# Patient Record
Sex: Female | Born: 1950 | Race: White
Health system: Northeastern US, Academic
[De-identification: ages and names within clinical notes are randomized; demographics above are authoritative.]

## PROBLEM LIST (undated history)

## (undated) DIAGNOSIS — I10 Essential (primary) hypertension: Secondary | ICD-10-CM

## (undated) DIAGNOSIS — M1990 Unspecified osteoarthritis, unspecified site: Secondary | ICD-10-CM

## (undated) DIAGNOSIS — G4730 Sleep apnea, unspecified: Secondary | ICD-10-CM

## (undated) DIAGNOSIS — E7800 Pure hypercholesterolemia, unspecified: Secondary | ICD-10-CM

## (undated) DIAGNOSIS — E079 Disorder of thyroid, unspecified: Secondary | ICD-10-CM

## (undated) DIAGNOSIS — M797 Fibromyalgia: Secondary | ICD-10-CM

## (undated) HISTORY — PX: BREAST BIOPSY: SHX20

## (undated) HISTORY — PX: CARPAL TUNNEL RELEASE: SHX101

## (undated) HISTORY — PX: MEDIAL PARTIAL KNEE REPLACEMENT: SHX5965

## (undated) HISTORY — PX: ABDOMINAL HYSTERECTOMY: SHX81

## (undated) HISTORY — PX: BUNIONECTOMY: SHX129

## (undated) HISTORY — PX: HAND SURGERY: SHX662

## (undated) HISTORY — PX: OTHER SURGICAL HISTORY: SHX169

## (undated) HISTORY — PX: TONSILLECTOMY: SUR1361

## (undated) HISTORY — PX: REPLACEMENT TOTAL KNEE: SUR1224

---
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
## 1971-07-30 HISTORY — PX: OTHER SURGICAL HISTORY: SHX169

## 1999-07-30 HISTORY — PX: BLADDER REPAIR: SHX76

## 2008-02-15 DIAGNOSIS — I6529 Occlusion and stenosis of unspecified carotid artery: Secondary | ICD-10-CM | POA: Insufficient documentation

## 2008-06-22 ENCOUNTER — Encounter: Payer: Self-pay | Admitting: Cardiology

## 2011-05-29 ENCOUNTER — Encounter: Payer: Self-pay | Admitting: Orthopedic Surgery

## 2011-05-29 ENCOUNTER — Ambulatory Visit: Payer: Self-pay

## 2011-05-29 ENCOUNTER — Ambulatory Visit: Payer: Self-pay | Admitting: Orthopedic Surgery

## 2011-05-29 VITALS — BP 136/84 | Ht 64.0 in | Wt 215.0 lb

## 2011-05-29 DIAGNOSIS — M79673 Pain in unspecified foot: Secondary | ICD-10-CM

## 2011-05-29 DIAGNOSIS — M19079 Primary osteoarthritis, unspecified ankle and foot: Secondary | ICD-10-CM | POA: Insufficient documentation

## 2011-05-29 NOTE — Progress Notes (Signed)
This office note has been dictated.

## 2011-05-30 NOTE — Progress Notes (Signed)
Patient fitted with size 11 lynco inserts and left carbon fiber footplate.

## 2011-05-30 NOTE — H&P (Signed)
PATIENT'S PROBLEM:  Left midfoot pain.    HISTORY OF PRESENT ILLNESS:  Patient is a 60 year old woman, a patient of  Dr. Consuelo Pandy, who has severe pain in both feet, the left worse  than the right, primarily in the area of the midfoot.  She has noted a  progressive flat-foot deformity on the left side.  She has sharp, burning  pain.  She also gets some numbness into her toes.  She describes the pain  as 7/10 now and at its worst 10/10.  She has tried ice, nonsteroidal  antiinflammatories, and physical therapy exercises.  This has been coming  on for the last year.    HISTORY:  Her past medical history, medications, surgeries, allergies,  social history, and family history were all reviewed with the patient and  included in the patient's chart.    PHYSICAL EXAMINATION:  Patient is neurovascularly intact.  Her maximal area  of tenderness is in the left tarsometatarsal joint.  She has pes planus  foot architecture, with some collapse to the midfoot.  On the right side  she has similar location of tenderness, although the flatfoot deformity is  not as severe.  Her ankle motion appears relatively well maintained.  She  does not have a post Silfverskiold test.  Her strength is 5/5 in bilateral  extremities in all major muscle groups.    IMAGING:  X-rays of her left foot demonstrate midfoot arthritis, as do the  right foot.    ASSESSMENT:  Left and right midfoot arthritis:  I am going to try a carbon  footplate on her left side, as well as Lynco on both sides, and see if that  helps her.  I will see her back in 6 weeks.                    Electronically Signed and Finalized by  Zachary George, MD 06/17/2011 16:39  ___________________________________________  Zachary George, MD  DD:   05/29/2011  DT:   05/30/2011 11:25 A  RUE/AV#[id number]  [id number]    cc:   Ron Agee, MD

## 2011-07-11 ENCOUNTER — Encounter: Payer: Self-pay | Admitting: Orthopedic Surgery

## 2011-07-11 ENCOUNTER — Ambulatory Visit: Payer: Self-pay | Admitting: Orthopedic Surgery

## 2011-07-11 VITALS — BP 162/80 | HR 87 | Ht 64.0 in | Wt 215.0 lb

## 2011-07-11 DIAGNOSIS — G629 Polyneuropathy, unspecified: Secondary | ICD-10-CM

## 2011-07-11 DIAGNOSIS — M19079 Primary osteoarthritis, unspecified ankle and foot: Secondary | ICD-10-CM

## 2011-07-11 MED ORDER — LIDOCAINE HCL 2 % EX JELLY WRAPPED *I*
CUTANEOUS | Status: AC | PRN
Start: 2011-07-11 — End: ?

## 2011-07-11 MED ORDER — PREGABALIN 75 MG PO CAPS *A*
75.0000 mg | ORAL_CAPSULE | Freq: Two times a day (BID) | ORAL | Status: AC
Start: 2011-07-11 — End: 2011-08-10

## 2012-01-15 DIAGNOSIS — I10 Essential (primary) hypertension: Secondary | ICD-10-CM | POA: Insufficient documentation

## 2012-10-10 DIAGNOSIS — E785 Hyperlipidemia, unspecified: Secondary | ICD-10-CM | POA: Insufficient documentation

## 2012-10-10 DIAGNOSIS — K219 Gastro-esophageal reflux disease without esophagitis: Secondary | ICD-10-CM | POA: Insufficient documentation

## 2013-10-18 ENCOUNTER — Encounter: Payer: Self-pay | Admitting: Gastroenterology

## 2013-11-22 ENCOUNTER — Other Ambulatory Visit: Payer: Self-pay | Admitting: Gastroenterology

## 2013-11-22 ENCOUNTER — Encounter: Payer: Self-pay | Admitting: Orthopedic Surgery

## 2013-11-22 ENCOUNTER — Ambulatory Visit: Payer: Self-pay | Admitting: Orthopedic Surgery

## 2013-11-22 VITALS — BP 138/62 | HR 80 | Ht 64.0 in | Wt 210.0 lb

## 2013-11-22 DIAGNOSIS — M76822 Posterior tibial tendinitis, left leg: Secondary | ICD-10-CM

## 2013-11-22 DIAGNOSIS — M76829 Posterior tibial tendinitis, unspecified leg: Secondary | ICD-10-CM | POA: Insufficient documentation

## 2013-11-22 NOTE — Progress Notes (Signed)
Walk-in Visit    Patient name: Rachel House     There were no vitals filed for this visit.    Laterality: left    Device:  [place]port Ankle    Functional Goals: Immobilization/Reduce joint motion    ROM settings: Not Applicable    Expected Frequency / Duration of Use:   Per physician orders    Brand: aircast                 Model: Not Applicable    Size: medium    Modifications made: Not Applicable    Complexity:   Fitting was routine, requiring little to no adjustments    Yes Device was inspected for safety/security and found to be functioning properly    Ordered/Delivered: Device Delivered    The patient given verbal instructions.  The following Instructions were reviewed:   Purpose of device, Cleaning / Care of device, Potential Risks / Benefits, Frequency / Duration of use and How to report potential failure / malfunctions    Device Comfort Score: NA

## 2013-11-23 ENCOUNTER — Telehealth: Payer: Self-pay

## 2013-11-23 DIAGNOSIS — M25579 Pain in unspecified ankle and joints of unspecified foot: Secondary | ICD-10-CM

## 2013-11-23 NOTE — Telephone Encounter (Signed)
Please sign RX for LT AIRSPORT ANKLE BRACE [phone]

## 2013-11-23 NOTE — Progress Notes (Signed)
Rachel Clay:   House, Rachel House MR #:  [id number]   ACCOUNT #:  [id number] DOB:  1950-11-29   DICTATED BY:  Zachary GeorgeJudith F Evita Merida, MD DATE OF VISIT:  11/22/2013     PROBLEM:  Left posterior tibial tendonitis.    HISTORY OF PRESENT ILLNESS:  The patient is a day 63 year old woman who was previously seen in 2013 for bilateral midfoot arthritis and peripheral neuropathy. Was down in [place] and had progressive pain in the region of her left medial ankle.  She was seen by a podiatrist who sent her to get an MRI which documented a posterior tibial tendon degenerative tear.  She is treated with some strapping of her foot and ankle as she did not want any surgery done in [place] and returns here for evaluation.  Her past medical history, medications, surgeries, allergies, social history, and family history are all reviewed with the patient and included in the patient's chart.    PHYSICAL EXAMINATION:  She has a swollen tender area of her posterior tib tendon.  She is not able to actively invert.  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  She has minimal tenderness to palpation in the sinus tarsi.  Her ankle motion is well maintained.    IMAGING:  Review of patient's left MRI that the patient brought with her demonstrates a degenerative tear of the posterior tib tendon.    ASSESSMENT:  Left posterior tibial tendonitis with a degenerative tear:  I told the patient we could try organizing her with an ankle brace to see if would be more comfortable and I will see her back in a month.  If we find that the ankle brace by itself is more comfortable to her and it helps with some of the swelling, then we can utilize it for a couple more months and then transition into a UCBL-ish type orthotic.  If she finds that the brace itself is not comfortable enough, we may want to consider either an alternative brace wear or surgical intervention and it may in fact be a triple arthrodesis.  I did not get into the  surgical options with this patient as she has not really exhausted her nonoperative options at this time and we will see her back in a month.             ______________________________  Zachary GeorgeJudith F Jeziah Kretschmer, MD    JFB/MODL  DD:  11/22/2013 18:24:02  DT:  11/23/2013 04:54:[id number]:38:[id number]  Job #:  [id number]    cc: Zachary GeorgeJudith F Jermell Holeman, MD   Ron Ageeichard F Mittereder, MD   [address]   [place], [place] [id number]

## 2014-01-06 ENCOUNTER — Ambulatory Visit: Payer: Self-pay | Admitting: Orthopedic Surgery

## 2014-01-06 ENCOUNTER — Encounter: Payer: Self-pay | Admitting: Orthopedic Surgery

## 2014-01-06 VITALS — BP 166/90 | Ht 64.0 in | Wt 210.0 lb

## 2014-01-06 DIAGNOSIS — M19079 Primary osteoarthritis, unspecified ankle and foot: Secondary | ICD-10-CM

## 2014-01-06 DIAGNOSIS — M76822 Posterior tibial tendinitis, left leg: Secondary | ICD-10-CM

## 2014-01-06 NOTE — Progress Notes (Signed)
Patient was seen in clinic today and molded for a left modified UCBL for PTTI.  This was done without incident.  She was instructed on shoe wear, cost and fabrication time of the orthotic.  She wishes to proceed at this time.  She will benefit from the increased support that the orthotic will provide.    Follow up in three weeks for fit and delivery of foot orthotic.    All questions were answered.

## 2014-01-07 ENCOUNTER — Telehealth: Payer: Self-pay

## 2014-01-07 DIAGNOSIS — M25579 Pain in unspecified ankle and joints of unspecified foot: Secondary | ICD-10-CM

## 2014-01-07 NOTE — Progress Notes (Signed)
PROBLEM:  Left posterior tibial tendonitis.    HISTORY OF PRESENT ILLNESS:  The patient is a day 63 year old woman who was previously seen in 2013 for bilateral midfoot arthritis and peripheral neuropathy. Was down in [place] and had progressive pain in the region of her left medial ankle.  She was seen by a podiatrist who sent her to get an MRI which documented a posterior tibial tendon degenerative tear.  She is treated with some strapping of her foot and ankle as she did not want any surgery done in [place] and returns here for evaluation. Continuing to have left medial ankle pain.      PHYSICAL EXAMINATION:  She has a swollen tender area of her posterior tib tendon.  She is not able to actively invert.  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  She has minimal tenderness to palpation in the sinus tarsi.  Her ankle motion is well maintained.    IMAGING:  Review of patient's left MRI that the patient brought with her demonstrates a degenerative tear of the posterior tib tendon.    ASSESSMENT:  Left posterior tibial tendonitis with a degenerative tear:  The patient reports the ankle brace, did not make any difference with her pain. May need to try aUCBL-ish type orthotic. Discussed with The patient about her surgical options such as a triple arthrodesis.  Risks and benefits, reviewed with patient.  She will Call the office if she wants to pursue the surgical option.

## 2014-01-07 NOTE — Telephone Encounter (Signed)
Left FO, plastizote or equal, ea  Dx [phone]

## 2014-01-11 ENCOUNTER — Emergency Department
Admission: EM | Admit: 2014-01-11 | Discharge: 2014-01-11 | Disposition: A | Discharge status: Home / Self Care | Payer: Self-pay | Source: Ambulatory Visit | Attending: Emergency Medicine | Admitting: Emergency Medicine

## 2014-01-11 DIAGNOSIS — IMO0002 Reserved for concepts with insufficient information to code with codable children: Secondary | ICD-10-CM

## 2014-01-11 DIAGNOSIS — S76319A Strain of muscle, fascia and tendon of the posterior muscle group at thigh level, unspecified thigh, initial encounter: Secondary | ICD-10-CM

## 2014-01-11 HISTORY — DX: Unspecified osteoarthritis, unspecified site: M19.90

## 2014-01-11 HISTORY — DX: Disorder of thyroid, unspecified: E07.9

## 2014-01-11 HISTORY — DX: Essential (primary) hypertension: I10

## 2014-01-11 HISTORY — DX: Pure hypercholesterolemia, unspecified: E78.00

## 2014-01-11 HISTORY — DX: Fibromyalgia: M79.7

## 2014-01-11 LAB — HM HIV SCREENING OFFERED

## 2014-01-11 NOTE — ED Notes (Addendum)
Plan of Care: Received pt with complaint as noted by Triage.  Will monitor for VS stability and pain assessment as appropriate.  Will work with provider and pt (and family) toward suitable discharge plan.    Pt presents with muscular pain in right hamstring after fall this morning while walking dog.  No obvious injury; minor swelling bu comparison to left.  Pt is mostly concerned about the possibility of a blood clot only because she has poor vasculature; no history of clots or anticoags.

## 2014-01-11 NOTE — ED Provider Notes (Signed)
History     Chief Complaint   Patient presents with    Leg Pain       HPI Comments: 63 year old female, walking her 7lb dog when she slipped on wood in the backyard. She slipped with her right leg extended out. Suddenly felt extreme pain in her posterior medial thigh. Did not hit any objects.  Did not hit head or lose consciousness.  Denies chest pain or shortness of breath.  No abdominal pain.  After falling became slightly nauseous and lightheaded which she attributes to pain.  She did not have the symptoms prior to her fall.  No numbness or tingling in her foot.  Took 800 mg ibuprofen prior to arrival and does not want any narcotic pain medications at the present time.      History provided by:  Patient and medical records      Past Medical History   Diagnosis Date    Arthritis     Hypertension     Thyroid disease     High cholesterol     Fibromyalgia             Past Surgical History   Procedure Laterality Date    Hysterectomy      Bladder repair      Knee replacement Left     Feet Bilateral        History reviewed. No pertinent family history.      Social History      reports that she has never smoked. She does not have any smokeless tobacco history on file. She reports that she drinks alcohol. She reports that she does not use illicit drugs. Her sexual activity history is not on file.    Living Situation    Questions Responses    Patient lives with     Homeless     Caregiver for other family member     External Services     Employment     Domestic Violence Risk           Problem List     Patient Active Problem List   Diagnosis Code    Arthritis of foot 716.97    Posterior tibial tendonitis 726.72       Review of Systems   Review of Systems   Constitutional: Negative for fever and chills.   HENT: Negative for trouble swallowing.    Respiratory: Negative for shortness of breath.    Cardiovascular: Negative for chest pain.   Gastrointestinal: Positive for nausea. Negative for vomiting, abdominal  pain and abdominal distention.   Genitourinary: Negative for flank pain.   Musculoskeletal: Negative for back pain and neck pain.        Right thigh pain posteriorly   Skin: Negative for wound.   Neurological: Negative for syncope.       Physical Exam     ED Triage Vitals   BP Heart Rate Heart Rate(via Pulse Ox) Resp Temp Temp Source SpO2 O2 Device O2 Flow Rate   01/11/14 1204 01/11/14 1204 -- 01/11/14 1204 01/11/14 1204 01/11/14 1204 01/11/14 1204 01/11/14 1204 --   141/62 mmHg 102  18 36.5 C (97.7 F) TEMPORAL 97 % None (Room air)       Weight           01/11/14 1204           95.255 kg (210 lb)               Physical Exam  Constitutional: She is oriented to person, place, and time. She appears well-developed and well-nourished. No distress.   HENT:   Head: Normocephalic and atraumatic.   Eyes: Pupils are equal, round, and reactive to light. No scleral icterus.   Neck: No tracheal deviation present.   Cardiovascular: Normal rate and regular rhythm.    Palpable DP and PT in bilateral lower extremities.  Palpable radial pulses bilateral upper extremities.   Pulmonary/Chest: Effort normal and breath sounds normal. No respiratory distress. She has no wheezes. She has no rales.   Abdominal: Soft. Bowel sounds are normal. She exhibits no distension. There is no tenderness. There is no rebound and no guarding.   Musculoskeletal:   No gross bony deformities.  No deformities or tenderness to palpation from the knees distally bilaterally.  There is tenderness to palpation in the posterior medial aspect of the thigh.  The area which is tender is not firm.   Neurological: She is alert and oriented to person, place, and time.   No sensation deficits in the lower extremities.  Able to flex and extend with normal strength in bilateral lower extremities at the knee.   Skin: She is not diaphoretic.   Nursing note and vitals reviewed.      Medical Decision Making      Amount and/or Complexity of Data Reviewed  Tests in the  radiology section of CPT: ordered and reviewed  Independent visualization of images, tracings, or specimens: yes        Initial Evaluation:  ED First Provider Contact    Date/Time Event User Comments    01/11/14 1209 ED Provider First Contact Edy Belt Initial Face to Face Provider Contact          Patient seen by me as above    Assessment:  63 y.o., female comes to the ED with history of a mechanical fall in the backyard with pain in the posterior aspect of her right thigh and no other injuries noted by physical exam.  CMS is intact.  Unlikely to have intracranial spinal injury.  Currently hemodynamically stable.    Differential Diagnosis includes femur fracture, hip fracture, muscle strain, soft tissue hematoma                Plan:   - X-ray of the right femur and right hip  - Pain medications will be administered once patient asks for them.  She refused them when I initially offered.    Noberto RetortJoseph Stace Peace, MD                Noberto RetortHayek, Angelamarie Avakian, MD  01/11/14 [id number]

## 2014-01-11 NOTE — ED Notes (Signed)
Pt states "   I fell at 0930 in my yard, I had slippers on and slipped and my right leg went under me."  C/ o pain in right posterior upper leg.  Pt is walking with cane d/t this fall.

## 2014-01-11 NOTE — ED Provider Progress Notes (Signed)
ED Provider Progress Note    No hip or femur fracture. Patient able to tolerate weight on leg despite pain. Symptoms consistent with sprain/strain or hematoma. Currently no sign of compartment syndrome. Will discharge home       Noberto RetortJoseph Romona Murdy, MD, 01/11/2014, 1:16 PM

## 2014-02-03 ENCOUNTER — Ambulatory Visit: Payer: Self-pay

## 2014-02-03 DIAGNOSIS — M76822 Posterior tibial tendinitis, left leg: Secondary | ICD-10-CM

## 2014-02-03 DIAGNOSIS — M79673 Pain in unspecified foot: Secondary | ICD-10-CM

## 2014-02-03 NOTE — Patient Instructions (Signed)
Foot Orthosis Wearing Instructions:    These instructions are intended to help ensure successful use of your Foot Orthosis (FO). Please read and follow these instructions carefully.  While being fitted with your FO, your Orthotist will instruct you on how to wear and use it effectively.  Some points to remember:    Socks:   1. Always wear clean, non-elastic cotton socks with your shoes.  Shoes  1. Appropriate shoes are vital to the success of your inserts.   Wear only shoe with Laces (preferred) or velcro closures: no loafers or slip-ons.  Make sure laces are comfortably snug to anchor your feet to the shoe so that they work together as one unit.  2. It may be necessary to get a larger shoe, anywhere from ½ to a 1½ size larger than your current shoe to accommodate the increased thickness of the insert.    3. Running shoes with a mild rocker bottom and/or wide shoes with a high toe box, and/or removable insoles are optimal.  If your shoes need to have modifications to the sole (lifts or wedges), look for soles made of a solid material (no air or gel soles).  Notify the sales clerk of what you are looking for and get their permission to return the shoes if, after our inspection, we find that we are not able to work with the sole materials.  Wear and Care  1. It is important for everyone to watch for warning signs of ill-fitting FO’s and/or shoes.  2. It is especially critical for people with diabetes, neuropathy or arthritis to vigilantly follow these fundamentals  a. Break in your new FO by wearing it 1-2 hours for you first day then gently increase wear time by and hour or two until you are up to a full day in (approximately) 1- 2 weeks.  b. Every time you remove your shoes and the FO it is vital to check your skin for redness, blisters or pressure points.    i. If you see any reddened areas and you are diabetic or have neuropathy discontinue wearing the FO and contact our office immediately.  ii. If you have  blisters contact our office immediately.  iii. If you are not diabetic, or have neuropathy, and do not have blisters, confirm that any reddened areas disappears within 30 minutes prior to reapplying the FO’s and increasing wear time.  3. The orthosis may be gently wiped clean (inside and out) with a soft cloth using water and a mild detergent.  Rinse thoroughly and towel dry.  It can be immediately placed back into you shoe and worn.  The material will not absorb any liquid.    Compliance with instructions and follow-up appointments are very important.  At your follow-up appointment your Orthotist will make any necessary ‘fine-tuning’ adjustments to optimize your comfort and function.  It is normal for adjustments to be made to optimize the fit and function of your Shoe Inserts.    If you have any question or concerns about your shoes or foot problems, please contact [facility], at [phone].  Your foot health and comfort are very important to us.    I have been provided a copy of these recommendations.

## 2014-02-08 NOTE — Progress Notes (Signed)
FOOT ORTHOSIS                                                                           FIT AND DELIVERY    Rachel ClayDenise O House                                                                                                                  02/03/14  [id number]                                                                                              House, Rachel JunkerJudith F                PURPOSE: The patient presents to today's appointment for delivery of: Left modified UCBL.    SUBJECTIVE: patient has diagnosis of left pes planus and PTTD.     OBJECTIVE:Contours appropriately match patient's foot.    Overall length of foot orthoses were trimmed EA:VWUJto:Full.      The patient was provided with:left modified UCBL.Marland Kitchen.    Modifications performed:   Left foot wedge medial    Patient was able to ambulate comfortably in exam room/hallway after the following modifications: no change    ASSESSMENT:  Fit and function are appropriate at this time based upon:  -  Patient feedback  -  Skin inspection: no areas of concern, skin free of ecchymosis, erythema, and callosity.  -  Gait assessment:  free of significant compensations    Patient to continue full time daily use of device as prescribed to maximize functional ambulation.  X Device was inspected for safety and security and found to be functioning properly    The patient given verbal and written instructions.  The following Instructions were reviewed:   XPurpose of device   xCleaning / care of device   XPotential Risks / Benefits   XFrequency / Duration of use   XHow to report potential failure / malfunctions    Patient will benefit from increased hindfoot control to reduce excessive pronation and stress on PTT.    PLAN:   Follow up in prn.    Rachel NipAlicia Jood House  02/03/14

## 2014-02-14 ENCOUNTER — Encounter: Payer: Self-pay | Admitting: Gastroenterology

## 2014-02-25 ENCOUNTER — Encounter: Payer: Self-pay | Admitting: Gastroenterology

## 2014-02-28 ENCOUNTER — Ambulatory Visit: Payer: Self-pay | Admitting: Obstetrics and Gynecology

## 2014-02-28 ENCOUNTER — Encounter: Payer: Self-pay | Admitting: Obstetrics and Gynecology

## 2014-02-28 VITALS — BP 130/88 | Ht 64.5 in | Wt 215.0 lb

## 2014-02-28 DIAGNOSIS — R3915 Urgency of urination: Secondary | ICD-10-CM

## 2014-02-28 DIAGNOSIS — N8111 Cystocele, midline: Secondary | ICD-10-CM

## 2014-02-28 NOTE — H&P (Signed)
 UROGYNECOLOGY NEW PATIENT VISIT    Chief Complaint:  Chief Complaint   Patient presents with   . New Patient Visit     prolapse/uti's/urge incontinence       History of Present Illness:    Rachel House is a 63 y.o. woman who is presenting for an initial office evaluation with complaints of recurrent UTIs and prolapse, worsening for the past 8 years. Rachel House has a past surgical history that includes two prolapse repairs. She reports a surgery done in 1990s for dropped bladder that involved stitches but is unsure of the name of the procedure. There was recurrence following that was managed with a pessary until she had a surgical repair in 2011. She says this second operation was done through the abdomen with a supracervical hysterectomy that involved mesh (possibly sacrocolpopexy). She now feels a golf ball size bulge whenever she wipes. She denies pain with bulge or pain with intercourse. In terms of her recurrent UTIs, she is on maintenance Macrobid for the last year and has had 4 UTIs in the past year. She reports mainly urgency symptoms and gets up 1-2 times a night to void. She does not have any leakage at night. During her two day voiding diary, she did not have any leaks at all. She can hold her bladder most of the time on the way to the bathroom but is mainly bothered by the urgency symptoms. She drinks 24 oz in two hours.    Of note, she was referred by two of her friends who have been seen by Dr. Ivery House.    Void Diary:  # voids per 24 hr: 8  Amount of void range: 2-18 oz  Ave void volume: 8 oz  Void interval range: 1 to 5  hours  Nocturia episodes: 1-2   Intake/24hr: 86 oz to 103 oz  Output/24hr: 67 oz  Bladder Irritants: coffee, wine  # incontinence epidoses/24 hr:  None for this voiding diary.       Current Medical Problems  Patient Active Problem List   Diagnosis Code   . Arthritis of foot 716.97   . Posterior tibial tendonitis 726.72       Past Medical History:   Past Medical History   Diagnosis  Date   . Arthritis    . Hypertension    . Thyroid disease    . High cholesterol    . Fibromyalgia        Past Surgical History:  Past Surgical History   Procedure Laterality Date   . Hysterectomy     . Bladder repair     . Knee replacement Left    . Feet Bilateral        Allergies:    Codeine; Contrast dye; Darvon compound; Morphine; Phenobarbital; Seasonal; Sulfa drugs; Vicodin; and No known latex allergy    Current medications:    Current Outpatient Prescriptions   Medication   . nitrofurantoin monohydrate macrocrystal (MACROBID) 100 MG capsule   . lisinopril (PRINIVIL,ZESTRIL) 10 MG tablet   . levothyroxine (SYNTHROID, LEVOTHROID) 75 MCG tablet   . diclofenac potassium (CATAFLAM) 50 MG tablet   . PREVALITE 4 GM/DOSE powder   . Magnesium 500 MG CAPS   . diclofenac (VOLTAREN) 50 MG EC tablet   . Omega-3 Fatty Acids (FISH OIL) 1000 MG CAPS   . Coenzyme Q-10 200 MG CAPS   . Red Yeast Rice 600 MG TABS   . pantoprazole (PROTONIX) 20 MG EC tablet   . lisinopril (  ZESTRIL) 5 MG tablet   . Ascorbic Acid (VITAMIN C) 500 MG tablet   . Cholecalciferol (VITAMIN D) 2000 UNITS tablet   . celecoxib (CELEBREX) 200 MG capsule   . lidocaine (XYLOCAINE) 2 % jelly   . Thiamine HCl (VITAMIN B-1) 100 MG tablet   . Multiple Vitamins-Minerals (MEGA 2000 PO)   . Ginkgo Biloba 120 MG CAPS   . Cranberry 400 MG CAPS     No current facility-administered medications for this visit.       Family History:    No family history on file.    Social/Occupational History:   History     Social History   . Marital Status: Married     Spouse Name: N/A     Number of Children: N/A   . Years of Education: N/A     Occupational History   . Not on file.     Social History Main Topics   . Smoking status: Never Smoker    . Smokeless tobacco: Not on file   . Alcohol Use: Yes      Comment: social   . Drug Use: No   . Sexual Activity: Not on file     Other Topics Concern   . Not on file     Social History Narrative       Review of Systems:    General: No fatigue.   No weight gain.  No weight loss.  Positive for night sweats and weakness.  No fevers.  HEENT: No hearing loss.  Positive for blurry vision.  No oral sores. No difficulty swallowing.  No problems with sense of smell.  Respiratory: No shortness of breath. No cough.  No wheezing.  Cardiovascular: No palpitations.  No  syncope.  No chest pain.  Gastrointestinal: No abdominal pain. No bloating.  No change in stool color or caliber.  No chronic diarrhea.  No constipation.  No nausea. No vomiting.  Genitourinary: As per HPI.  Endocrine: No polyuria. No polydipsia.    Hematologic: No epistaxis. No easy bruising.     No allergies. No immune diseases.  Musculoskeletal System: Positive for joint pain.  No swelling in her legs.  Integumentary System: No skin rashes. No lesions.  No easy bruising. No bleeding.   Breast: No breast discharge. No breast pain.  Neurologic: No headaches. No numbness or weakness to extremities. Positive for dizziness, numbness.   No seizures.    Psychiatric: No depression.  No anxiety.  No other psychiatric illnesses.    Vital Signs:   Filed Vitals:    02/28/14 0940   BP: 130/88   Height: 1.638 m (5' 4.5")   Weight: 97.523 kg (215 lb)     Body mass index is 36.35 kg/(m^2).    PHYSICAL EXAM:  General: No apparent distress. Alert & oriented x 3. Healthy-appearing woman who looks her stated age.    CV: S1-S2, regular.    Respiratory: Clear to auscultation bilaterally.    Abdomen: Soft.  Nontender.  Nondistended.  No masses.  No hernias.   Vulva: Normal external genitalia.  Symmetric labia.  No skin lesions or pigmentation changes.  Normal external urethral meatus.    Vagina: No epithelial lesions or pigmentation changes.  No tenderness with palpation underneath the bladder or urethra.    No tenderness with palpation over the pelvic floor muscles.   Cystocele: Stage 2  Rectocele: Not present  Apical Descensus: Not present  Enterocele: None  POP-Q Measurements: Aa = +1. Ba = +  1. Ap =-3.  Bp =-3.  C =Not  measured .  D = -9.  TV L = 10.  Uterus: Surgically absent. Positive cervical remant left. No mesh palpated at cuff.   Adnexa: No masses.  Nontender.  Rectal Exam: No external hemorrhoidal tissue. No fissures.  No rectal mucosal prolapse visible.    Neurologic Exam:  Motor and sensory exams grossly normal.    Cough Stress Test: Negative  Urethral Mobility: None   Void: 400 cc in toilet  Postvoid Residual Volume: 120 cc on  Initial scan. Patient asked to void again with additional 75 cc in toilet and 71 cc on bladder scanner.     POCT: Negative        Assessment:     Rachel House is a 63 y.o. with the following:      1. Urge incontinence at high bladder volume  -Large fluid intake   -CST negative.   -No stress incontinence by history.     2. Vaginal Prolapse  -Stage 2 cystocele due to a midline defect. No apical defect or descensus present.   -No rectocele.  -s/p two prolapse surgeries. Second surgery most likely sacrocolpopexy.   -No retention. PVR 71 cc on second bladder scan      3. Recurrent UTIs  -Will need records from previous cultures.  -Urine dip negative today.  -Daily Macrobid prophylaxis.      Plan:   The findings on exam and the following recommendations were reviewed with the patient:    1. We reviewed the patient's history, symptoms, and the findings of today's examination. We reviewed the difference between urge and stress urinary incontinence. For her urge related symptoms, we discussed starting with behavioral management. She appears to have a large amount of fluid intake. She drinks 24 oz in 2 hours in the mornings and occasionally drinks coffee and alcohol. We recommended timed voids, limitation of fluids to 80 oz a day and fluid restriction before she leaves the house.    She is planning to implement behavioral management rules for now. All of the patient's questions were answered and she indicated that she felt comfortable with our discussion and plan. The patient was asked to complete a  voiding diary prior to her return visit.    2. We discussed her recurrent prolapse and findings of stage 2 cystocele with mostly midline defect. Apical support is present. She does not have urinary retention. Dennie Bible

## 2014-03-02 ENCOUNTER — Encounter: Payer: Self-pay | Admitting: Gastroenterology

## 2014-03-31 ENCOUNTER — Ambulatory Visit: Payer: Self-pay | Admitting: Obstetrics and Gynecology

## 2014-03-31 ENCOUNTER — Encounter: Payer: Self-pay | Admitting: Obstetrics and Gynecology

## 2014-03-31 ENCOUNTER — Ambulatory Visit: Payer: Self-pay | Admitting: Gynecology

## 2014-03-31 VITALS — BP 126/80 | Ht 64.0 in | Wt 213.0 lb

## 2014-03-31 DIAGNOSIS — N8110 Cystocele, unspecified: Secondary | ICD-10-CM | POA: Insufficient documentation

## 2014-03-31 DIAGNOSIS — Z87440 Personal history of urinary (tract) infections: Secondary | ICD-10-CM

## 2014-03-31 DIAGNOSIS — R3915 Urgency of urination: Secondary | ICD-10-CM | POA: Insufficient documentation

## 2014-03-31 NOTE — Progress Notes (Signed)
CC: Pt worried about UTIs    HPI: Rachel House is a 63 y.o. woman being followed for her urinary urgency, frequent urinary tract infections and a stage II cystocele.  At her last visit, behavioral modifications were discussed.  The patient has not decreased her amount of fluid intake because she is much more worried about her urinary tract infections and she is urinary urgency.  She is currently taking Macrobid 100 mg daily in order to prevent her bladder infections.  She has been on this medication since January.  The patient no longer wants to take this medication because it causes her over $100 a month and she continues to have breakthrough urinary tract infections.  Her most recent urinary tract infection was in July.  On chart review, the patient has also had a supracervical hysterectomy with sacral colpopexy and TVT.  The patient is worried that the mesh labia rotating into her bladder or causing these infections.  She does not feel as if she has a bladder infection today.    On chart review:  May 25, 2013 Klebsiella, resistant to ampicillin and Macrobid, intermediate Cipro  February 11, 2013  Klebsiella, resistant to ampicillin and Macrobid, intermediate to Augmentin  July 02, 2012 E. coli, resistant to ampicillin, Augmentin, ciprofloxacin, gentamicin, levofloxacin, tetracycline and Bactrim    Filed Vitals:    03/31/14 0906   BP: 126/80   Height: 1.626 m ( )   Weight: 96.616 kg (213 lb)     Gen- well appearing, NAD  Ext- no edema    Assessment:  1.  Frequent urinary tract infections  -- prophylactic Macrobid 100 mg daily  --Breakthrough urinary tract infections resistant to Macrobid  2.  Pelvic organ prolapse  --Stage II cystocele, asymptomatic  --Status post supracervical hysterectomy, sacral colpopexy  3.  Urinary incontinence, resolved  --Status post TVT  --Urinary urgency, no incontinence    Plan:  1.  Because the patient is most worried about her urinary tract infections and is not interested in  taking Macrobid daily, I recommended that she stop taking prophylactic antibiotics.  The patient is having breakthrough urinary tract infections while being on Macrobid.  In addition, looking at the urine cultures that are reviewed, the infections were resistant to Macrobid.  I offered to place the patient on another prophylactic antibiotic but this was declined.  We discussed taking cranberry extract 400 mg daily.  The patient is interested in alternative medicine and would like to take the supplement.  I also talked to her about taking D-mannose in addition to the cranberry extract.  We reviewed the probability that vaginal atrophy is contributing to her frequent urinary tract infection.  I strongly recommended that she start using vaginal estrogen cream.  We discussed how to use it and when to use it.  We also discussed the small risk of developing breast cancer.  The patient expressed understanding and will start using vaginal estrogen cream.  She was given a sample at her last visit.  I also recommended that she have a cystoscopy done.  Given her history of 2 surgeries involving the mesh, urinary urgency and urinary tract infections, it is more than reasonable to look inside the patient's bladder to rule out the possibility of foreign material within the bladder.  The patient expressed understanding.  She'll return to clinic with Dr. Ivery Quale or myself for a cystoscopy.  All the patient's questions were answered.  She expressed understanding and satisfaction with the plan.  She was  given educational material on the following: Frequent urinary tract infections, vulvovaginal atrophy, anatomical drawing and estrogen cream use.  She declined having a urine sample sent for culture today.

## 2014-04-06 ENCOUNTER — Telehealth: Payer: Self-pay

## 2014-04-06 ENCOUNTER — Encounter: Payer: Self-pay | Admitting: Obstetrics and Gynecology

## 2014-04-06 ENCOUNTER — Ambulatory Visit: Payer: Self-pay | Admitting: Obstetrics and Gynecology

## 2014-04-06 VITALS — BP 130/76 | Temp 98.0°F | Ht 64.0 in | Wt 210.0 lb

## 2014-04-06 DIAGNOSIS — N3090 Cystitis, unspecified without hematuria: Secondary | ICD-10-CM

## 2014-04-06 DIAGNOSIS — N390 Urinary tract infection, site not specified: Secondary | ICD-10-CM

## 2014-04-06 MED ORDER — CIPROFLOXACIN HCL 500 MG PO TABS *I*
500.0000 mg | ORAL_TABLET | Freq: Two times a day (BID) | ORAL | Status: AC
Start: 2014-04-06 — End: 2014-04-11

## 2014-04-06 NOTE — Progress Notes (Signed)
CC: UTI    HPI: Rachel House is a 63 y.o. woman being followed for frequent urinary tract infections and urinary urgency.  The patient was on prophylactic Macrobid, however the patient was having breakthrough urinary tract infections.  She stopped taking the Macrobid due to ineffectiveness and cost.  The patient has scheduled a cystoscopy to be done as she is a history of a sacrocolpopexy and TVT.  She's been having symptoms of urinary frequency, lower back pain and bladder pain since Monday.  She is leaving town on Friday and is concerned that she has a urinary tract infection.    Filed Vitals:    04/06/14 1202   BP: 130/76   Temp: 36.7 C (98 F)   Height: 1.626 m ( )   Weight: 95.255 kg (210 lb)     Gen- well appearing, NAD  Back- no CVA tenderness  Abdomen: Soft.  Nontender.  Nondistended.    Vulvar: Normal external genitalia.  Symmetric labia.    Vagina: No epithelial lesions or pigmentation changes.      LABORATORY: Because she reports difficulty collecting a clean urine sample, a catheterized urine sample was obtained.  She voided 100 cc.  Using sterile technique and a # 8 pediatric catheter, we catheterized her for approximately 150 cc of urine.    Procedure: Lidocaine instillation.  A verbal consent was obtained from the patient. The urethral meatus was prepped with iodine swab.  A # 8 [place] pediatric catheter was introduced into the bladder and was emptied completely.  A mixture containing 20 cc of lidocaine was instilled into the bladder.  The patient reported no change in her pain.    Assessment:  1. Frequent urinary tract infections  --s/p prophylactic Macrobid 100 mg daily, breakthrough urinary tract infections resistant to [facility]  May 25, 2013 Klebsiella, resistant to ampicillin and Macrobid, intermediate Cipro  February 11, 2013 Klebsiella, resistant to ampicillin and Macrobid, intermediate to Augmentin  July 02, 2012 E. coli, resistant to ampicillin, Augmentin, ciprofloxacin, gentamicin,  levofloxacin, tetracycline and Bactrim  2. Pelvic organ prolapse  --Stage II cystocele, asymptomatic  --Status post supracervical hysterectomy, sacral colpopexy  3. Urinary incontinence, resolved  --Status post TVT  --Urinary urgency, no incontinence    Plan:  Catheterized urine sample was collected and will be sent for culture.  A lidocaine instillation was done for symptomatic relief, however the patient reported having the same amount of pain after the instillation.  She was given a prescription for ciprofloxacin 500 mg by mouth twice a day x5 days.  The patient was given ciprofloxacin because of her reported the symptom of back pain and she is leaving town tomorrow.  The patient is scheduled to undergo a cystoscopy to evaluate for possible mesh erosion.  This will take place next week.  I would also like to start the patient on trimethoprim 100 mg daily as prophylactic antibiotic.  This will be discussed after the cystoscopy as the patient is not interested in daily antibiotic usage.  All the patient's questions were answered.  She will sign up for "my chart" to follow the results of her urine culture.  She will call our office if her symptoms worsen.

## 2014-04-07 LAB — AEROBIC CULTURE: Aerobic Culture: 0

## 2014-04-18 ENCOUNTER — Other Ambulatory Visit: Payer: Self-pay | Admitting: Obstetrics and Gynecology

## 2014-04-20 ENCOUNTER — Encounter: Payer: Self-pay | Admitting: Obstetrics and Gynecology

## 2014-04-20 ENCOUNTER — Ambulatory Visit: Payer: Self-pay | Admitting: Obstetrics and Gynecology

## 2014-04-20 VITALS — BP 126/78 | Ht 64.0 in | Wt 215.6 lb

## 2014-04-20 DIAGNOSIS — N3090 Cystitis, unspecified without hematuria: Secondary | ICD-10-CM

## 2014-04-20 NOTE — Procedures (Signed)
PROCEDURE: CYSTOURETHROSCOPY    Urine Dipstick: SG 1.010, pH 5, leukocytes negative, nitrites negative, protein negative, glucose negative, ketones negative, urobilinogen <1, bilirubin negative, blood negative.     The risk and benefits of the procedure were explained and a written consent was obtained.The patient was positioned in the upright examination chair.  The patient's urethra was prepped in sterile fashion, and the cystourethroscope was introduced through the urethra without difficulty.  The bladder was distended using sterile normal saline to allow visualization of the entire mucosa. A 70 degree scope was introduced into the bladder with ease.    BLADDER MUCOSA: Normal.    No lesions.  No foreign bodies.  No petechiae.  No fasciculations or trabeculations.  No abnormal vasculature.  Trigone normal in appearance.  Bilateral urine jets visualized.    URETHRA: Normal.  No diverticulum.  No erythema.  No fronds.  No exudate.    BLADDER WASHINGS: Obtained.     Patient tolerated the procedure well.

## 2014-05-19 ENCOUNTER — Encounter: Payer: Self-pay | Admitting: Orthopedic Surgery

## 2014-05-19 ENCOUNTER — Ambulatory Visit: Payer: Self-pay | Admitting: Orthopedic Surgery

## 2014-05-19 VITALS — BP 142/64 | Ht 64.0 in | Wt 215.0 lb

## 2014-05-19 DIAGNOSIS — M19079 Primary osteoarthritis, unspecified ankle and foot: Secondary | ICD-10-CM

## 2014-05-19 DIAGNOSIS — M76822 Posterior tibial tendinitis, left leg: Secondary | ICD-10-CM

## 2014-05-20 ENCOUNTER — Telehealth: Payer: Self-pay

## 2014-05-20 DIAGNOSIS — M76822 Posterior tibial tendinitis, left leg: Secondary | ICD-10-CM

## 2014-05-20 NOTE — Telephone Encounter (Signed)
Left AFO, plastic, custom, (PLS), left add,soft interface, below knee   Dx [phone]

## 2014-05-20 NOTE — Progress Notes (Signed)
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    HISTORY OF PRESENT ILLNESS:  The patient is a day 63 year old woman who was previously seen  for bilateral midfoot arthritis and peripheral neuropathy. Was down in [place] and had progressive pain in the region of her left medial ankle.  She was seen by a podiatrist who sent her to get an MRI which documented a posterior tibial tendon degenerative tear. . Continuing to have left medial And hindfoot pain.      PHYSICAL EXAMINATION:  She has a swollen tender area of her posterior tib tendon.  She is not able to actively invert.  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  She has minimal tenderness to palpation in the sinus tarsi.  Her ankle motion is well maintained.    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 Drafo type bracing prescribed. Discussed with The patient about her surgical options such as a triple arthrodesis.  Risks and benefits, reviewed with patient.  She will Call the office if she wants to pursue the surgical option.She will return on an as-needed basis.

## 2014-06-02 ENCOUNTER — Ambulatory Visit: Payer: Self-pay

## 2014-06-02 DIAGNOSIS — M19079 Primary osteoarthritis, unspecified ankle and foot: Secondary | ICD-10-CM

## 2014-06-02 DIAGNOSIS — M76822 Posterior tibial tendinitis, left leg: Secondary | ICD-10-CM

## 2014-06-02 DIAGNOSIS — M2140 Flat foot [pes planus] (acquired), unspecified foot: Secondary | ICD-10-CM

## 2014-06-03 NOTE — Progress Notes (Signed)
AFO  Evaluation Form    Patient Name: Rachel House     Date: 06/03/2014  Referring MD: Dr Theador HawthorneBaumhauer     Subjective:  Pt is a 63 y.o. female with diagnosis of PTTI, pes planus and hindfoot and midfoot OA.  Prior orthotic management:left modified UCBL  Activity level: moderate   Assistive devices:  n/a  Comments/concerns: patient was unable to wear the modified UCBL due to navicular pressure but never followed up for an adjustment she will bring insert with her to her fit/delivery appt. so that I can adjust it. Also she was misquoted the co-insurance cost due to the wrong codes being submitted so she was upset/disappointed that she would have to pay almost double the amount that was originally quoted, so I did fit her with a DRAFO sport at no charge, as a service recovery, to give her some pain relief until her custom AFO is ready.     Past Surgical History   Procedure Laterality Date    Hysterectomy      Bladder repair      Knee replacement Left     Feet Bilateral      Objective:  Static weight-bearing:pes planus  Gait observations: antalgic  ROM/MMT: limited DF to just shy of neutral   Muscle Tone: good      Impression/mold taken for: DRAFO Flex AFO  Notes: patient will benefit from leather gauntlet style AFO to offer increased support/immobilization to help reduce pain/inflam. of PTT.  Assessment:  Patient to benefit from custom made AFO to: Immobilize to decrease pain and Immobilize to decrease inflammation    Plan: patient will return in 4 weeks for fit/delivery.

## 2014-06-10 ENCOUNTER — Telehealth: Payer: Self-pay

## 2014-06-30 ENCOUNTER — Telehealth: Payer: Self-pay

## 2014-07-05 ENCOUNTER — Telehealth: Payer: Self-pay

## 2014-07-05 NOTE — Telephone Encounter (Signed)
error 

## 2014-07-14 ENCOUNTER — Ambulatory Visit: Payer: Self-pay | Admitting: Obstetrics and Gynecology

## 2014-07-14 ENCOUNTER — Encounter: Payer: Self-pay | Admitting: Obstetrics and Gynecology

## 2014-07-14 VITALS — BP 128/82 | Ht 64.0 in | Wt 215.0 lb

## 2014-07-14 DIAGNOSIS — N390 Urinary tract infection, site not specified: Secondary | ICD-10-CM

## 2014-07-14 NOTE — Progress Notes (Signed)
CC: UTI    HPI: Rachel ClayDenise O House is a 63 y.o. woman being followed for frequent urinary tract infections and urinary urgency. The patient was on prophylactic Macrobid, however the patient was having breakthrough urinary tract infections. She stopped taking the Macrobid due to ineffectiveness and cost. The patient had a normal cystoscopy to be done as she has a history of a sacrocolpopexy and TVT. She's been having symptoms of urinary frequency and bladder pain since yesterday. She has been doing well since September.  This is the first time she has felt symptomatic of a UTI.    Filed Vitals:    07/14/14 1618   BP: 128/82   Height: 1.626 m (5\' 4" )   Weight: 97.523 kg (215 lb)     Gen- well appearing, NAD  Back- no CVA tenderness  Abdomen: Soft. Nontender. Nondistended.   Vulvar: Normal external genitalia. Symmetric labia.   Vagina: No epithelial lesions or pigmentation changes.     LABORATORY: Because she reports difficulty collecting a clean urine sample, a catheterized urine sample was obtained. Using sterile technique and a # 8 pediatric catheter, we catheterized her for approximately 150 cc of urine.    Procedure: Lidocaine instillation.  A verbal consent was obtained from the patient. The urethral meatus was prepped with iodine swab. A # 8 [place] pediatric catheter was introduced into the bladder and was emptied completely. A mixture containing 20 cc of lidocaine was instilled into the bladder.    Assessment:  1. Frequent urinary tract infections  --Status post normal cystoscopy  --s/p prophylactic Macrobid 100 mg daily, breakthrough urinary tract infections [facility]  April 06, 2014 negative urine culture  May 25, 2013  Klebsiella, resistant to ampicillin and Macrobid, intermediate Cipro  February 11, 2013   Klebsiella, resistant to ampicillin and Macrobid, intermediate to Augmentin  July 02, 2012  E. coli, resistant to ampicillin, Augmentin, ciprofloxacin, gentamicin, levofloxacin, tetracycline  and Bactrim  2. Pelvic organ prolapse  --Stage II cystocele, asymptomatic  --Status post supracervical hysterectomy, sacral colpopexy  3. Urinary incontinence, resolved  --Status post TVT  --Urinary urgency, no incontinence    Plan:  Catheterized urine sample was collected and will be sent for culture. A lidocaine instillation was done for symptomatic relief as this was of benefit at her last visit for symptomatic control.  She has Macrobid at home and would like to use that to treat the UTI.  I agreed and told her to take Macrobid 100mg  po bid x 5 days.  If the culture is resistant, we will switch her medication.  The Pt still is not interested in using vaginal atrophy cream.  [place] and will not return until the spring.  All of her questions were answered.  She expressed satisfaction with the plan.

## 2014-07-16 LAB — AEROBIC CULTURE

## 2014-07-18 ENCOUNTER — Telehealth: Payer: Self-pay | Admitting: Obstetrics and Gynecology

## 2014-07-18 MED ORDER — CIPROFLOXACIN HCL 250 MG PO TABS *I*
250.0000 mg | ORAL_TABLET | Freq: Two times a day (BID) | ORAL | Status: AC
Start: 2014-07-18 — End: 2014-07-25

## 2014-07-18 NOTE — Telephone Encounter (Signed)
Will change treatment to ciprofloxacin 250 mg po BID. Given prior treatment with ineffective agent will treat as complicated UTI (7 days). VM left for patient, rx sent to pharmacy.

## 2014-07-18 NOTE — Telephone Encounter (Signed)
Treated with macrobid for uti, culture resistant to macrobid. Will change to ciprofloxacin.

## 2014-07-19 ENCOUNTER — Ambulatory Visit: Payer: Self-pay

## 2014-07-19 DIAGNOSIS — M19079 Primary osteoarthritis, unspecified ankle and foot: Secondary | ICD-10-CM

## 2014-07-19 DIAGNOSIS — M76822 Posterior tibial tendinitis, left leg: Secondary | ICD-10-CM

## 2014-07-19 NOTE — Progress Notes (Signed)
PROSTHETICS and ORTHOTICS     Angelique BlonderDenise was seen today for fitting and delivery of Left drafo flex.     She reports that she is always in pain in multiple joints.     She is going to purchase new shoes. She has been told to purchase NB runners .     The DRAFO flex was donned and laced with good results.   It fit well in contour and volume.   It laced well.     She reported that it ws comfortable .     Her foot was traced on the footplate semi wt/b.   The sneaker accommodated the orthosis and foot.     She ambulated in the department and was aware of the support.     The sneaker did allow the medial collapse.     We spent the balance of the appointment making sure she understood the importance of:  * monitoring her skin  * gradual increasing her wearing time  * purchasing an appropriate shoe to further resist mid foot collapse  * that we would like to see her back for follow up in approx 2 weeks with Helmut MusterAlicia  * our 3 month warranty policy  * that she should call us with any questions or concerns b/w scheduled appts  * that she should call her dr with any issues with her ankle    She had no questions at the conclusion of today's appt.     Delivered today:  Left drafo flex purple butterfly    Adrian BlackwaterChristina Marvis Saefong, [facility]  [facility]  [phone]

## 2014-08-02 ENCOUNTER — Ambulatory Visit: Payer: Self-pay

## 2014-08-02 DIAGNOSIS — M79672 Pain in left foot: Secondary | ICD-10-CM

## 2014-08-02 DIAGNOSIS — M2140 Flat foot [pes planus] (acquired), unspecified foot: Secondary | ICD-10-CM

## 2014-08-02 DIAGNOSIS — M76822 Posterior tibial tendinitis, left leg: Secondary | ICD-10-CM

## 2014-08-02 DIAGNOSIS — M25579 Pain in unspecified ankle and joints of unspecified foot: Secondary | ICD-10-CM

## 2014-08-02 DIAGNOSIS — M19079 Primary osteoarthritis, unspecified ankle and foot: Secondary | ICD-10-CM

## 2014-08-02 NOTE — Progress Notes (Signed)
PROSTHETICS and ORTHOTICS   Rachel House is seen today for follow up of recently delivered Left DRAFO flex.   She is wearing NB walking shoes that she purchased from [employer]? The same day that she took delivery of the [facility] flex.     She reports that she did have to add a corn pad at the 5th met head as she had pressure lateral/dorsal.   She had pressure/discomfort yesterday but not before and not today at the medial malleolus.     Evaluation today reveals that :  * she is lacing it appropriately  * the volume appears to be appropriate  * there is slight pink over the dorsal/lateral 5th met head  * the corn pad is positioned appropriately    While she waited the [facility] flex was heated and relieved at the 5 th met head, the orthosis was inspected and is on good condition.     She ambulated after the changes were made with increased comfort.   No other changes were indicated today.     She understands that she should call, even if from [place], with any questions or concerns.     She will schedule a follow up when she returns from [place]?, 2016.      She had no questions at the conclusion of today's appt.     Anselm Pancoast, [facility]  [facility] O&P  [phone]

## 2014-08-11 ENCOUNTER — Ambulatory Visit: Payer: Self-pay

## 2014-08-11 DIAGNOSIS — M79672 Pain in left foot: Secondary | ICD-10-CM

## 2014-08-11 DIAGNOSIS — M25579 Pain in unspecified ankle and joints of unspecified foot: Secondary | ICD-10-CM

## 2014-08-11 DIAGNOSIS — M76822 Posterior tibial tendinitis, left leg: Secondary | ICD-10-CM

## 2014-08-11 DIAGNOSIS — M2140 Flat foot [pes planus] (acquired), unspecified foot: Secondary | ICD-10-CM

## 2014-08-11 DIAGNOSIS — M19079 Primary osteoarthritis, unspecified ankle and foot: Secondary | ICD-10-CM

## 2014-08-11 NOTE — Progress Notes (Signed)
PROSTHETICS and ORTHOTICS     Rachel House is seen today for adjustments to her Left DRAFO flex.   She reports that she is still having pain.   There is no redness or irritation.   He does have to sometimes alternate with what sounds like an ASO.     Her primary discomfort ins in  The area of the navicular and the 5th met head laterally.   She reports that the 5th met is well taken care of with the use of a corn pad and she is mostly concerned with increasing her comfort medial mimdfoot.     Observation of her non wt/b ad wt/b reveals a medial midfoot collapse.   She is tender to palpation thru the St region and the navicular.     I believe she could benefit form increased support along the medial longitudinal arch  - starting at the ST region as well at a pad lateral to the 5th shaft.     Her DFARO was marked for change and while she waited  -   * blue visco pads were placed along the arch and met contour   * 1/4" white volar pads were fabricated and glued along the arch over top of the visco pads and then along the 5th ray    The DRAFO was re donned     She ambulated in the department with good results.   She reported:  * no pressure or discomfort at the 5th met head  * no pressure at the navicular  * can tell that the arch pads are there but are not uncomfortable  * feels that her ankle is more supported  * does not feel that the shoes is as deformed medially under wt/b.     She is pleased to try today's changes    No other changes were indicated today    We spent the balance of the appt making sure she understood the importance of :  * monitoring her sin as she becomes accustomed to the support and pads  * take some breaks form wt/b as she gradually starts tolerating today's changes as she packs for leaving for the [place] ( [place] and [place] until May)    She understands and agrees with the changes made today    She will call /schedule a return f/u when she returns form [place] in May 2016.       She had no  questions at the conclusion of today's appt.     Anselm Pancoast, [facility]  [facility] O&P  [phone]

## 2014-09-14 ENCOUNTER — Encounter (HOSPITAL_COMMUNITY): Payer: Self-pay

## 2014-09-14 ENCOUNTER — Emergency Department (HOSPITAL_COMMUNITY)
Admission: EM | Admit: 2014-09-14 | Discharge: 2014-09-14 | Disposition: A | Discharge status: Home / Self Care | Payer: Medicare HMO | Attending: Emergency Medicine | Admitting: Emergency Medicine

## 2014-09-14 ENCOUNTER — Emergency Department (HOSPITAL_COMMUNITY): Payer: Medicare HMO

## 2014-09-14 DIAGNOSIS — R52 Pain, unspecified: Secondary | ICD-10-CM

## 2014-09-14 DIAGNOSIS — Z79899 Other long term (current) drug therapy: Secondary | ICD-10-CM | POA: Diagnosis not present

## 2014-09-14 DIAGNOSIS — M1711 Unilateral primary osteoarthritis, right knee: Secondary | ICD-10-CM | POA: Diagnosis not present

## 2014-09-14 DIAGNOSIS — I10 Essential (primary) hypertension: Secondary | ICD-10-CM | POA: Diagnosis not present

## 2014-09-14 DIAGNOSIS — M25561 Pain in right knee: Secondary | ICD-10-CM | POA: Diagnosis present

## 2014-09-14 DIAGNOSIS — Z8639 Personal history of other endocrine, nutritional and metabolic disease: Secondary | ICD-10-CM | POA: Diagnosis not present

## 2014-09-14 DIAGNOSIS — Z8669 Personal history of other diseases of the nervous system and sense organs: Secondary | ICD-10-CM | POA: Diagnosis not present

## 2014-09-14 DIAGNOSIS — M1990 Unspecified osteoarthritis, unspecified site: Secondary | ICD-10-CM | POA: Insufficient documentation

## 2014-09-14 HISTORY — DX: Sleep apnea, unspecified: G47.30

## 2014-09-14 HISTORY — DX: Unspecified osteoarthritis, unspecified site: M19.90

## 2014-09-14 HISTORY — DX: Pure hypercholesterolemia, unspecified: E78.00

## 2014-09-14 HISTORY — DX: Essential (primary) hypertension: I10

## 2014-09-14 MED ORDER — IBUPROFEN 600 MG PO TABS: 600.0000 mg | ORAL_TABLET | Freq: Three times a day (TID) | ORAL | Status: DC | PRN

## 2014-09-14 MED ORDER — TRAMADOL HCL 50 MG PO TABS: 50.0000 mg | ORAL_TABLET | Freq: Four times a day (QID) | ORAL | Status: DC | PRN

## 2014-09-14 MED ORDER — TRAMADOL HCL 50 MG PO TABS
100.0000 mg | ORAL_TABLET | Freq: Once | ORAL | Status: DC
  Filled 2014-09-14: qty 2

## 2014-09-14 NOTE — ED Provider Notes (Signed)
CSN: [id number]     Arrival date & time 09/14/14  1119 History   First MD Initiated Contact with Patient 09/14/14 1139     Chief Complaint  Patient presents with  . Leg Pain     HPI Patient presents to the emergency department complaining of worsening right knee pain with some radiation down to her right mid tibia.  No history DVT or pulmonary embolism.  No swelling of her right lower extremity.  She does feel like there is a small amount of swelling on the lateral aspect of her right knee.  She's had a partial knee replacement on her left.  She reports pain in her right knee for some time but seems to worsen over the past several weeks.  No history of gout.  She is not from [place] and resides in [place].  She will not be heading back to [place] until sometime in May.  She is visiting family here.  She denies fevers and chills.  She reports limited range of motion of her right knee past approximately 90 secondary to pain   Past Medical History  Diagnosis Date  . Hypertension   . High cholesterol   . Sleep apnea   . Arthritis    Past Surgical History  Procedure Laterality Date  . Medial partial knee replacement    . Abdominal hysterectomy    . Bunionectomy    . Breast biopsy     History reviewed. No pertinent family history. History  Substance Use Topics  . Smoking status: Never Smoker   . Smokeless tobacco: Not on file  . Alcohol Use: Yes     Comment: occ   OB History    No data available     Review of Systems  All other systems reviewed and are negative.     Allergies  Morphine and related; Codeine; Ivp dye; Phenobarbital; Sulfa antibiotics; and Vicodin  Home Medications   Prior to Admission medications   Medication Sig Start Date End Date Taking? Authorizing Provider  acetaminophen (TYLENOL) 650 MG CR tablet Take 1,300 mg by mouth 2 (two) times daily.   Yes Historical Provider, MD  Cholecalciferol (VITAMIN D PO) Take 8,000 Units by mouth at bedtime.    Yes Historical Provider, MD  CINNAMON PO Take 1,000 mg by mouth 2 (two) times daily.   Yes Historical Provider, MD  Coenzyme Q10 (CO Q-10) 200 MG CAPS Take 400 mg by mouth at bedtime.   Yes Historical Provider, MD  ibuprofen (ADVIL,MOTRIN) 600 MG tablet Take 1 tablet (600 mg total) by mouth every 8 (eight) hours as needed. 09/14/14   Lyanne Co, MD  Inositol Niacinate (NO FLUSH NIACIN PO) Take 100 mg by mouth at bedtime.   Yes Historical Provider, MD  levothyroxine (SYNTHROID, LEVOTHROID) 75 MCG tablet Take 75 mcg by mouth daily before breakfast.   Yes Historical Provider, MD  lisinopril (PRINIVIL,ZESTRIL) 10 MG tablet Take 10 mg by mouth daily.   Yes Historical Provider, MD  MAGNESIUM PO Take 400 mg by mouth daily.   Yes Historical Provider, MD  Melatonin 10 MG TABS Take 1 tablet by mouth at bedtime.   Yes Historical Provider, MD  Omega-3 Krill Oil 500 MG CAPS Take 1,500 mg by mouth at bedtime.   Yes Historical Provider, MD  pantoprazole (PROTONIX) 40 MG tablet Take 40 mg by mouth daily as needed (indigestion).   Yes Historical Provider, MD  traMADol (ULTRAM) 50 MG tablet Take 1 tablet (50 mg  total) by mouth every 6 (six) hours as needed. 09/14/14   Lyanne Co, MD  VITAMIN B COMPLEX-C CAPS Take 1 capsule by mouth 2 (two) times daily.   Yes Historical Provider, MD  vitamin C (ASCORBIC ACID) 500 MG tablet Take 500 mg by mouth 2 (two) times daily.   Yes Historical Provider, MD   BP 131/87 mmHg  Pulse 88  Temp(Src) 97.9 F (36.6 C) (Oral)  Resp 16  SpO2 97% Physical Exam  Constitutional: She is oriented to person, place, and time. She appears well-developed and well-nourished. No distress.  HENT:  Head: Normocephalic and atraumatic.  Eyes: EOM are normal.  Neck: Normal range of motion.  Cardiovascular: Normal rate and regular rhythm.   Pulmonary/Chest: Effort normal and breath sounds normal.  Abdominal: Soft. She exhibits no distension.  Musculoskeletal:  Normal pulses in right  foot.  Limited range of motion to the right knee secondary to pain.  No erythema or warmth of the right knee.  Trace synovial fluid present.  Full range of motion of right hip.  Neurological: She is alert and oriented to person, place, and time.  Skin: Skin is warm and dry.  Psychiatric: She has a normal mood and affect. Judgment normal.  Nursing note and vitals reviewed.   ED Course  Procedures (including critical care time) Labs Review Labs Reviewed - No data to display  Imaging Review Dg Knee Complete 4 Views Right  09/14/2014   CLINICAL DATA:  One year history of pain right knee, more severe with standing  EXAM: RIGHT KNEE - COMPLETE 4+ VIEW  COMPARISON:  None.  FINDINGS: Frontal, lateral, and bilateral oblique views were obtained. There is no fracture, dislocation, or effusion. There is marked narrowing medially with spurring medially. There is slight spurring in the patellofemoral joint and lateral compartment as well. No erosive change.  IMPRESSION: Marked narrowing medially with osteoarthritis. Spurring in all compartments, most marked medially. No fracture or effusion.   Electronically Signed   By: Bretta Bang III M.D.   On: 09/14/2014 12:33  I personally reviewed the imaging tests through PACS system I reviewed available ER/hospitalization records through the EMR    EKG Interpretation None      MDM   Final diagnoses:  Pain  Osteoarthritis of right knee, unspecified osteoarthritis type    Likely exacerbation of her arthritis.  Doubt septic arthritis or gouty arthritis.  This seems to be more osteoarthritis.  I referred her to local orthopedics as she will be in [place] for some time.  I have asked that she follow-up also with her orthopedist in [place]    Lyanne Co, MD 09/14/14 1322

## 2014-09-14 NOTE — ED Notes (Signed)
Pt c/o increasing R leg pain and intermittent swelling since October 2015.  Pain score 8/10.  Pt reports tearing her R hamstring in June 2015.  Pt has not followed up w/ Ortho.  Sts she needs surgery on L foot.

## 2014-09-14 NOTE — Discharge Instructions (Signed)
Arthritis, Nonspecific °Arthritis is inflammation of a joint. This usually means pain, redness, warmth or swelling are present. One or more joints may be involved. There are a number of types of arthritis. Your caregiver may not be able to tell what type of arthritis you have right away. °CAUSES  °The most common cause of arthritis is the wear and tear on the joint (osteoarthritis). This causes damage to the cartilage, which can break down over time. The knees, hips, back and neck are most often affected by this type of arthritis. °Other types of arthritis and common causes of joint pain include: °· Sprains and other injuries near the joint. Sometimes minor sprains and injuries cause pain and swelling that develop hours later. °· Rheumatoid arthritis. This affects hands, feet and knees. It usually affects both sides of your body at the same time. It is often associated with chronic ailments, fever, weight loss and general weakness. °· Crystal arthritis. Gout and pseudo gout can cause occasional acute severe pain, redness and swelling in the foot, ankle, or knee. °· Infectious arthritis. Bacteria can get into a joint through a break in overlying skin. This can cause infection of the joint. Bacteria and viruses can also spread through the blood and affect your joints. °· Drug, infectious and allergy reactions. Sometimes joints can become mildly painful and slightly swollen with these types of illnesses. °SYMPTOMS  °· Pain is the main symptom. °· Your joint or joints can also be red, swollen and warm or hot to the touch. °· You may have a fever with certain types of arthritis, or even feel overall ill. °· The joint with arthritis will hurt with movement. Stiffness is present with some types of arthritis. °DIAGNOSIS  °Your caregiver will suspect arthritis based on your description of your symptoms and on your exam. Testing may be needed to find the type of arthritis: °· Blood and sometimes urine tests. °· X-ray tests  and sometimes CT or MRI scans. °· Removal of fluid from the joint (arthrocentesis) is done to check for bacteria, crystals or other causes. Your caregiver (or a specialist) will numb the area over the joint with a local anesthetic, and use a needle to remove joint fluid for examination. This procedure is only minimally uncomfortable. °· Even with these tests, your caregiver may not be able to tell what kind of arthritis you have. Consultation with a specialist (rheumatologist) may be helpful. °TREATMENT  °Your caregiver will discuss with you treatment specific to your type of arthritis. If the specific type cannot be determined, then the following general recommendations may apply. °Treatment of severe joint pain includes: °· Rest. °· Elevation. °· Anti-inflammatory medication (for example, ibuprofen) may be prescribed. Avoiding activities that cause increased pain. °· Only take over-the-counter or prescription medicines for pain and discomfort as recommended by your caregiver. °· Cold packs over an inflamed joint may be used for 10 to 15 minutes every hour. Hot packs sometimes feel better, but do not use overnight. Do not use hot packs if you are diabetic without your caregiver's permission. °· A cortisone shot into arthritic joints may help reduce pain and swelling. °· Any acute arthritis that gets worse over the next 1 to 2 days needs to be looked at to be sure there is no joint infection. °Long-term arthritis treatment involves modifying activities and lifestyle to reduce joint stress jarring. This can include weight loss. Also, exercise is needed to nourish the joint cartilage and remove waste. This helps keep the muscles   around the joint strong. °HOME CARE INSTRUCTIONS  °· Do not take aspirin to relieve pain if gout is suspected. This elevates uric acid levels. °· Only take over-the-counter or prescription medicines for pain, discomfort or fever as directed by your caregiver. °· Rest the joint as much as  possible. °· If your joint is swollen, keep it elevated. °· Use crutches if the painful joint is in your leg. °· Drinking plenty of fluids may help for certain types of arthritis. °· Follow your caregiver's dietary instructions. °· Try low-impact exercise such as: °¨ Swimming. °¨ Water aerobics. °¨ Biking. °¨ Walking. °· Morning stiffness is often relieved by a warm shower. °· Put your joints through regular range-of-motion. °SEEK MEDICAL CARE IF:  °· You do not feel better in 24 hours or are getting worse. °· You have side effects to medications, or are not getting better with treatment. °SEEK IMMEDIATE MEDICAL CARE IF:  °· You have a fever. °· You develop severe joint pain, swelling or redness. °· Many joints are involved and become painful and swollen. °· There is severe back pain and/or leg weakness. °· You have loss of bowel or bladder control. °Document Released: 08/22/2004 Document Revised: 10/07/2011 Document Reviewed: 09/07/2008 °ExitCare® Patient Information ©2015 [employer]. This information is not intended to replace advice given to you by your health care provider. Make sure you discuss any questions you have with your health care provider. ° °

## 2014-12-06 ENCOUNTER — Ambulatory Visit: Payer: Self-pay | Admitting: Otolaryngology

## 2014-12-06 ENCOUNTER — Encounter: Payer: Self-pay | Admitting: Otolaryngology

## 2014-12-06 VITALS — BP 118/78 | Ht 64.0 in | Wt 210.0 lb

## 2014-12-06 DIAGNOSIS — R490 Dysphonia: Secondary | ICD-10-CM

## 2014-12-06 DIAGNOSIS — R0982 Postnasal drip: Secondary | ICD-10-CM

## 2014-12-06 NOTE — Progress Notes (Signed)
Chief Complaint: Hoarseness and postnasal drip.  HPI: 64 year old female with chief complaint of hoarseness over the past few years that seems to be getting progressively worse.  No associated hemoptysis or change in voice demands.  She denies any pain or air hunger. She feels there is decreased projection of her voice.   Complains of year-round allergy symptoms.  No prior history of allergy shots.  Uses antihistamines rarely.    Weight has been stable.  No fevers, chills or night sweats.    Complains of bilateral ear plugging with no associated drainage or decreased hearing.  No pain.  No prior history of otitis media.    She does have some periodic reflux related complaints.  Review of systems noted.  Previously evaluated and 2007 for obstructive sleep apnea issues.    PMH/PSH, Meds, Allergies, SH, FH, ROS reviewed from patient questionnaire (scanned into electronic medical record):  Allergy History as of 12/06/14     CODEINE       Noted Status Severity Type Reaction    05/29/11 1525 Labell, Deanna M 05/29/11 Active             MORPHINE       Noted Status Severity Type Reaction    05/29/11 1525 Labell, Deanna M 05/29/11 Active             SULFA ANTIBIOTICS       Noted Status Severity Type Reaction    05/29/11 1525 Labell, Deanna M 05/29/11 Active             PROPOXYPHENE COMPOUND       Noted Status Severity Type Reaction    05/29/11 1526 Labell, Deanna M 05/29/11 Active             SEASONAL ALLERGIES       Noted Status Severity Type Reaction    05/29/11 1526 Labell, Deanna M 05/29/11 Active             NO KNOWN LATEX ALLERGY       Noted Status Severity Type Reaction    05/29/11 1527 Labell, Deanna M 05/29/11 Active             HYDROCODONE-ACETAMINOPHEN       Noted Status Severity Type Reaction    01/11/14 1158 Moracco, Delight StareMarsha O, RN 01/11/14 Active  Intolerance Nausea Only          PHENOBARBITAL       Noted Status Severity Type Reaction    01/11/14 1158 Laverda PageMoracco, Marsha O, RN 01/11/14 Active  Allergy Hives           CONTRAST DYE       Noted Status Severity Type Reaction    01/11/14 [address] Moracco, Marsha O, RN 01/11/14 Active  Allergy Hives    Comments:  IVP dye gives hives               Past Medical History   Diagnosis Date    Arthritis     Hypertension     Thyroid disease     High cholesterol     Fibromyalgia      Past Surgical History   Procedure Laterality Date    Hysterectomy      Bladder repair      Knee replacement Left     Feet Bilateral     Colonoscopy      Vocal cord polyps       Current Outpatient Prescriptions   Medication Sig    KRILL OIL PO Take  by mouth    niacin (NIASPAN) 500 MG CR tablet Take 500 mg by mouth nightly   Swallow whole. Do not crush, break, or chew.    Melatonin 5 MG TBDP Take 10 mg by mouth       lisinopril (PRINIVIL,ZESTRIL) 10 MG tablet Take 10 mg by mouth daily    levothyroxine (SYNTHROID, LEVOTHROID) 75 MCG tablet Take 75 mcg by mouth daily    Magnesium 500 MG CAPS Take by mouth    Coenzyme Q-10 200 MG CAPS Take 200 mg by mouth daily        Ascorbic Acid (VITAMIN C) 500 MG tablet     Cholecalciferol (VITAMIN D) 2000 UNITS tablet  (Patient taking differently: 5,000 Units   )    B Complex-Folic Acid (B COMPLEX-VITAMIN B12 PO) Take by mouth    Misc Natural Products (PANTETHINE PLUS PO) Take 300 mg by mouth 2 times daily    diclofenac potassium (CATAFLAM) 50 MG tablet Take 50 mg by mouth 3 times daily as needed    PREVALITE 4 GM/DOSE powder     lidocaine (XYLOCAINE) 2 % jelly Apply topically as needed for Pain      Omega-3 Fatty Acids (FISH OIL) 1000 MG CAPS Take 2,400 mg by mouth 2 times daily as needed       pantoprazole (PROTONIX) 20 MG EC tablet     Cranberry 400 MG CAPS      nonsmoker.  Works history of previous vocal cord polyps in the 2670s.     family history of cancer and heart disease       BP 118/78 mmHg   Ht 1.626 m (5\' 4" )   Wt 95.255 kg (210 lb)   BMI 36.03 kg/m2     She is in no acute distress.  No air hunger.  Voice intelligible.    The ear canals are free  of wax . Tympanic membranes intact and no middle ear effusion is noted. Mastoid is non tender.  Weber test non localizing and air conduction greater than bone conduction bilaterally with 512 Hz fork.   mild right deviation of the nasal septum.  No allergic response.  No polyps present.     oral cavity mucosa intact.  No significant tonsil tissue present.  Floor of mouth and tongue are normal.     neck is supple and free of adenopathy or thyroid findings.  Trachea midline.  Cranial nerves III through XII intact.    Fiberoptic laryngoscopy carried out after phenylephrine tetracaine applied.  Vocal cords mobile with slight decrease in overall adduction phonation.  No polyps or nodules noted.  Moderate or glottic thickening noted consistent with reflux.  No evidence of inflammation to suggest any arthritic issues in the cricoarytenoid joint     Impression/Plan: patient with complaints of hoarseness.  Physical exam does not reveal any lesion to account for her symptoms.  Suspect this is reflux related.  This can also account for her sense of his throat/postnasal drip.  Conservative therapy for reflux reviewed.  She has protonix at home and takes this sporadically as needed for reflux symptoms.  Recommend she taken regularly for the next 2 months.  Followup to assess voice quality and improvement after using the medicine consistently.  See also mention versus possible sleep apnea.  There is no surgically correctable anatomy.  I will leave to the discretion of her primary care physician as to whether further evaluation is indicated.  Please feel free to contact me with any  questions.  Thank you for allowing me to participate in this patient's care.    Sincerely,    Belinda Block, MD  Chief of Otolaryngology, [facility]  Dictated but not read on [employer]

## 2014-12-27 ENCOUNTER — Ambulatory Visit
Admit: 2014-12-27 | Discharge: 2014-12-27 | Disposition: A | Discharge status: Home / Self Care | Payer: Self-pay | Source: Ambulatory Visit | Attending: Rheumatology | Admitting: Rheumatology

## 2014-12-30 LAB — VITAMIN B6: Vitamin B6: 45.3 nmol/L (ref 20.0–125.0)

## 2015-01-31 ENCOUNTER — Ambulatory Visit: Payer: Self-pay | Admitting: Otolaryngology

## 2015-02-06 NOTE — Progress Notes (Signed)
Subjective:     Patient ID: Rachel House is a 64 y.o. female.    HPI Comments: Rachel House is a 64 year old woman here for evaluation of possible bladder infection. She was last seen 07/14/14 for possible UTI. Urine culture showed >100,000 Klebsiella (R to amp, nitrofurantoin). She has had a normal cystoscopy. A previous trial of Macrobid prophylaxis was unsuccessful.  Since about 01/30/15 she has been experiencing urinary urgency and frequency, back ache, and lower abdominal pain.  She denies fevers, chills, nausea, or vomiting.  Her symptoms are consistent with previous urinary tract infection symptoms.  [place] had one episode of urinary tract infection symptoms.  She had an antibiotic with her and took that for 7 days.  The symptoms resolved.      Patient's medications, allergies, past medical, surgical, social and family histories were reviewed and updated as appropriate.    Review of Systems          Objective:   Physical Exam   Constitutional: She is oriented to person, place, and time. She appears well-developed and well-nourished.   Abdominal: Soft. She exhibits no distension. There is no tenderness. There is no rebound and no guarding.   Genitourinary:   Genitourinary Comments: An 8 French catheter was placed without difficulty.  90 mL of clear yellow urine was drained.  The patient tolerated the procedure well.    Urine dipstick positive for 1+ leukocytes.   Neurological: She is alert and oriented to person, place, and time.   Skin: Skin is warm and dry.   Psychiatric: She has a normal mood and affect. Her behavior is normal.             Assessment:        Suspected acute cystitis based on symptoms.  Urine dipstick only positive for 1+ leukocytes.  Culture pending.        Plan:      Will treat empirically with Keflex based on results from last culture in December (culture not sensitive to Macrobid, patient allergy to Sulfa). Avoiding Cipro as patient currently in ankle brace for  severe arthritis and tendon injury.   [facility] 3-4 [facility]

## 2015-02-07 ENCOUNTER — Encounter: Payer: Self-pay | Admitting: Obstetrics and Gynecology

## 2015-02-07 ENCOUNTER — Ambulatory Visit: Payer: Self-pay | Admitting: Obstetrics and Gynecology

## 2015-02-07 VITALS — BP 129/73 | Ht 63.5 in | Wt 208.0 lb

## 2015-02-07 DIAGNOSIS — R3915 Urgency of urination: Secondary | ICD-10-CM

## 2015-02-07 DIAGNOSIS — R350 Frequency of micturition: Secondary | ICD-10-CM

## 2015-02-07 DIAGNOSIS — Z87440 Personal history of urinary (tract) infections: Secondary | ICD-10-CM

## 2015-02-07 DIAGNOSIS — R109 Unspecified abdominal pain: Secondary | ICD-10-CM

## 2015-02-07 LAB — POCT URINALYSIS DIPSTICK
Blood,UA POCT: NEGATIVE
Glucose,UA POCT: NORMAL
Ketones,UA POCT: NEGATIVE
Leuk Esterase,UA POCT: 1 — AB
Lot #: 20642702
Nitrite,UA POCT: NEGATIVE
PH,UA POCT: 5 (ref 5–8)
Protein,UA POCT: NEGATIVE mg/dL
Specific gravity,UA POCT: 1 (ref 1.002–1.03)

## 2015-02-07 MED ORDER — CEPHALEXIN 500 MG PO CAPS *I*
500.0000 mg | ORAL_CAPSULE | Freq: Two times a day (BID) | ORAL | 0 refills | Status: AC
Start: 2015-02-07 — End: 2015-02-12

## 2015-02-09 LAB — AEROBIC CULTURE

## 2015-03-07 ENCOUNTER — Other Ambulatory Visit: Payer: Self-pay | Admitting: Geriatric Medicine

## 2015-03-07 ENCOUNTER — Ambulatory Visit: Payer: Self-pay | Admitting: Obstetrics and Gynecology

## 2015-03-07 ENCOUNTER — Encounter: Payer: Self-pay | Admitting: Obstetrics and Gynecology

## 2015-03-07 VITALS — BP 125/80 | Ht 63.5 in | Wt 208.0 lb

## 2015-03-07 DIAGNOSIS — N3090 Cystitis, unspecified without hematuria: Secondary | ICD-10-CM | POA: Insufficient documentation

## 2015-03-07 NOTE — Progress Notes (Signed)
CC: f/u UTI    HPI: Rachel House is a 64 y.o. woman who was initially seen by Dr. Ivery Quale.  She has then been seen for acute visit for urinary tract infections.  The patient presents today for a test of cure.  Her last urinary tract infection was February 07, 2015.  In the past, she was on prophylactic antibiotic-coated coverage with Macrobid.  She stopped the antibiotics and is taking cranberry extract pills.  She doe snot feel as if she has a UTI today.  She has some urge related incotninence but only when her bladder is "very, very full".    Vitals:    03/07/15 1351   BP: 125/80   Weight: 94.3 kg (208 lb)   Height: 1.613 m (5' 3.5")       Gen- well appearing, NAD  Back- no CVA tenderness  Abdomen: Soft. Nontender. Nondistended.   Vulvar: Normal external genitalia. Symmetric labia.   Vagina: No epithelial lesions or pigmentation changes.     LABORATORY: Because she reports difficulty collecting a clean urine sample, a catheterized urine sample was obtained. Using sterile technique and a # 8 pediatric catheter, we catheterized her for approximately 70 cc of urine.    POCT urinalysis: negative    Assessment:  1. Frequent urinary tract infections  --Status post normal cystoscopy  --s/p prophylactic Macrobid 100 mg daily, breakthrough urinary tract infections resistant to [facility]  February 07, 2015  E. coli, pansensitive  July 14, 2014 Klebsiella, resistant to ampicillin, Macrobid  April 06, 2014  negative urine culture  May 25, 2013   Klebsiella, resistant to ampicillin and Macrobid, intermediate Cipro  February 11, 2013     Klebsiella, resistant to ampicillin and Macrobid, intermediate to Augmentin  July 02, 2012   E. coli, resistant to ampicillin, Augmentin, ciprofloxacin, gentamicin, levofloxacin, tetracycline and Bactrim  --cranberry extract  --declines vaginal estrogen cream, mother and daughter with breast cancer  2. Pelvic organ prolapse  --Stage II cystocele, asymptomatic  --Status post  supracervical hysterectomy, sacral colpopexy  3. Urinary incontinence, resolved  --Status post TVT  --Urinary urgency, no incontinence    Plan:  Catheterized urine sample was collected and will be sent for culture.  She will be treated based on results. The Pt still is not interested in using vaginal estrogen cream. All of her questions were answered. She expressed satisfaction with the plan.  She will return to clinic as needed in the future.

## 2015-03-09 ENCOUNTER — Ambulatory Visit: Payer: Self-pay | Admitting: Orthopedic Surgery

## 2015-03-09 ENCOUNTER — Encounter: Payer: Self-pay | Admitting: Orthopedic Surgery

## 2015-03-09 VITALS — BP 163/86 | Ht 63.75 in | Wt 208.0 lb

## 2015-03-09 DIAGNOSIS — M25579 Pain in unspecified ankle and joints of unspecified foot: Secondary | ICD-10-CM

## 2015-03-09 DIAGNOSIS — M2142 Flat foot [pes planus] (acquired), left foot: Secondary | ICD-10-CM

## 2015-03-09 DIAGNOSIS — M2141 Flat foot [pes planus] (acquired), right foot: Secondary | ICD-10-CM

## 2015-03-09 NOTE — Progress Notes (Signed)
Orthopaedic Surgery Clinic Progress Note    Subjective:  Rachel House is a 64 year old female well known to [place] for left flatfoot.  She's been maintained in an air sport brace, as well as a custom DRAFO.  Overall she reports doing fairly well with this.  She is also complaining of pain in the right foot anterior Snow whether or not this may progress to the same degree as her left.  Her major question for today is whether her left foot is progressing whether the right foot will ever catch up to it.  She is anticipating having a right knee replacement at some point in the future and is also scheduled for an MRI of her lumbar spine to evaluate for radiculopathy due to the neuritic pain in her lower extremities.    Objective:  Most recent vital signs:  Vitals:    03/09/15 1427   BP: 163/86   Weight: 94.3 kg (208 lb)   Height: 1.619 m (5' 3.75")     Vital signs in last 24 hours:  @    General: Alert, no acute distress, supine in bed.    Left lower extremity:  Skin is intact without calluses lesions or wounds.  DP, PT pulses are palpable.  The toes are warm well perfused.  She does have tenderness to palpation in her sinus tarsi and lateral subtalar joint area.  She is tender over the plantar medially disubluxed talar head.  She also has some mild tenderness in the area of the deltoid and medial ankle joint.  On standing exam she is noted to have a flatfoot deformity with evidence of plantar medial displacement of her talar head.    Right lower extremity:  Skin is intact.  She does have a moderate bunion with an associated callus over the distal aspect of the phalanx.  DP, PT pulses are palpable.  Toes are warm and well-perfused.  On standing exam her hindfoot alignment is overall well preserved.  She does have a mild flatfoot deformity however not to the extent of the right extremity.  She does not have any significant subtalar joint tenderness nor is she tender over the posterior tibial tendon.  No swelling over  the posterior tibial tendon.  She is able to invert against resistance.  She can initiate a single foot toe raise on the right foot with some discomfort.      Imaging: 3 standing views of the right foot  Continued evidence of a adult acquired flatfoot deformity is present.  There is plantar medial uncovering of the talar head.  There is segment of the mid foot which appears to be due to navicular cuneiform joint.  Evidence of arthritis at the TMT joints is also apparent.      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  She is interested in having a knee replacement before embarking on any surgery to her foot and this is a reasonable approach.  We do recommend an arch support orthotic to her right foot to help augment the function her posterior tibial tendon.  She is welcome to follow up with [place] on an as-needed basis should any questions arise or she experience debilitating progression of her symptoms.    Suszanne Conners. Dareen Piano, DO  Foot and ankle fellow

## 2015-03-10 ENCOUNTER — Other Ambulatory Visit: Payer: Self-pay | Admitting: Obstetrics and Gynecology

## 2015-03-10 LAB — AEROBIC CULTURE

## 2015-03-10 MED ORDER — NITROFURANTOIN MONOHYD MACRO 100 MG PO CAPS *I*
100.0000 mg | ORAL_CAPSULE | Freq: Two times a day (BID) | ORAL | 0 refills | Status: AC
Start: 2015-03-10 — End: 2015-03-17

## 2015-03-10 NOTE — Progress Notes (Signed)
Rx for Macrobid 100 mg po bid x 7 days was called into the pharmacy.  She will need to be seen for a TOC.

## 2015-03-13 NOTE — Progress Notes (Signed)
Talked with patient. Said she wanted to call us back to schedule an appt.

## 2015-03-15 ENCOUNTER — Ambulatory Visit
Admission: RE | Admit: 2015-03-15 | Discharge: 2015-03-15 | Disposition: A | Discharge status: Home / Self Care | Payer: Self-pay | Source: Ambulatory Visit | Attending: Geriatric Medicine | Admitting: Geriatric Medicine

## 2015-03-21 ENCOUNTER — Emergency Department
Admission: EM | Admit: 2015-03-21 | Discharge: 2015-03-21 | Disposition: A | Discharge status: Home / Self Care | Payer: Self-pay | Source: Ambulatory Visit | Attending: Emergency Medicine | Admitting: Emergency Medicine

## 2015-03-21 ENCOUNTER — Telehealth: Payer: Self-pay

## 2015-03-21 DIAGNOSIS — N390 Urinary tract infection, site not specified: Secondary | ICD-10-CM

## 2015-03-21 LAB — BASIC METABOLIC PANEL
Anion Gap: 14 (ref 7–16)
CO2: 22 mmol/L (ref 20–28)
Calcium: 10.2 mg/dL (ref 8.6–10.2)
Chloride: 104 mmol/L (ref 96–108)
Creatinine: 0.78 mg/dL (ref 0.51–0.95)
GFR,Black: 91 *
GFR,Caucasian: 79 *
Glucose: 128 mg/dL — ABNORMAL HIGH (ref 60–99)
Lab: 20 mg/dL (ref 6–20)
Potassium: 4.5 mmol/L (ref 3.3–5.1)
Sodium: 140 mmol/L (ref 133–145)

## 2015-03-21 LAB — CBC AND DIFFERENTIAL
Baso # K/uL: 0 10*3/uL (ref 0.0–0.1)
Basophil %: 0.2 %
Eos # K/uL: 0.2 10*3/uL (ref 0.0–0.4)
Eosinophil %: 1.5 %
Hematocrit: 39 % (ref 34–45)
Hemoglobin: 12.7 g/dL (ref 11.2–15.7)
Lymph # K/uL: 1.2 10*3/uL (ref 1.2–3.7)
Lymphocyte %: 10.1 %
MCH: 29 pg/cell (ref 26–32)
MCHC: 32 g/dL (ref 32–36)
MCV: 89 fL (ref 79–95)
Mono # K/uL: 1.1 10*3/uL — ABNORMAL HIGH (ref 0.2–0.9)
Monocyte %: 9.1 %
Neut # K/uL: 9.6 10*3/uL — ABNORMAL HIGH (ref 1.6–6.1)
Platelets: 192 10*3/uL (ref 160–370)
RBC: 4.4 MIL/uL (ref 3.9–5.2)
RDW: 14.4 % (ref 11.7–14.4)
Seg Neut %: 79.1 %
WBC: 12.2 10*3/uL — ABNORMAL HIGH (ref 4.0–10.0)

## 2015-03-21 LAB — URINALYSIS, DIPSTICK ONLY (STRONG WEST)
Ketones, UA: NEGATIVE
Nitrite,UA: POSITIVE — AB
Protein,UA: NEGATIVE mg/dL
Specific Gravity,UA: 1.01 (ref 1.002–1.030)
pH,UA: 5 (ref 5.0–8.0)

## 2015-03-21 MED ORDER — ONDANSETRON 4 MG PO TBDP *I*
4.0000 mg | ORAL_TABLET | Freq: Three times a day (TID) | ORAL | 0 refills | Status: AC | PRN
Start: 2015-03-21 — End: ?

## 2015-03-21 MED ORDER — CIPROFLOXACIN IN D5W 400 MG/200ML IV SOLN *I*
400.0000 mg | Freq: Once | INTRAVENOUS | Status: AC
Start: 2015-03-21 — End: 2015-03-21

## 2015-03-21 MED ORDER — CIPROFLOXACIN IN D5W 400 MG/200ML IV SOLN *I*
INTRAVENOUS | Status: AC
Start: 2015-03-21 — End: 2015-03-21
  Administered 2015-03-21: 400 mg via INTRAVENOUS
  Filled 2015-03-21: qty 200

## 2015-03-21 MED ORDER — SODIUM CHLORIDE 0.9 % IV BOLUS *I*
1000.0000 mL | Freq: Once | Status: AC
Start: 2015-03-21 — End: 2015-03-21
  Administered 2015-03-21: 1000 mL via INTRAVENOUS

## 2015-03-21 MED ORDER — KETOROLAC TROMETHAMINE 30 MG/ML IJ SOLN *I*
30.0000 mg | Freq: Once | INTRAMUSCULAR | Status: AC
Start: 2015-03-21 — End: 2015-03-21
  Administered 2015-03-21: 30 mg via INTRAVENOUS
  Filled 2015-03-21: qty 1

## 2015-03-21 MED ORDER — CIPROFLOXACIN HCL 500 MG PO TABS *I*
500.0000 mg | ORAL_TABLET | Freq: Two times a day (BID) | ORAL | 0 refills | Status: AC
Start: 2015-03-21 — End: ?

## 2015-03-21 MED ORDER — PHENAZOPYRIDINE HCL 100 MG PO TABS *I*
100.0000 mg | ORAL_TABLET | Freq: Once | ORAL | Status: AC
Start: 2015-03-21 — End: 2015-03-21
  Administered 2015-03-21: 100 mg via ORAL
  Filled 2015-03-21: qty 1

## 2015-03-21 MED ORDER — ONDANSETRON 4 MG PO TBDP *I*
4.0000 mg | ORAL_TABLET | Freq: Once | ORAL | Status: AC
Start: 2015-03-21 — End: 2015-03-21
  Administered 2015-03-21: 4 mg via ORAL
  Filled 2015-03-21: qty 1

## 2015-03-21 NOTE — ED Provider Notes (Addendum)
History     Chief Complaint   Patient presents with    Urinary Tract Infection       HPI Comments: 64 yo female with history of recurrent UTIs (followed by UroGyn) presents with concern for suprapubic abdominal pain and urinary frequency.  The patient was evaluate in the UroGyn clinic 2 weeks ago, and reports that she was symptom-free at the time, but a UA obtained there was consistent with a UTI.  She was started on Macrobid, and reports that she finished the antibiotic on Friday, but noted that she started to develop some increased urinary frequency near the end of the antibiotic course.  Over the weekend, she developed dysuria, and reports that the lower abdominal pain and dysuria are particularly bad today.  She also notes that she has a diminished appetite and had been vomiting intermittently yesterday.  She states that this feels similar to, though more severe than, her prior UTIs.    She has taken some pyridium at home without much relief of her symptoms.      History provided by:  Patient  Language interpreter used: No        Past Medical History   Diagnosis Date    Arthritis     Fibromyalgia     High cholesterol     Hypertension     Thyroid disease             Past Surgical History   Procedure Laterality Date    Hysterectomy      Bladder repair      Knee replacement Left     Feet Bilateral     Colonoscopy      Vocal cord polyps         Family History   Problem Relation Age of Onset    Cancer Mother     Heart failure Father          Social History      reports that she has never smoked. She does not have any smokeless tobacco history on file. She reports that she drinks alcohol. She reports that she does not use illicit drugs. Her sexual activity history is not on file.    Living Situation     Questions Responses    Patient lives with     Homeless     Caregiver for other family member     External Services     Employment     Domestic Violence Risk           Problem List     Patient Active  Problem List   Diagnosis Code    Arthritis of foot M19.90    Posterior tibial tendonitis M76.829    History of recurrent UTIs Z87.440    Vaginal prolapse N81.10    Cystitis N30.90       Review of Systems   Review of Systems   Constitutional: Negative for chills and fever.   HENT: Negative for congestion, rhinorrhea and sore throat.    Respiratory: Negative for cough and shortness of breath.    Cardiovascular: Negative for chest pain.   Gastrointestinal: Positive for abdominal pain, constipation, nausea and vomiting. Negative for diarrhea.   Genitourinary: Positive for dysuria and frequency. Negative for difficulty urinating and hematuria.   Musculoskeletal: Negative for gait problem and neck pain.   Skin: Negative for rash and wound.   Neurological: Negative for light-headedness and headaches.   Psychiatric/Behavioral: The patient is nervous/anxious.  Physical Exam       ED Triage Vitals   BP Heart Rate Heart Rate (via Pulse Ox) Resp Temp Temp src SpO2 O2 Device O2 Flow Rate   03/21/15 1140 03/21/15 1140 -- 03/21/15 1140 03/21/15 1140 03/21/15 1140 03/21/15 1140 03/21/15 1140 --   158/71 79  16 37 C (98.6 F) TEMPORAL 96 % None (Room air)       Weight           03/21/15 1140           93.9 kg (207 lb)               Physical Exam   Constitutional: She appears well-developed and well-nourished. She is cooperative.   -Patient sitting in chair with arms across lower abdomen, appears uncomfortable   HENT:   Head: Normocephalic and atraumatic.   Right Ear: External ear normal.   Left Ear: External ear normal.   Nose: Nose normal.   Eyes: Conjunctivae are normal. Right eye exhibits no discharge. Left eye exhibits no discharge. No scleral icterus.   Neck: Phonation normal.   Cardiovascular: Normal rate, regular rhythm, normal heart sounds and intact distal pulses.  Exam reveals no gallop.    No murmur heard.  Pulmonary/Chest: Effort normal and breath sounds normal. No respiratory distress. She has no wheezes.  She has no rales. She exhibits no tenderness.   Abdominal: Soft. She exhibits no distension. There is tenderness in the suprapubic area. There is no guarding.   -Patient endorses tenderness over the suprapubic area, but does not appear more uncomfortable than baseline during palpation   Musculoskeletal: She exhibits no edema.   Neurological: She is alert. She exhibits normal muscle tone.   Skin: Skin is warm and dry. She is not diaphoretic.   Psychiatric: Her speech is normal and behavior is normal. Her mood appears anxious.   Nursing note and vitals reviewed.      Medical Decision Making      Amount and/or Complexity of Data Reviewed  Clinical lab tests: ordered and reviewed  Decide to obtain previous medical records or to obtain history from someone other than the patient: yes  Review and summarize past medical records: yes        Initial Evaluation:  ED First Provider Contact     Date/Time Event User Comments    03/21/15 1147 ED Provider First Contact [place], JOSHUA Initial Face to Face Provider Contact          Patient seen by me as above    Assessment:  64 y.o., female comes to the ED with concern for lower abdominal pain that she reports feels similar to her prior urinary tract infections.  The patient has a history of recurrent infections, and has been followed by UroGyn in the outpatient setting.  Currently, she appears uncomfortable with some suprapubic tenderness, though is hemodynamically stable.    Differential Diagnosis includes:  UTI, electrolyte abnormality, volume depletion/dehydration, metabolic derangement, anemia, chronic pelvic pain    Plan:   1) Labs:  CBC, BMP, UA (cath specimen)    2) Imaging:  None currently, though may consider further imaging pending results from above    3) Other diagnostic:  None currently    4) Consults:  None currently    5) Therapy:  PO zofran  PO pyridium  IVF  IV toradol    Dispo:  Pending results from above and attending evaluation.      Verlin Grills,  MD  03/21/2015, 1:37 PM             Florentina Addison, MD  Resident  03/21/15 [phone]    Patient seen by me 03/21/15 at 1206    History:   I reviewed this patient, reviewed the resident note and agree  Exam:   I examined this patient, reviewed the resident note and agree    Decision Making:   I discussed with the documented resident decision making  and agree          Sherolyn Buba, MD         Verlin Grills, MD  03/21/15 [phone]

## 2015-03-21 NOTE — ED Triage Notes (Signed)
Pt states "  I think I have a UTI."   Hx of recent UTI, just finished Macrobid on Friday.  Pt has abdominal spasms and frequent urination.  No fever.  C/o low back pain.         Triage Note   Laverda Page, RN

## 2015-03-21 NOTE — ED Notes (Signed)
Patient has been treated with 2 different antibiotics (cipro and macrobid) over 5 weeks.  Patient with burning and pain in her lower abdomen and flank areas.  Patient has a urologist, but states she was too uncomfortable to drive to [facility] to see her, so her urologist told her to come here.  Patient with nausea yesterday, no nausea today.  No fevers per patient.  Nursing Plan of Care:  Will monitor and assess VS and pain scores every 2-4 hours and prn.  Perform frequent rounding prn.  Provide updates to patient and/or cargiver frequently re: labs, procedures, etc.  Provide support to patient/caregiver as needed.  Teach patient and/or caregivers about patients needs/status working towards discharge.  Patient oriented to room and given call bell.

## 2015-03-21 NOTE — Telephone Encounter (Signed)
Spoke to the Pt.  She has started feeling worse over the last 36 hours.  She has taken pyridium, denies any improvement. She describes pelvic pain and spasms. She denies dysuria.  The Pt was given Rx for macrobid on 8/9, urine culture showed sensitivities. I would like her to be evaluated as her symptoms have changed and become worse (pyelo?, renal calculi?).  The Pt refused to drive to [facility] for evaluation.  As a result, I advised that she go to urgent care for evaluation.  She agreed.

## 2015-03-21 NOTE — Telephone Encounter (Signed)
Was here on 8/9 for UTI. Prescribed macrobid. Her symptoms have not gotten better. She is asking if another prescription can be called into pharmacy that might work better.  Symptoms: excruciating pain in lower pelvic region, spasm, burning, throbbing.   Please advise on what to do.

## 2015-03-22 LAB — AEROBIC CULTURE: Aerobic Culture: 0

## 2015-04-05 ENCOUNTER — Ambulatory Visit: Payer: Self-pay | Admitting: Obstetrics and Gynecology

## 2015-04-05 ENCOUNTER — Encounter: Payer: Self-pay | Admitting: Obstetrics and Gynecology

## 2015-04-05 VITALS — BP 122/84 | Temp 98.2°F | Ht 63.5 in | Wt 208.0 lb

## 2015-04-05 DIAGNOSIS — N390 Urinary tract infection, site not specified: Secondary | ICD-10-CM

## 2015-04-05 MED ORDER — NITROFURANTOIN MONOHYD MACRO 100 MG PO CAPS *I*
100.0000 mg | ORAL_CAPSULE | Freq: Two times a day (BID) | ORAL | 0 refills | Status: AC
Start: 2015-04-05 — End: 2015-04-15

## 2015-04-05 NOTE — Progress Notes (Signed)
CC: test of cure, follow-up for urinary tract infection    HPI: Rachel House is a 64 y.o. woman of Dr. Marja Kays.  Dr. Ivery Quale has been following her for urinary tract infections.  The patient has been on Macrobid prophylaxis in the past.  Now she currently takes cranberry extract.  She was seen in the emergency room at the end of August.  She had a negative urine culture at that time.  The patient's symptoms consist of urinary frequency.  She denies dysuria or fevers.  The patient also has lower back pain.  She notes she has a herniated disc.  Patient has all over body pain and sometimes thinks this is associated with her bladder.  The patient reports going to the restroom 3 or 4 times today.    Vitals:    04/05/15 1327   BP: 122/84   Temp: 36.8 C (98.2 F)   Weight: 94.3 kg (208 lb)   Height: 1.613 m (5' 3.5")       General: No apparent distress. Alert & oriented x 3.   Abdomen: Soft.  Nontender.  Nondistended.    Vulvar: Normal external genitalia.  Symmetric labia.    Vagina: No epithelial lesions or pigmentation changes.      LABORATORY: Because she reports difficulty collecting uncontaminated urine sample, we decided to proceed with catheterization.  Using sterile technique and a # 8 pediatric catheter, we catheterized her for approximately 200 cc of urine.    URINE DIPSTICK: SG 1.015, pH 5, leukocytes positive, nitrites negative, protein negative, glucose negative, ketones negative, urobilinogen < 1, bilirubin negative, blood negative.     Assessment:  1. Frequent urinary tract infections  --Status post normal cystoscopy  --s/p prophylactic Macrobid 100 mg daily, breakthrough urinary tract infections resistant to [facility]  March 21, 2015  negative urine culture  March 17, 2015  E. coli, sensitive to ampicillin, treated with Macrobid  February 07, 2015    E. coli, pansensitive  July 14, 2014  Klebsiella, resistant to ampicillin, Macrobid  April 06, 2014   negative urine culture  May 25, 2013    Klebsiella, resistant to ampicillin and Macrobid, intermediate Cipro  February 11, 2013     Klebsiella, resistant to ampicillin and Macrobid, intermediate to Augmentin  July 02, 2012   E. coli, resistant to ampicillin, Augmentin, ciprofloxacin, gentamicin, levofloxacin, tetracycline and Bactrim  --cranberry extract  --declines vaginal estrogen cream, mother and daughter with breast cancer  2. Pelvic organ prolapse   --Stage II cystocele, asymptomatic  --Status post supracervical hysterectomy, sacral colpopexy  3. Urinary incontinence, resolved  --Status post TVT  --Urinary urgency, no incontinence    Plan:  A catheterized urine sample was collected and will be sent for culture.  The urinalysis done in clinic was positive for white blood cells.  Because she is going out of town this weekend, a prescription for Macrobid 100 mg by mouth twice a day 10 days was given to the patient.  I encouraged the patient consider going back on daily antibiotic prophylaxis.  She is not interested in doing that at this time.  She is going to [place] in January and may consider antibiotic prophylaxis at that time.  There is no recent imaging of her renal system.  We can consider getting a renal ultrasound or CT to rule out any renal calculi or other infecting source. All the patient's questions were answered.  She expressed understanding and satisfaction with the plan.

## 2015-04-08 LAB — AEROBIC CULTURE

## 2015-05-26 ENCOUNTER — Encounter: Payer: Self-pay | Admitting: Obstetrics and Gynecology

## 2015-05-26 ENCOUNTER — Ambulatory Visit: Payer: Self-pay | Admitting: Obstetrics and Gynecology

## 2015-05-26 VITALS — BP 140/81 | Ht 63.75 in | Wt 208.0 lb

## 2015-05-26 DIAGNOSIS — N3090 Cystitis, unspecified without hematuria: Secondary | ICD-10-CM

## 2015-05-26 DIAGNOSIS — N390 Urinary tract infection, site not specified: Secondary | ICD-10-CM

## 2015-05-26 MED ORDER — NITROFURANTOIN MONOHYD MACRO 100 MG PO CAPS *I*
100.0000 mg | ORAL_CAPSULE | Freq: Every day | ORAL | 0 refills | Status: AC
Start: 2015-05-26 — End: 2015-08-24

## 2015-05-26 NOTE — Progress Notes (Signed)
CC: test of cure, follow-up for urinary tract infection    HPI: Rachel House is a 64 y.o. woman being followed by Dr. Ivery QualeBuchsbaum for UTIs. The patient has been on Macrobid prophylaxis in the past and would like to go on it again. Now she currently takes cranberry extract. She was seen in the emergency room at the end of August. She had a negative urine culture at that time. The patient's symptoms consist of urinary frequency. She denies dysuria or fevers. The patient also has lower back pain, but it is not different from her chronic back pain. She notes she has a herniated disc. Patient has all over body pain and sometimes thinks this is associated with her bladder. The patient reports going to the restroom 3 or 4 times today.    Vitals:    05/26/15 1327   BP: 140/81   Weight: 94.3 kg (208 lb)   Height: 1.619 m (5' 3.75")     General: No apparent distress. Alert & oriented x 3.   Abdomen: Soft. Nontender. Nondistended.   Vulvar: Normal external genitalia. Symmetric labia.   Vagina: No epithelial lesions or pigmentation changes.     LABORATORY: Because she reports difficulty collecting uncontaminated urine sample, we decided to proceed with catheterization. Using sterile technique and a # 8 pediatric catheter, we catheterized her for approximately 200 cc of urine.    URINE DIPSTICK: SG 1.015, pH 5, leukocytes positive, nitrites positive protein negative, glucose negative, ketones negative, urobilinogen < 1, bilirubin negative, blood  positive .     Assessment:  1. Frequent urinary tract infections  --Status post normal cystoscopy  --s/p prophylactic Macrobid 100 mg daily, restarted on May 26, 2015   April 05, 2015 E.coli, resistant to Amp, Cipro, [place], Bactrim  March 21, 2015  negative urine culture  March 17, 2015  E. coli, sensitive to ampicillin, treated with Macrobid  February 07, 2015   E. coli, pansensitive  July 14, 2014  Klebsiella, resistant to ampicillin, Macrobid  April 06, 2014  negative  urine culture  May 25, 2013  Klebsiella, resistant to ampicillin and Macrobid, intermediate Cipro  February 11, 2013  Klebsiella, resistant to ampicillin and Macrobid, intermediate to Augmentin  July 02, 2012  E. coli, resistant to ampicillin, Augmentin, ciprofloxacin, gentamicin, levofloxacin, tetracycline and Bactrim  --cranberry extract  --declines vaginal estrogen cream, mother and daughter with breast cancer  2. Pelvic organ prolapse   --Stage II cystocele, asymptomatic  --Status post supracervical hysterectomy, sacral colpopexy  3. Urinary incontinence, resolved  --Status post TVT  --Urinary urgency, no incontinence    Plan:  A catheterized urine sample was collected and will be sent for culture. The urinalysis done in clinic was positive for white blood cells, RBCs and nitrites.a prescription for Macrobid 100 mg by mouth daily was called into the patient's pharmacy as a prophylactic coverage for her urinary tract infections.  The patient has mild symptoms today and although the urinalysis is positive for an acute urinary tract infection, I will not treated with a full course of antibiotics unless needed.   She has had a cystoscopy which was normal.  In the future we can consider a renal ultrasound or CT to rule out any renal calculi or other infecting source. All the patient's questions were answered. She expressed understanding and satisfaction with the plan.   she'll return to clinic in 3 months or sooner if needed.  She is scheduled to undergo a total knee replacement on June 26, 2015.

## 2015-05-28 LAB — AEROBIC CULTURE

## 2015-05-29 ENCOUNTER — Telehealth: Payer: Self-pay | Admitting: Obstetrics and Gynecology

## 2015-05-29 NOTE — Telephone Encounter (Signed)
The Pt's urine culture came back positive.  Macrobid sensitive.  She is currently on macrobid 100 mg po daily at a prophylaxis dose.  If the Pt is symptomatic of a UTI, she should take 100mg  po twice a day, otherwise, she can stay on her maintenance dose.  I called the Pt and got voicemail.  I left a message.

## 2015-06-14 ENCOUNTER — Ambulatory Visit: Payer: Self-pay | Admitting: Obstetrics and Gynecology

## 2015-09-12 ENCOUNTER — Telehealth: Payer: Self-pay

## 2015-09-12 NOTE — Telephone Encounter (Signed)
Is on Nitrofurantoin. Took last pill. She needs a refill on the medication, but is out of town in [place].  She would like a prescription mailed to her so she can take it to the pharmacy where she is at.    Garrett Bowring  [address]  [place], [place] [address]

## 2015-09-14 ENCOUNTER — Other Ambulatory Visit: Payer: Self-pay | Admitting: Obstetrics and Gynecology

## 2015-09-15 ENCOUNTER — Other Ambulatory Visit: Payer: Self-pay | Admitting: Obstetrics and Gynecology

## 2015-09-15 MED ORDER — NITROFURANTOIN MONOHYD MACRO 100 MG PO CAPS *I*
100.0000 mg | ORAL_CAPSULE | Freq: Every day | ORAL | 2 refills | Status: AC
Start: 2015-09-15 — End: 2015-12-14

## 2016-02-02 IMAGING — CR DG KNEE COMPLETE 4+V*R*
4 series · 4 of 4 positions shown · non-contrast
Comparison: None.

CLINICAL DATA: One year history of pain right knee, more severe
with standing

EXAM:
RIGHT KNEE - COMPLETE 4+ VIEW

[x knee ap right (1 of 3)]
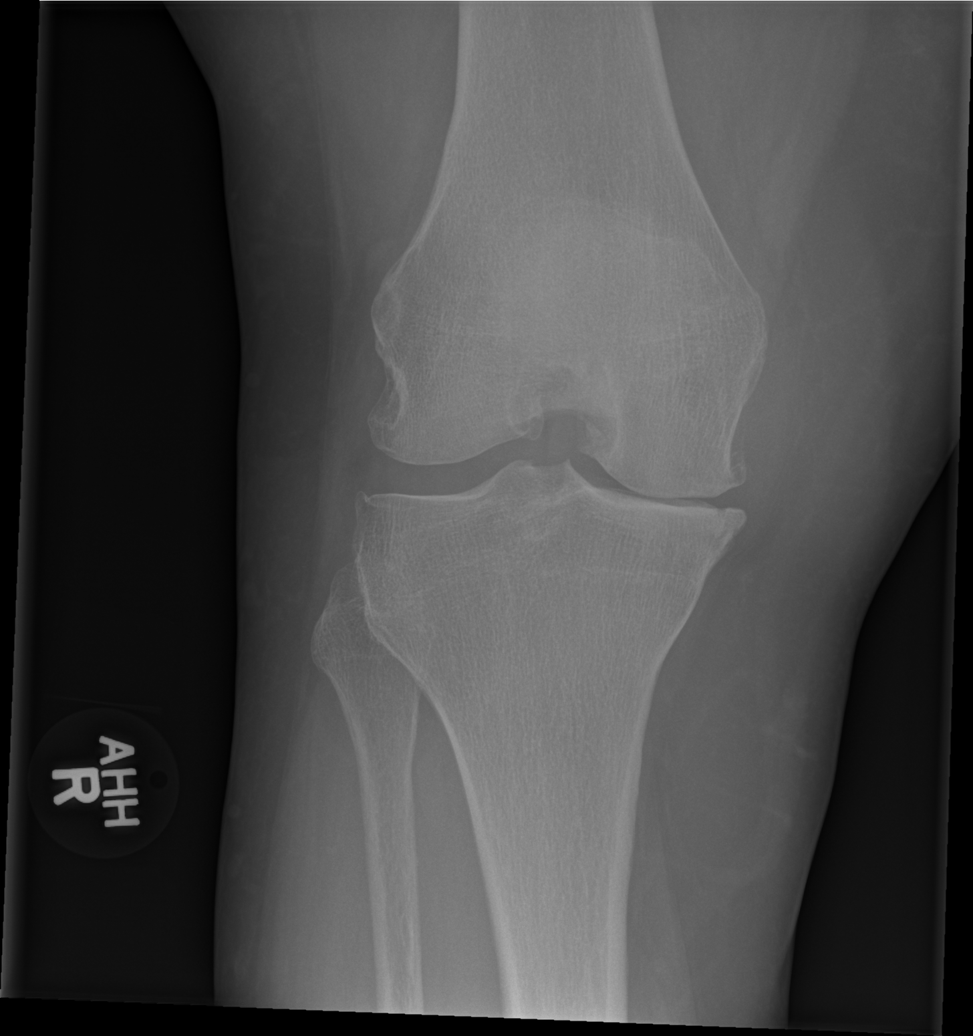

[x knee ap right (2 of 3)]
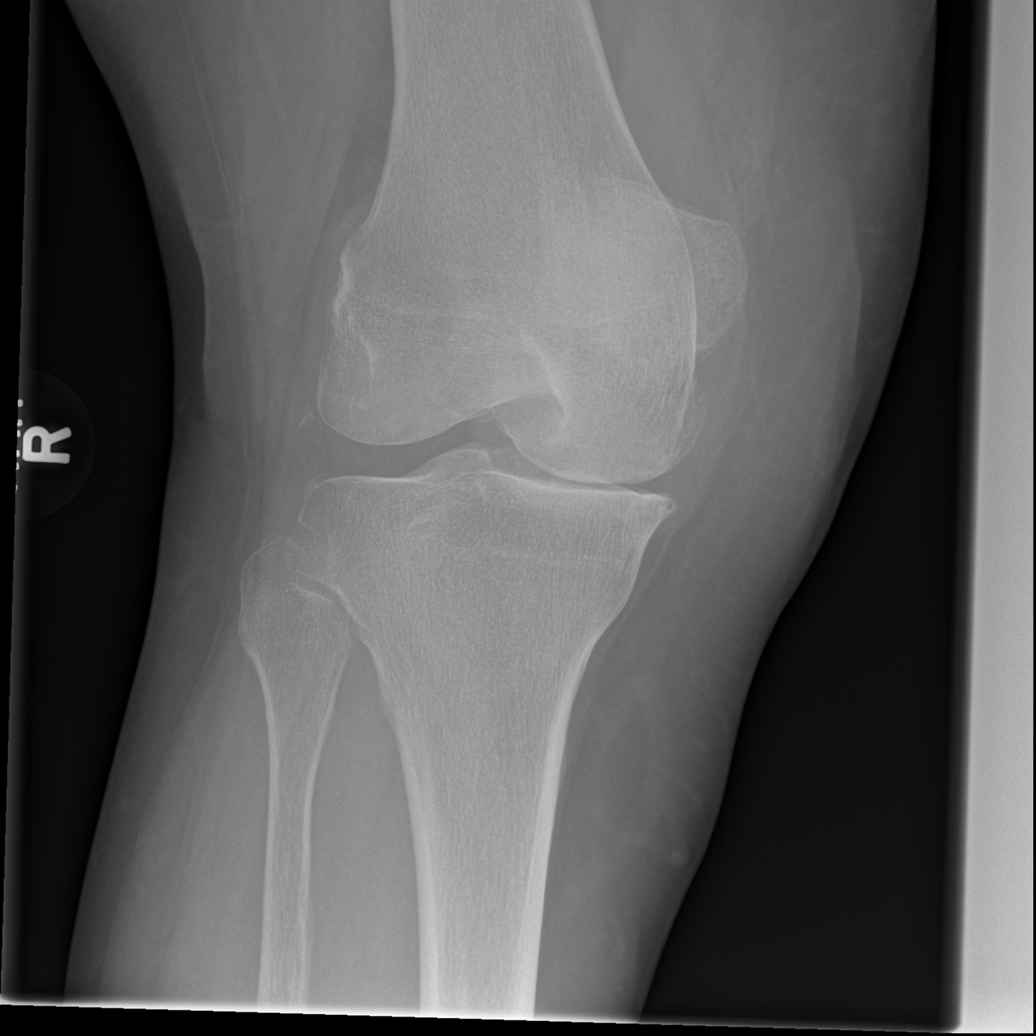

[x knee ap right (3 of 3)]
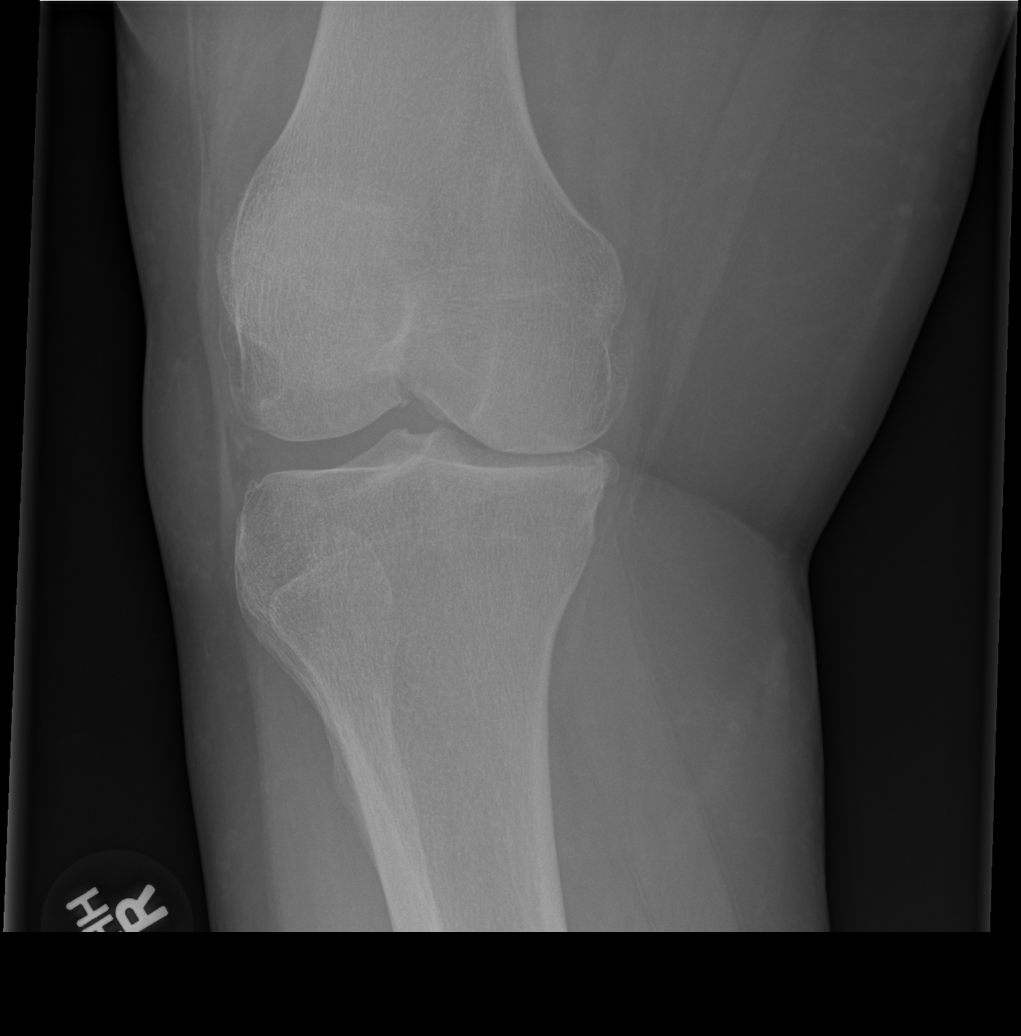

[x knee lat right]
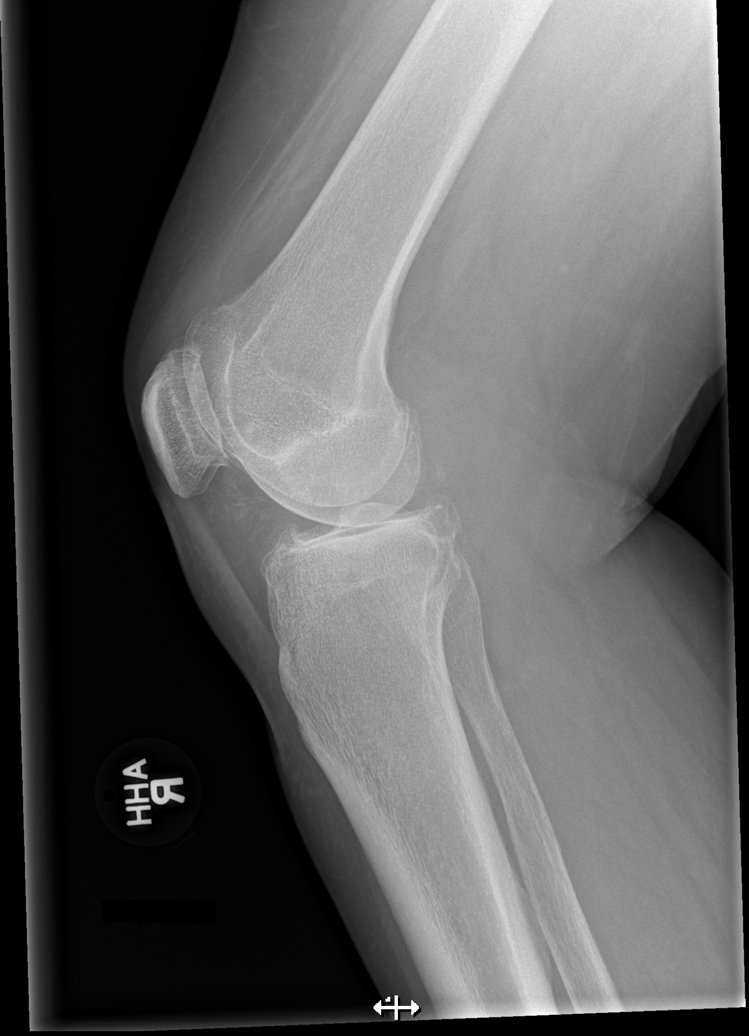

[4 of 4 positions shown; findings below may reference images not displayed]

FINDINGS: Frontal, lateral, and bilateral oblique views were obtained. There
is no fracture, dislocation, or effusion. There is marked narrowing
medially with spurring medially. There is slight spurring in the
patellofemoral joint and lateral compartment as well. No erosive
change.
IMPRESSION: Marked narrowing medially with osteoarthritis. Spurring in all
compartments, most marked medially. No fracture or effusion.

## 2016-12-04 ENCOUNTER — Ambulatory Visit
Admission: RE | Admit: 2016-12-04 | Discharge: 2016-12-04 | Disposition: A | Discharge status: Home / Self Care | Payer: Medicare (Managed Care) | Source: Ambulatory Visit | Attending: Orthopedic Surgery | Admitting: Orthopedic Surgery

## 2016-12-04 ENCOUNTER — Ambulatory Visit: Payer: Medicare (Managed Care) | Attending: Orthopedic Surgery | Admitting: Orthopedic Surgery

## 2016-12-04 ENCOUNTER — Ambulatory Visit: Payer: Self-pay | Admitting: Orthopedic Surgery

## 2016-12-04 ENCOUNTER — Ambulatory Visit: Payer: Medicare (Managed Care) | Admitting: Obstetrics and Gynecology

## 2016-12-04 ENCOUNTER — Encounter: Payer: Self-pay | Admitting: Orthopedic Surgery

## 2016-12-04 ENCOUNTER — Ambulatory Visit: Payer: Self-pay | Admitting: Obstetrics and Gynecology

## 2016-12-04 VITALS — Ht 64.0 in | Wt 216.0 lb

## 2016-12-04 VITALS — BP 156/98 | Ht 64.0 in | Wt 216.0 lb

## 2016-12-04 DIAGNOSIS — M19071 Primary osteoarthritis, right ankle and foot: Secondary | ICD-10-CM | POA: Insufficient documentation

## 2016-12-04 DIAGNOSIS — N390 Urinary tract infection, site not specified: Secondary | ICD-10-CM | POA: Insufficient documentation

## 2016-12-04 DIAGNOSIS — R399 Unspecified symptoms and signs involving the genitourinary system: Secondary | ICD-10-CM

## 2016-12-04 DIAGNOSIS — M2011 Hallux valgus (acquired), right foot: Secondary | ICD-10-CM | POA: Insufficient documentation

## 2016-12-04 DIAGNOSIS — M79671 Pain in right foot: Secondary | ICD-10-CM | POA: Insufficient documentation

## 2016-12-04 DIAGNOSIS — M2142 Flat foot [pes planus] (acquired), left foot: Secondary | ICD-10-CM

## 2016-12-04 DIAGNOSIS — M2141 Flat foot [pes planus] (acquired), right foot: Secondary | ICD-10-CM

## 2016-12-04 LAB — POCT URINALYSIS DIPSTICK
Bilirubin,Ur: NEGATIVE
Glucose,UA POCT: NORMAL mg/dL
Ketones,UA POCT: NEGATIVE mg/dL
Leuk Esterase,UA POCT: 1 — AB
Lot #: 22262702
Nitrite,UA POCT: NEGATIVE
PH,UA POCT: 5 (ref 5–8)
Protein,UA POCT: NEGATIVE mg/dL
Specific gravity,UA POCT: 1.015 (ref 1.002–1.030)
Urobilinogen,UA: NORMAL mg/dL

## 2016-12-04 MED ORDER — LIDOCAINE HCL 1 % IJ SOLN *I*
1.0000 mL | Freq: Once | INTRAMUSCULAR | Status: AC | PRN
Start: 2016-12-04 — End: 2016-12-04
  Administered 2016-12-04: 1 mL via INTRA_ARTICULAR

## 2016-12-04 MED ORDER — CIPROFLOXACIN HCL 500 MG PO TABS *I*
500.0000 mg | ORAL_TABLET | Freq: Two times a day (BID) | ORAL | 0 refills | Status: AC
Start: 2016-12-04 — End: 2016-12-09

## 2016-12-04 MED ORDER — BETAMETHASONE ACET & SOD PHOS 6 (3-3) MG/ML IJ SUSP *I*
6.0000 mg | Freq: Once | INTRAMUSCULAR | Status: AC | PRN
Start: 2016-12-04 — End: 2016-12-04
  Administered 2016-12-04: 11:00:00 6 mg via INTRA_ARTICULAR

## 2016-12-04 MED ORDER — BUPIVACAINE HCL 0.5 % IJ SOLUTION *WRAPPED*
2.0000 mL | Freq: Once | INTRAMUSCULAR | Status: AC | PRN
Start: 2016-12-04 — End: 2016-12-04
  Administered 2016-12-04: 2 mL via INTRA_ARTICULAR

## 2016-12-04 MED ORDER — ETHYL CHLORIDE EX AERO (MULTIPLE PATIENTS) *I*
1.0000 | INHALATION_SPRAY | Freq: Once | CUTANEOUS | Status: AC | PRN
Start: 2016-12-04 — End: 2016-12-04
  Administered 2016-12-04: 1 via TOPICAL

## 2016-12-04 NOTE — Progress Notes (Signed)
Nursing Assessment:   Cecille Aver, RN    Reason for Visit: UTI Check    Rachel House is a 66 y.o. female presenting for complaints of urinary tract infection.    BP (!) 160/118 (BP Location: Right arm, Patient Position: Sitting, Cuff Size: adult)   Ht 1.626 m (5\' 4" )   Wt 98 kg (216 lb)   BMI 37.08 kg/m2 Temp: 97.7   BP recheck at end of visit 156/98 Patient will FU with PCP.  Patient Complaint of UTI symptoms with voiding: [x]  Urgency  [x]  Frequency [x]  Dysuria  [x]  Urine malodorous []  Cloudy []  Urine hesitancy [x]  Retention HX w/ UTI [] Gross hematuria describe: denies   [x]  Incontinence (increase) [x]  Back pain describe location: lower back and right side kidney region   []  Fever []  Fatigue [] Chills [x] Nausea comes and goes []  Vomiting [] History Kidney Stones   []  Mental status changes (confusion/irritability)    Patients symptoms began 3 months ago.There is a former history of frequent UTI's.The patient is not on any antibiotic prophylaxis at this time.    CVAT:  [x]  Positive on right side (chronic pain, present for 2 months)    Recent Urine Cultures:  Last was February 2018 [facility]  UTI Overview list:  05/26/15 Klebsiella   04/05/15     E coli  03/07/15     E coli  02/07/15   E coli  07/14/14 Klebsiella    Verbal permission was obtained for straight catheterization  Straight Catheterization Procedure:  Urethra cleansed with antimicrobial solution  8 French single-use catheter inserted into urethral meatus under aseptic technique  Urine specimen obtained. Bladder drained completely of 445 cc  Urine appearance: clear    POCT Urinalysis Results: Positive for leukocytes and trace blood  Recent Results (from the past 24 hour(s))   POCT urinalysis dipstick    Collection Time: 12/04/16  9:27 AM   Result Value Ref Range    Specific gravity,UA POCT 1.015 1.002 - 1.030    PH,UA POCT 5.0 5 - 8    Leuk Esterase,UA POCT +1 (!) Negative    Nitrite,UA POCT Negative Negative    Protein,UA POCT Negative Negative  mg/dL    Glucose,UA POCT Normal Normal mg/dL    Ketones,UA POCT Negative Negative mg/dL    Urobilinogen,UA Normal Less than 1 mg/dL    Bilirubin,Ur Negative Negative    Blood,UA POCT Trace (!) Negative    Exp date 06/27/17     Lot # [id number]        Patient Active Problem List   Diagnosis Code    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    Posterior tibial tendonitis M76.829    History of recurrent UTIs Z87.440    Vaginal prolapse N81.10    Cystitis N30.90    Urinary tract infection, site unspecified N39.0     Current Outpatient Prescriptions   Medication    ciprofloxacin (CIPRO) 500 MG tablet    ondansetron (ZOFRAN-ODT) 4 MG disintegrating tablet    diclofenac (VOLTAREN) 1 % gel    TURMERIC PO    KRILL OIL PO    niacin (NIASPAN) 500 MG CR tablet    B Complex-Folic Acid (B COMPLEX-VITAMIN B12 PO)    Misc Natural Products (PANTETHINE PLUS PO)    Melatonin 5 MG TBDP    lisinopril (PRINIVIL,ZESTRIL) 10 MG tablet    levothyroxine (SYNTHROID, LEVOTHROID) 75 MCG tablet    diclofenac potassium (CATAFLAM) 50 MG tablet    PREVALITE 4 GM/DOSE powder  Magnesium 500 MG CAPS    lidocaine (XYLOCAINE) 2 % jelly    Omega-3 Fatty Acids (FISH OIL) 1000 MG CAPS    Coenzyme Q-10 200 MG CAPS    pantoprazole (PROTONIX) 20 MG EC tablet    Ascorbic Acid (VITAMIN C) 500 MG tablet    Cholecalciferol (VITAMIN D) 2000 UNITS tablet    Cranberry 400 MG CAPS     No current facility-administered medications for this visit.          PROVIDER ASSESSMENT: The above nursing note and physical assessment was reviewed.        IMPRESSION/PLAN:  The patient was last seen in our clinic approximately 18 months ago.  She does have a history of urinary tract infections.  The patient feels as if she has had a urinary tract infection for the past 3 months.  She does not live in [place] for most of the year, (Lives in [place]).  Her symptoms include burning with urination and right upper back pain.  She has had the back pain for  approximately 2 months.  I do not believe she had CVA tenderness on my examination.  A catheterized urine sample was collected and will be sent for culture.  The patient desired and about antibiotic treatment today.  Ciprofloxacin 500 mg by mouth twice a day 5 days was written for the patient.  She'll follow-up as needed in the future.

## 2016-12-04 NOTE — Progress Notes (Signed)
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    SUBJECTIVE:  The patient returns today.  She has bilateral flat feet.  She previously had been seen in 2015 for left hindfoot arthritis.  Now, she is having some right-sided midfoot pain along the 1st TMT joint.  [place] and summers up here in [place].  She does have orthotics and shoes. She said when she wears orthotics and shoes, it almost makes it hurt more.  She comes in today with very flat flip-flop type shoes.  She said they are the most comfortable to her.    OBJECTIVE:  The patient has tenderness over her right 1st TMT joint.  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  Her contralateral left side is not particularly painful to her today.  She has a pes planus foot architecture on that side as well.    PHYSICAL EXAMINATION:  She is tender over the 1st TMT joint.  She has some swelling in that region.  Shucking of that joint does cause her pain.  She is neurovascularly intact.  She has a planovalgus foot deformity.  It is mildly stiff.  She has an old well-healed medial eminence incision from a previous bunion correction done elsewhere and she has a recurrent hallux valgus deformity.    IMAGING:  X-rays demonstrate some midfoot arthritis on her right side and a pes planus foot architecture.    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  At this stage, she would like to avoid any type of surgery and therefore after fully discussing the options, she has opted for a cortisone injection and that was  documented in a separate dictation.  Patient felt improved after the injection was performed and she will follow up here on a p.r.n. basis.             ______________________________  Zachary GeorgeJudith F Shantaya Bluestone, MD    JFB/MODL  DD:  12/04/2016 11:24:17  DT:  12/04/2016 12:38:08  Job #:  [id number]    cc:

## 2016-12-04 NOTE — Procedures (Signed)
Small Joint Aspiration/Injection Procedure  Date/Time: 12/04/2016 11:16 AM  Consent given by: patient  Site marked: site marked  Timeout: Immediately prior to procedure a time out was called to verify the correct patient, procedure, equipment, support staff and site/side marked as required     Procedure Details  Location: Foot - L intertarsal  Preparation: The site was prepped using the usual aseptic technique.  Anesthetics administered: 1 spray ethyl chloride; 2 mL bupivacaine 0.5%; 1 mL lidocaine 1 %  Intra-Articular Steroids administered: 6 mg betamethasone acetate & sodium phosphate 6 (3-3) MG/ML  Dressing:  A dry, sterile dressing was applied.  Patient tolerance: patient tolerated the procedure well with no immediate complications

## 2016-12-05 ENCOUNTER — Ambulatory Visit: Payer: Self-pay | Admitting: Orthopedic Surgery

## 2016-12-06 LAB — AEROBIC CULTURE

## 2018-06-01 ENCOUNTER — Telehealth: Payer: Self-pay

## 2018-06-01 NOTE — Telephone Encounter (Signed)
Faxed all records from [facility] to [facility] fax# [phone]

## 2018-07-15 DIAGNOSIS — M797 Fibromyalgia: Secondary | ICD-10-CM | POA: Insufficient documentation

## 2018-07-15 DIAGNOSIS — G4730 Sleep apnea, unspecified: Secondary | ICD-10-CM | POA: Insufficient documentation

## 2018-07-30 DIAGNOSIS — E669 Obesity, unspecified: Secondary | ICD-10-CM | POA: Insufficient documentation

## 2018-07-30 DIAGNOSIS — Z96652 Presence of left artificial knee joint: Secondary | ICD-10-CM | POA: Insufficient documentation

## 2018-09-02 ENCOUNTER — Ambulatory Visit: Payer: Medicare (Managed Care) | Attending: Physician Assistant

## 2018-09-02 ENCOUNTER — Ambulatory Visit
Admission: RE | Admit: 2018-09-02 | Discharge: 2018-09-02 | Disposition: A | Discharge status: Home / Self Care | Payer: Medicare (Managed Care) | Source: Ambulatory Visit | Attending: Physician Assistant | Admitting: Physician Assistant

## 2018-09-02 ENCOUNTER — Encounter: Payer: Self-pay | Admitting: Orthopedic Surgery

## 2018-09-02 ENCOUNTER — Other Ambulatory Visit: Payer: Medicare (Managed Care) | Admitting: Physician Assistant

## 2018-09-02 ENCOUNTER — Ambulatory Visit: Payer: Medicare (Managed Care) | Admitting: Orthopedic Surgery

## 2018-09-02 VITALS — BP 117/58 | HR 101 | Ht 63.0 in | Wt 211.7 lb

## 2018-09-02 DIAGNOSIS — M19079 Primary osteoarthritis, unspecified ankle and foot: Secondary | ICD-10-CM

## 2018-09-02 DIAGNOSIS — M2141 Flat foot [pes planus] (acquired), right foot: Secondary | ICD-10-CM

## 2018-09-02 DIAGNOSIS — M76822 Posterior tibial tendinitis, left leg: Secondary | ICD-10-CM

## 2018-09-02 DIAGNOSIS — M19072 Primary osteoarthritis, left ankle and foot: Secondary | ICD-10-CM | POA: Insufficient documentation

## 2018-09-02 DIAGNOSIS — M79672 Pain in left foot: Secondary | ICD-10-CM | POA: Insufficient documentation

## 2018-09-02 DIAGNOSIS — M2142 Flat foot [pes planus] (acquired), left foot: Secondary | ICD-10-CM | POA: Insufficient documentation

## 2018-09-02 NOTE — Progress Notes (Signed)
Problem: Left foot pain    Subjective: Patient is previously seen for both feet presents for left foot pain.  She does have known midfoot arthritis and presents planus foot alignment.  She states that the ankle brace she has does help but is worn out.  She does have custom inserts that are several years old and questions if she needs new inserts.  She denies any new injury or trauma.  She states that most of her pain is at the lateral ankle.  She does have some numbness into the toes.  She did have a left total knee arthroplasty 4 weeks ago.    Review of Systems 12 point, Social history, Family history:  Encounter information and details reviewed per separate area in chart; reviewed with patient.    BP 117/58    Pulse 101    Ht 1.6 m (5\' 3" ) Comment: stated   Wt 96 kg (211 lb 11.2 oz) Comment: actual   BMI 37.50 kg/m        Objective: Patient is alert and oriented, well-developed and well-nourished in no acute distress.  She is neurovascularly intact with palpable pulses.  She does have a pes planus foot architecture.  She does have some subfibular pain in the region of the subtalar sinus tarsi region.  Minimal tenderness at the midfoot.  She does have some mild stiffness of the hindfoot.    Imaging: Left foot weight-bearing x-rays are obtained personally reviewed.  No acute fracture or dislocation noted.  Midfoot arthrosis noted.  Pes planus architecture.    Assessment: Pes planus and midfoot arthritis    Plan: As the ankle brace was helpful in the past we will provide her with a new ankle brace today.  We will organize her to go to orthotics and prosthetics to have a UCBL type orthotic molded.  As she is moving to [place] in about 2 weeks she will pick up the insert in June when she is back in town for follow-up of her knee.  She'll follow up on an as-needed basis.    Andrez Grime PA-C  Department of Orthopaedics  [facility]    ATTENDING ADDENDUM OF NP/PA NOTE:  I saw and  evaluated the patient and discussed the care with Willaim Sheng, PA. I agree with the findings and plan as documented in the note above.  I have also added additional documentation to the note.

## 2018-09-03 NOTE — Progress Notes (Signed)
Foot and Ankle Orthotic Evaluation     There were no vitals filed for this visit.      Subjective:    Date of Birth: 02-Dec-1950  Sex: female  Primary Diagnosis:     ICD-10-CM ICD-9-CM   1. Posterior tibial tendinitis of left lower extremity M76.822 726.72   2. 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 716.97       Past Medical History:   Diagnosis Date    Arthritis     Fibromyalgia     High cholesterol     Hypertension     Thyroid disease          Previous Orthotic Management:  Yes foot levelers- chiropractor  Patient's Primary Complaint: lateral ankle pain  Activity Level: Average    Pt. states goals for orthotic management are: decrease pain.    Reports using ambulating without assistive devices for mobility at home.      Discomfort:  Yes   Description of Discomfort:   []  burning  []  itching []  pulling []  numbness   []  tingling []  sharp pain [x]  dull pain   []  none   Comments:      Objective:  Patient presents to appointment using:  ambulating without assistive devices       Foot Evaluation   Left Right   Flexibility  1=Rigid, 5 = Flexible 3 3   Alignment pronation pronation   Sensation Normal: bilateral Normal: bilateral   Skin []  dry  []  moist  []  discoloration   []  cool  []  warm  []  tight  []  loose  []  open sore  normal []  dry  []  moist  []  discoloration   []  cool  []  warm  []  tight  []  loose  []  open sore  normal   Other  (provide narrative details) []  Bunion    []  Callus  []  Ulcer   []  Amputation  []  Hammer toe  []  Other      []  Bunion    []  Callus  []  Ulcer   []  Amputation  []  Hammer toe  []  Other                      Discussed proper footwear: yes  Shoe Type: Athletic    Other:     Outcomes:     See PROMIS Outcomes Tab    Goals:     Wear orthoses and shoes daily   Remain free from falls   Remain free from injury to foot (callus / ulceration)    Assessment:    Treatment Objectives: Accommodate   Recomendations: Patient referred for appropriate footwear.  Procedures:   Foam box impression  left.    PLAN     Type:  Multi-Durameter  Left.    Length:  Full Left.    Posting/Base Materials:  Thermocork Left.      Intermediate Layer:  Quickform    Top Cover:  Plastizote Left.    Special Modifications:  Deep Heel Cup Left.      Posts/Wedges:  Heel:  Not Applicable  Forefoot:  Not Applicable    Special Instructions:     Plan:   Patient will call for fit and delivery when she is home from the [facility]  Patient will return in 3 weeks for ORD (Shoes and Inserts)   Contact information to call with any questions or concerns provided.

## 2018-09-03 NOTE — Progress Notes (Signed)
Serial Number: [id number]  [facility] Fabrication Work Order  Foot Orthotics   SERIAL NUMBER:  [id number]   Patient Name: Rachel House  MRN : [id number]  Clinician: Elaina Hoops   Side:Left     Type: Modified UCBL   Modifications: Lab standard   Materials:   Top:   Cheron Schaumann Intermediate:   Quickform Base:   Thermocork   Special Instructions: PQ navicular          []  ALL PARTS ORDERED: None needed at this time    Next Scheduled Visit in Specialty:   10/01/18

## 2018-09-03 NOTE — Progress Notes (Signed)
O&P AUTHORIZATION REQUEST FORM    Name: Rachel House  DOB:  Feb 20, 1951    Patient Activity Level:  moderate  Side:  left    Diagnosis:      ICD-10-CM ICD-9-CM   1. Posterior tibial tendinitis of left lower extremity M76.822 726.72   2. 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   Custom foot orthoses are appropriate when off the shelf orthoses can not address the patients needs       Modified UCBL FO.  Designed to provide control of hindfoot and re-distribute weight that is experienced during ambulation

## 2018-09-22 ENCOUNTER — Ambulatory Visit: Payer: Self-pay

## 2018-09-22 NOTE — Progress Notes (Signed)
Quality Assurance/ Safety Checkout: Foot Orthotics    Serial Number: [id number]  Type: new  Side: left  Device Type: fo  Clinician: holly sherwood    Materials:  Yes, Material: Proper thickness and type       Yes, Pull Quality: Matches model and shape      Yes, Matches expected trimlines        Yes, Edges: buffed smooth, free of waves       Finishing  N/A, Padding: smooth bevels, free of scuffs, secure    Yes, Posting, proper material and alignment    Deficiencies:   N/A, Device Shows evidence of outside modification    N/A, Recommend immediate repairs before use    N/A, Recommend routine repairs    Yes, Ready for use    Yes, Meets overall quality standards    Comments: none    Rachel House    Tell us how we did  [email or web address]

## 2018-09-24 ENCOUNTER — Other Ambulatory Visit: Payer: Self-pay

## 2018-09-24 ENCOUNTER — Emergency Department (HOSPITAL_COMMUNITY)
Admission: EM | Admit: 2018-09-24 | Discharge: 2018-09-24 | Disposition: A | Discharge status: Home / Self Care | Payer: Medicare Other | Attending: Emergency Medicine | Admitting: Emergency Medicine

## 2018-09-24 ENCOUNTER — Encounter (HOSPITAL_COMMUNITY): Payer: Self-pay | Admitting: Emergency Medicine

## 2018-09-24 DIAGNOSIS — Z79899 Other long term (current) drug therapy: Secondary | ICD-10-CM | POA: Insufficient documentation

## 2018-09-24 DIAGNOSIS — M79605 Pain in left leg: Secondary | ICD-10-CM

## 2018-09-24 DIAGNOSIS — I10 Essential (primary) hypertension: Secondary | ICD-10-CM | POA: Diagnosis not present

## 2018-09-24 DIAGNOSIS — Z96652 Presence of left artificial knee joint: Secondary | ICD-10-CM | POA: Diagnosis not present

## 2018-09-24 LAB — CBC WITH DIFFERENTIAL/PLATELET
Abs Immature Granulocytes: 0.02 10*3/uL (ref 0.00–0.07)
BASOS ABS: 0 10*3/uL (ref 0.0–0.1)
BASOS PCT: 1 %
EOS ABS: 0.6 10*3/uL — AB (ref 0.0–0.5)
Eosinophils Relative: 9 %
HCT: 37.8 % (ref 36.0–46.0)
Hemoglobin: 11.5 g/dL — ABNORMAL LOW (ref 12.0–15.0)
Immature Granulocytes: 0 %
Lymphocytes Relative: 24 %
Lymphs Abs: 1.5 10*3/uL (ref 0.7–4.0)
MCH: 27.3 pg (ref 26.0–34.0)
MCHC: 30.4 g/dL (ref 30.0–36.0)
MCV: 89.8 fL (ref 80.0–100.0)
Monocytes Absolute: 0.7 10*3/uL (ref 0.1–1.0)
Monocytes Relative: 11 %
NRBC: 0 % (ref 0.0–0.2)
Neutro Abs: 3.4 10*3/uL (ref 1.7–7.7)
Neutrophils Relative %: 55 %
PLATELETS: 233 10*3/uL (ref 150–400)
RBC: 4.21 MIL/uL (ref 3.87–5.11)
RDW: 13.7 % (ref 11.5–15.5)
WBC: 6.1 10*3/uL (ref 4.0–10.5)

## 2018-09-24 LAB — BASIC METABOLIC PANEL
ANION GAP: 9 (ref 5–15)
BUN: 31 mg/dL — ABNORMAL HIGH (ref 8–23)
CO2: 23 mmol/L (ref 22–32)
CREATININE: 0.93 mg/dL (ref 0.44–1.00)
Calcium: 9.9 mg/dL (ref 8.9–10.3)
Chloride: 108 mmol/L (ref 98–111)
GFR calc non Af Amer: >60 mL/min (ref >60)
Glucose, Bld: 105 mg/dL — ABNORMAL HIGH (ref 70–99)
Potassium: 4.6 mmol/L (ref 3.5–5.1)
Sodium: 140 mmol/L (ref 135–145)

## 2018-09-24 MED ORDER — CEPHALEXIN 500 MG PO CAPS: 500.0000 mg | ORAL_CAPSULE | Freq: Four times a day (QID) | ORAL | 0 refills | Status: DC

## 2018-09-24 MED ORDER — NAPROXEN 375 MG PO TABS
375.0000 mg | ORAL_TABLET | Freq: Once | ORAL | Status: AC
Start: 2018-09-24 — End: 2018-09-24
  Administered 2018-09-24: 375 mg via ORAL
  Filled 2018-09-24: qty 1

## 2018-09-24 MED ORDER — NAPROXEN 375 MG PO TABS: 375.0000 mg | ORAL_TABLET | Freq: Two times a day (BID) | ORAL | 0 refills | Status: DC

## 2018-09-24 NOTE — ED Triage Notes (Signed)
Pt reports she had left total knee replacement 8 weeks ago up [place] where patient lives. Reports had left leg pains since. Pt saw chiropractor and advised patient to come have leg checked out due to red and hot.

## 2018-09-24 NOTE — Discharge Instructions (Signed)
We saw you in the ER for the leg swelling and pain. Ultrasound has been ordered to rule out a blood clot. Please follow the instructions provided to get the study completed.  Take the antibiotics only if the blood clot study is normal. See your primary care doctor in 1 week.  Return to the ER if the symptoms get worse. If there is a blood clot, then the facility will advise the next steps.

## 2018-09-25 ENCOUNTER — Ambulatory Visit (HOSPITAL_COMMUNITY)
Admission: RE | Admit: 2018-09-25 | Discharge: 2018-09-25 | Disposition: A | Discharge status: Home / Self Care | Payer: Medicare Other | Source: Ambulatory Visit | Attending: Emergency Medicine | Admitting: Emergency Medicine

## 2018-09-25 DIAGNOSIS — R609 Edema, unspecified: Secondary | ICD-10-CM | POA: Diagnosis not present

## 2018-09-25 DIAGNOSIS — M7989 Other specified soft tissue disorders: Secondary | ICD-10-CM | POA: Insufficient documentation

## 2018-09-25 NOTE — ED Provider Notes (Signed)
[facility]-EMERGENCY DEPT Provider Note   CSN: [id number] Arrival date & time: 09/24/18  1840    History   Chief Complaint Chief Complaint  Patient presents with  . Leg Pain    HPI Glori Greely is a 68 y.o. female.     HPI 68 year old female comes into the ER with chief complaint of leg pain.  Patient has history of hypertension, hyperlipidemia and she had knee replacement in her left lower extremity about 6 months ago.  Patient states that ever since she had the knee surgery she has had left leg pain.  Over the past 2 weeks she has noted increased pain.  Today she went to a chiropractor for adjustment of her back, and he or she recommended she come to the ER for further evaluation.  Patient denies any history of DVT.  She also states that there is no associated nausea, vomiting, fevers, chills.  Patient lives in [place] and has made couple of trips to [place] over the past 6 months by car.  Past Medical History:  Diagnosis Date  . Arthritis   . High cholesterol   . Hypertension   . Sleep apnea     There are no active problems to display for this patient.   Past Surgical History:  Procedure Laterality Date  . ABDOMINAL HYSTERECTOMY    . BREAST BIOPSY    . BUNIONECTOMY    . MEDIAL PARTIAL KNEE REPLACEMENT       OB History   No obstetric history on file.      Home Medications    Prior to Admission medications   Medication Sig Start Date End Date Taking? Authorizing Provider  acetaminophen (TYLENOL) 650 MG CR tablet Take 1,300 mg by mouth 2 (two) times daily.   Yes [provider]  Cholecalciferol (VITAMIN D PO) Take 8,000 Units by mouth at bedtime.   Yes [provider]  Coenzyme Q10 (CO Q-10) 200 MG CAPS Take 400 mg by mouth at bedtime.   Yes [provider]  levothyroxine (SYNTHROID, LEVOTHROID) 75 MCG tablet Take 75 mcg by mouth daily before breakfast.   Yes [provider]  lisinopril  (PRINIVIL,ZESTRIL) 10 MG tablet Take 10 mg by mouth daily.   Yes [provider]  MAGNESIUM PO Take 400 mg by mouth daily.   Yes [provider]  Melatonin 10 MG TABS Take 1 tablet by mouth at bedtime.   Yes [provider]  Omega-3 Krill Oil 500 MG CAPS Take 1,500 mg by mouth at bedtime.   Yes [provider]  pantoprazole (PROTONIX) 40 MG tablet Take 40 mg by mouth daily as needed (indigestion).   Yes [provider]  pravastatin (PRAVACHOL) 10 MG tablet Take 10 mg by mouth daily. 09/17/18  Yes [provider]  Turmeric (CURCUMIN 95 PO) Take 1 tablet by mouth 2 (two) times daily.   Yes [provider]  VITAMIN B COMPLEX-C CAPS Take 1 capsule by mouth 2 (two) times daily.   Yes [provider]  vitamin C (ASCORBIC ACID) 500 MG tablet Take 500 mg by mouth 2 (two) times daily.   Yes [provider]  cephALEXin (KEFLEX) 500 MG capsule Take 1 capsule (500 mg total) by mouth 4 (four) times daily. 09/24/18   Derwood Kaplan, MD  naproxen (NAPROSYN) 375 MG tablet Take 1 tablet (375 mg total) by mouth 2 (two) times daily. 09/24/18   Derwood Kaplan, MD  traMADol (ULTRAM) 50 MG tablet Take 1  tablet (50 mg total) by mouth every 6 (six) hours as needed. Patient not taking: Reported on 09/24/2018 09/14/14   Azalia Bilisampos, Kevin, MD    Family History No family history on file.  Social History Social History   Tobacco Use  . Smoking status: Never Smoker  . Smokeless tobacco: Never Used  Substance Use Topics  . Alcohol use: Yes    Comment: occ  . Drug use: No     Allergies   Morphine and related; Codeine; Ivp dye [iodinated diagnostic agents]; Phenobarbital; Sulfa antibiotics; and Vicodin [hydrocodone-acetaminophen]   Review of Systems Review of Systems  Constitutional: Positive for activity change.  Musculoskeletal: Positive for myalgias.  Skin: Positive for rash.  Allergic/Immunologic: Negative for immunocompromised  state.  Hematological: Does not bruise/bleed easily.     Physical Exam Updated Vital Signs BP (!) 154/95 (BP Location: Left Arm)   Pulse 84   Temp 98.7 F (37.1 C) (Oral)   Resp 19   SpO2 99%   Physical Exam Vitals signs and nursing note reviewed.  Constitutional:      Appearance: She is well-developed.  HENT:     Head: Normocephalic and atraumatic.  Neck:     Musculoskeletal: Normal range of motion and neck supple.  Cardiovascular:     Rate and Rhythm: Normal rate.  Pulmonary:     Effort: Pulmonary effort is normal.  Abdominal:     General: Bowel sounds are normal.  Musculoskeletal:     Left lower leg: Edema present.     Comments: Patient is noted to have mild edema of the left lower extremity without any pitting.  She has rubor, calor and dolor of that side.  Intact distal pulse.  Skin:    General: Skin is warm and dry.  Neurological:     Mental Status: She is alert and oriented to person, place, and time.      ED Treatments / Results  Labs (all labs ordered are listed, but only abnormal results are displayed) Labs Reviewed  CBC WITH DIFFERENTIAL/PLATELET - Abnormal; Notable for the following components:      Result Value   Hemoglobin 11.5 (*)    Eosinophils Absolute 0.6 (*)    All other components within normal limits  BASIC METABOLIC PANEL - Abnormal; Notable for the following components:   Glucose, Bld 105 (*)    BUN 31 (*)    All other components within normal limits    EKG None  Radiology No results found.  Procedures Procedures (including critical care time)  Medications Ordered in ED Medications  naproxen (NAPROSYN) tablet 375 mg (375 mg Oral Given 09/24/18 2127)     Initial Impression / Assessment and Plan / ED Course  I have reviewed the triage vital signs and the nursing notes.  Pertinent labs & imaging results that were available during my care of the patient were reviewed by me and considered in my medical decision making (see chart  for details).        68 year old female with history of left-sided knee replacement surgery 6 months ago comes in with leg pain and swelling on the left side.  Clinical concerns are high for DVT. Minus to get ultrasound DVT ordered as an outpatient.  If the DVT study is negative then she can start taking Keflex that has been prescribed for likely cellulitis.  Outside of DVT and cellulitis differential also includes Baker's cyst and superficial thrombophlebitis.  Labs are overall reassuring.  Final Clinical Impressions(s) / ED Diagnoses  Final diagnoses:  Left leg pain    ED Discharge Orders         Ordered    LE VENOUS     09/24/18 2109    cephALEXin (KEFLEX) 500 MG capsule  4 times daily,   Status:  Discontinued     09/24/18 2112    naproxen (NAPROSYN) 375 MG tablet  2 times daily,   Status:  Discontinued     09/24/18 2112    cephALEXin (KEFLEX) 500 MG capsule  4 times daily     09/24/18 2116    naproxen (NAPROSYN) 375 MG tablet  2 times daily     09/24/18 2116           Derwood KaplanNanavati, Lyndee Herbst, MD 09/25/18 0011

## 2018-09-25 NOTE — Progress Notes (Signed)
Left lower ext duplex  has been completed. Refer to [facility] under chart review to view preliminary results.   09/25/2018  10:11 AM Elizabeth Roman, Elizabeth Roman

## 2018-12-17 ENCOUNTER — Telehealth: Payer: Self-pay

## 2018-12-17 NOTE — Telephone Encounter (Addendum)
LVM for patient regarding message below.  ----- Message from Rodney Booze sent at 12/17/2018 10:21 AM EDT -----  Regarding: Needs ORD  Please schedule ORD with Jeanice Lim, Let me know outcome.

## 2019-01-18 ENCOUNTER — Ambulatory Visit: Payer: Self-pay

## 2019-01-19 ENCOUNTER — Ambulatory Visit: Payer: Self-pay | Attending: Physician Assistant

## 2019-01-19 DIAGNOSIS — M76822 Posterior tibial tendinitis, left leg: Secondary | ICD-10-CM | POA: Insufficient documentation

## 2019-01-19 DIAGNOSIS — M19079 Primary osteoarthritis, unspecified ankle and foot: Secondary | ICD-10-CM | POA: Insufficient documentation

## 2019-01-19 NOTE — Patient Instructions (Signed)
Patient Instructions for Custom Foot Orthoses  Purpose of device   • A foot orthosis (foot orthotic, FO) is a device that is fabricated to fit inside a shoe that alters or assists with a persons' gait pattern. Also, it can be fabricated for the purpose of pressure relief. Foot orthoses can be very effective in the prevention of ulceration, or of off-loading a painful area.   • The purpose of a foot orthosis can be as wide-ranged as the materials that are used to fabricate them. This material selection depends on what the FO is designed to accomplish. Orthotic materials range from very rigid, to semi rigid, to very soft.  • Your Orthotist/ Pedorthist will select the materials best suited to meet your needs.    Wear Instructions  • Always wear clean, non-elastic cotton socks with your shoes.  • Appropriate shoes are vital to the success of your inserts.   Wear shoe with laces or velcro closures, which are preferred to loafers or slip-ons.    • It may be necessary to get a larger shoe, anywhere from ½ to a 1½ size larger than your current shoe size to accommodate for the increased thickness of the inserts.  • Running shoes with a mild rocker bottom and/or wide shoes with a high toe box, and/or removable insoles are optimal.  • It is important for everyone to watch for warning signs of ill-fitting FO’s and/or shoes.  • It is especially critical for people with diabetes, neuropathy or arthritis to vigilantly follow these fundamentals:  ? Break in your new FO by wearing it 1-2 hours for you first day then gently increase wear time by and hour or two until you are up to a full day in (approximately) 1- 2 weeks.  ? Every time you remove your shoes and the FO it is vital to check your skin for redness, blisters or pressure points.    ? If you see any reddened areas or blisters and you are diabetic or have neuropathy discontinue wearing the FO and contact our office immediately.  ? If you are not diabetic, or have  neuropathy, and do not have blisters, confirm that any reddened areas disappears within 30 minutes prior to reapplying the FO’s and increasing wear time.    Donning   • Place foot into shoe with your foot orthosis and fasten securely.  • Make sure laces are comfortably snug to anchor your feet to the shoe so that they work together as one unit.  • When transferring FO’s, make sure they are seated flat within the shoes before wearing.    Cleaning and Care  • The orthosis may be gently wiped clean (inside and out) with a soft cloth using water and a mild detergent. Rinse thoroughly and towel dry. It can be immediately placed back into you shoe and worn.  The material will not absorb any liquid.    Frequency / Duration of use  Your physician will indicate the frequency/duration of use for your custom inserts.    Potential Risks / Benefits  The overall benefit of your inserts will depend on compliance. It is important to follow up with your physician to determine whether optimal outcomes have been achieved.    How to report potential failure/malfunction   Contact your Orthotist/Pedorthist with any concerns regarding fit at [phone]

## 2019-01-22 NOTE — Progress Notes (Signed)
Modified UCBL  Delivery Encounter    Patient Name: Rachel House     Date: 01/22/2019    Subjective:  Changes since last visit:   none    ICD-10-CM ICD-9-CM   1. Posterior tibial tendinitis of left lower extremity  M76.822 726.72   2. Arthritis of foot  M19.079 716.97       Objective:  Device design details (Individual items and justification for use):   Left modified UCBL  Adjustments needed in order to use:   Trimmed to foot wear  Functional Assessment of Device   Contours match foot anatomy  Objectives of device use:   Decrease foot pain and pronation  Items Delivered:   The following items were delivered/provided today:      Device  Side: Left  Size:: custom  Model:: modified UCBL  Manufacturer:   Warranty:: 90 Days  Quantity: 1  Status: Delivered        Goals:   Decrease foot pain and pronation    Assessment:      x Device fit and function are appropriate    x Device was inspected for safety and security and found to be functioning properly    The patient given verbal and written instructions.  The following Instructions were reviewed:   [x]  yes []  no  Purpose of device   [x]  yes []  no Cleaning / care of device   [x]  yes []  no Potential Risks / Benefits   [x]  yes []  no Frequency / Duration of use   [x]  yes []  no How to report potential failure / malfunctions    Plan:  Return for routine follow up in 1 month(s) or as needed    Patient should return before scheduled appointment if:   Patient experiences excessive discomfort after a gradual break-in process   The device breaks or seems unstable   Redness lasts more than 20 minutes after wearing   Patient is experiencing difficulty wearing as prescribed

## 2019-08-18 DIAGNOSIS — M7062 Trochanteric bursitis, left hip: Secondary | ICD-10-CM | POA: Diagnosis not present

## 2019-08-18 DIAGNOSIS — Z471 Aftercare following joint replacement surgery: Secondary | ICD-10-CM | POA: Diagnosis not present

## 2019-08-18 DIAGNOSIS — Z96651 Presence of right artificial knee joint: Secondary | ICD-10-CM | POA: Diagnosis not present

## 2019-08-18 DIAGNOSIS — Z96653 Presence of artificial knee joint, bilateral: Secondary | ICD-10-CM | POA: Diagnosis not present

## 2019-08-18 DIAGNOSIS — Z96652 Presence of left artificial knee joint: Secondary | ICD-10-CM | POA: Diagnosis not present

## 2019-08-18 DIAGNOSIS — M25552 Pain in left hip: Secondary | ICD-10-CM | POA: Diagnosis not present

## 2019-09-20 DIAGNOSIS — R1310 Dysphagia, unspecified: Secondary | ICD-10-CM | POA: Diagnosis not present

## 2019-09-20 DIAGNOSIS — E039 Hypothyroidism, unspecified: Secondary | ICD-10-CM | POA: Diagnosis not present

## 2019-09-20 DIAGNOSIS — Z6838 Body mass index (BMI) 38.0-38.9, adult: Secondary | ICD-10-CM | POA: Diagnosis not present

## 2019-09-20 DIAGNOSIS — E7800 Pure hypercholesterolemia, unspecified: Secondary | ICD-10-CM | POA: Diagnosis not present

## 2019-09-20 DIAGNOSIS — R252 Cramp and spasm: Secondary | ICD-10-CM | POA: Diagnosis not present

## 2019-09-20 DIAGNOSIS — I10 Essential (primary) hypertension: Secondary | ICD-10-CM | POA: Diagnosis not present

## 2019-09-20 DIAGNOSIS — Z87440 Personal history of urinary (tract) infections: Secondary | ICD-10-CM | POA: Diagnosis not present

## 2019-09-22 DIAGNOSIS — M19079 Primary osteoarthritis, unspecified ankle and foot: Secondary | ICD-10-CM | POA: Diagnosis not present

## 2019-09-22 DIAGNOSIS — L84 Corns and callosities: Secondary | ICD-10-CM | POA: Diagnosis not present

## 2019-09-22 DIAGNOSIS — M67962 Unspecified disorder of synovium and tendon, left lower leg: Secondary | ICD-10-CM | POA: Diagnosis not present

## 2019-09-22 DIAGNOSIS — M79671 Pain in right foot: Secondary | ICD-10-CM | POA: Diagnosis not present

## 2019-09-22 DIAGNOSIS — M79672 Pain in left foot: Secondary | ICD-10-CM | POA: Diagnosis not present

## 2019-09-22 DIAGNOSIS — M67961 Unspecified disorder of synovium and tendon, right lower leg: Secondary | ICD-10-CM | POA: Diagnosis not present

## 2019-09-27 DIAGNOSIS — M25552 Pain in left hip: Secondary | ICD-10-CM

## 2019-09-27 HISTORY — DX: Pain in left hip: M25.552

## 2019-10-04 DIAGNOSIS — M79672 Pain in left foot: Secondary | ICD-10-CM | POA: Diagnosis not present

## 2019-10-15 DIAGNOSIS — M545 Low back pain: Secondary | ICD-10-CM | POA: Diagnosis not present

## 2019-10-15 DIAGNOSIS — N390 Urinary tract infection, site not specified: Secondary | ICD-10-CM | POA: Diagnosis not present

## 2019-10-15 DIAGNOSIS — M4316 Spondylolisthesis, lumbar region: Secondary | ICD-10-CM | POA: Diagnosis not present

## 2019-10-15 DIAGNOSIS — M25552 Pain in left hip: Secondary | ICD-10-CM | POA: Diagnosis not present

## 2019-10-20 DIAGNOSIS — Z0100 Encounter for examination of eyes and vision without abnormal findings: Secondary | ICD-10-CM | POA: Diagnosis not present

## 2019-10-20 DIAGNOSIS — H2513 Age-related nuclear cataract, bilateral: Secondary | ICD-10-CM | POA: Diagnosis not present

## 2019-11-26 DIAGNOSIS — R300 Dysuria: Secondary | ICD-10-CM | POA: Diagnosis not present

## 2019-11-26 DIAGNOSIS — N3000 Acute cystitis without hematuria: Secondary | ICD-10-CM | POA: Diagnosis not present

## 2019-11-29 DIAGNOSIS — N390 Urinary tract infection, site not specified: Secondary | ICD-10-CM | POA: Diagnosis not present

## 2019-11-29 DIAGNOSIS — N3000 Acute cystitis without hematuria: Secondary | ICD-10-CM | POA: Diagnosis not present

## 2019-11-29 DIAGNOSIS — R300 Dysuria: Secondary | ICD-10-CM | POA: Diagnosis not present

## 2019-11-29 DIAGNOSIS — R790 Abnormal level of blood mineral: Secondary | ICD-10-CM | POA: Diagnosis not present

## 2019-11-29 DIAGNOSIS — E7800 Pure hypercholesterolemia, unspecified: Secondary | ICD-10-CM | POA: Diagnosis not present

## 2019-11-29 DIAGNOSIS — Z79899 Other long term (current) drug therapy: Secondary | ICD-10-CM | POA: Diagnosis not present

## 2019-12-03 DIAGNOSIS — Z6838 Body mass index (BMI) 38.0-38.9, adult: Secondary | ICD-10-CM | POA: Diagnosis not present

## 2019-12-03 DIAGNOSIS — Z1211 Encounter for screening for malignant neoplasm of colon: Secondary | ICD-10-CM | POA: Diagnosis not present

## 2019-12-03 DIAGNOSIS — N390 Urinary tract infection, site not specified: Secondary | ICD-10-CM | POA: Diagnosis not present

## 2019-12-03 DIAGNOSIS — E7800 Pure hypercholesterolemia, unspecified: Secondary | ICD-10-CM | POA: Diagnosis not present

## 2019-12-03 DIAGNOSIS — I10 Essential (primary) hypertension: Secondary | ICD-10-CM | POA: Diagnosis not present

## 2019-12-06 DIAGNOSIS — E7800 Pure hypercholesterolemia, unspecified: Secondary | ICD-10-CM | POA: Diagnosis not present

## 2019-12-06 DIAGNOSIS — Z1211 Encounter for screening for malignant neoplasm of colon: Secondary | ICD-10-CM | POA: Diagnosis not present

## 2019-12-06 DIAGNOSIS — I10 Essential (primary) hypertension: Secondary | ICD-10-CM | POA: Diagnosis not present

## 2019-12-06 DIAGNOSIS — Z6838 Body mass index (BMI) 38.0-38.9, adult: Secondary | ICD-10-CM | POA: Diagnosis not present

## 2019-12-06 DIAGNOSIS — N390 Urinary tract infection, site not specified: Secondary | ICD-10-CM | POA: Diagnosis not present

## 2019-12-30 DIAGNOSIS — M67962 Unspecified disorder of synovium and tendon, left lower leg: Secondary | ICD-10-CM | POA: Diagnosis not present

## 2019-12-30 DIAGNOSIS — M67961 Unspecified disorder of synovium and tendon, right lower leg: Secondary | ICD-10-CM | POA: Diagnosis not present

## 2019-12-30 DIAGNOSIS — L84 Corns and callosities: Secondary | ICD-10-CM | POA: Diagnosis not present

## 2020-01-04 DIAGNOSIS — R300 Dysuria: Secondary | ICD-10-CM | POA: Diagnosis not present

## 2020-01-04 DIAGNOSIS — N3000 Acute cystitis without hematuria: Secondary | ICD-10-CM | POA: Diagnosis not present

## 2020-03-16 DIAGNOSIS — M67961 Unspecified disorder of synovium and tendon, right lower leg: Secondary | ICD-10-CM | POA: Diagnosis not present

## 2020-03-16 DIAGNOSIS — M67962 Unspecified disorder of synovium and tendon, left lower leg: Secondary | ICD-10-CM | POA: Diagnosis not present

## 2020-03-16 DIAGNOSIS — L84 Corns and callosities: Secondary | ICD-10-CM | POA: Diagnosis not present

## 2020-03-29 DIAGNOSIS — M2550 Pain in unspecified joint: Secondary | ICD-10-CM

## 2020-03-29 HISTORY — DX: Pain in unspecified joint: M25.50

## 2020-04-04 DIAGNOSIS — M13831 Other specified arthritis, right wrist: Secondary | ICD-10-CM | POA: Diagnosis not present

## 2020-04-04 DIAGNOSIS — M13832 Other specified arthritis, left wrist: Secondary | ICD-10-CM | POA: Diagnosis not present

## 2020-04-04 DIAGNOSIS — M25531 Pain in right wrist: Secondary | ICD-10-CM | POA: Diagnosis not present

## 2020-04-04 DIAGNOSIS — M25532 Pain in left wrist: Secondary | ICD-10-CM | POA: Diagnosis not present

## 2020-04-05 DIAGNOSIS — E7800 Pure hypercholesterolemia, unspecified: Secondary | ICD-10-CM | POA: Diagnosis not present

## 2020-04-05 DIAGNOSIS — R790 Abnormal level of blood mineral: Secondary | ICD-10-CM | POA: Diagnosis not present

## 2020-04-05 DIAGNOSIS — Z79899 Other long term (current) drug therapy: Secondary | ICD-10-CM | POA: Diagnosis not present

## 2020-04-05 DIAGNOSIS — R300 Dysuria: Secondary | ICD-10-CM | POA: Diagnosis not present

## 2020-04-10 ENCOUNTER — Other Ambulatory Visit: Payer: Self-pay | Admitting: Family Medicine

## 2020-04-10 DIAGNOSIS — E039 Hypothyroidism, unspecified: Secondary | ICD-10-CM | POA: Diagnosis not present

## 2020-04-10 DIAGNOSIS — N390 Urinary tract infection, site not specified: Secondary | ICD-10-CM | POA: Diagnosis not present

## 2020-04-10 DIAGNOSIS — G4733 Obstructive sleep apnea (adult) (pediatric): Secondary | ICD-10-CM | POA: Diagnosis not present

## 2020-04-10 DIAGNOSIS — Z6839 Body mass index (BMI) 39.0-39.9, adult: Secondary | ICD-10-CM | POA: Diagnosis not present

## 2020-04-10 DIAGNOSIS — E7800 Pure hypercholesterolemia, unspecified: Secondary | ICD-10-CM | POA: Diagnosis not present

## 2020-04-10 DIAGNOSIS — I10 Essential (primary) hypertension: Secondary | ICD-10-CM | POA: Diagnosis not present

## 2020-04-10 DIAGNOSIS — Z1231 Encounter for screening mammogram for malignant neoplasm of breast: Secondary | ICD-10-CM

## 2020-05-02 ENCOUNTER — Ambulatory Visit
Admission: RE | Admit: 2020-05-02 | Discharge: 2020-05-02 | Disposition: A | Discharge status: Home / Self Care | Payer: Medicare HMO | Source: Ambulatory Visit | Attending: Family Medicine | Admitting: Family Medicine

## 2020-05-02 ENCOUNTER — Other Ambulatory Visit: Payer: Self-pay

## 2020-05-02 DIAGNOSIS — Z1231 Encounter for screening mammogram for malignant neoplasm of breast: Secondary | ICD-10-CM | POA: Diagnosis not present

## 2020-05-08 DIAGNOSIS — M67962 Unspecified disorder of synovium and tendon, left lower leg: Secondary | ICD-10-CM | POA: Diagnosis not present

## 2020-05-08 DIAGNOSIS — M67961 Unspecified disorder of synovium and tendon, right lower leg: Secondary | ICD-10-CM | POA: Diagnosis not present

## 2020-05-08 DIAGNOSIS — L84 Corns and callosities: Secondary | ICD-10-CM | POA: Diagnosis not present

## 2020-05-25 DIAGNOSIS — R69 Illness, unspecified: Secondary | ICD-10-CM | POA: Diagnosis not present

## 2020-05-29 DIAGNOSIS — G4733 Obstructive sleep apnea (adult) (pediatric): Secondary | ICD-10-CM | POA: Diagnosis not present

## 2020-05-29 DIAGNOSIS — R69 Illness, unspecified: Secondary | ICD-10-CM | POA: Diagnosis not present

## 2020-06-07 DIAGNOSIS — Z6839 Body mass index (BMI) 39.0-39.9, adult: Secondary | ICD-10-CM | POA: Diagnosis not present

## 2020-06-07 DIAGNOSIS — M5136 Other intervertebral disc degeneration, lumbar region: Secondary | ICD-10-CM | POA: Diagnosis not present

## 2020-06-07 DIAGNOSIS — G629 Polyneuropathy, unspecified: Secondary | ICD-10-CM | POA: Diagnosis not present

## 2020-06-07 DIAGNOSIS — M7989 Other specified soft tissue disorders: Secondary | ICD-10-CM | POA: Diagnosis not present

## 2020-06-07 DIAGNOSIS — E669 Obesity, unspecified: Secondary | ICD-10-CM | POA: Diagnosis not present

## 2020-06-07 DIAGNOSIS — M150 Primary generalized (osteo)arthritis: Secondary | ICD-10-CM | POA: Diagnosis not present

## 2020-06-19 DIAGNOSIS — N390 Urinary tract infection, site not specified: Secondary | ICD-10-CM | POA: Diagnosis not present

## 2020-06-19 DIAGNOSIS — Z20828 Contact with and (suspected) exposure to other viral communicable diseases: Secondary | ICD-10-CM | POA: Diagnosis not present

## 2020-06-19 DIAGNOSIS — E781 Pure hyperglyceridemia: Secondary | ICD-10-CM | POA: Diagnosis not present

## 2020-06-27 DIAGNOSIS — E039 Hypothyroidism, unspecified: Secondary | ICD-10-CM | POA: Diagnosis not present

## 2020-06-27 DIAGNOSIS — R7303 Prediabetes: Secondary | ICD-10-CM | POA: Diagnosis not present

## 2020-06-27 DIAGNOSIS — R059 Cough, unspecified: Secondary | ICD-10-CM | POA: Diagnosis not present

## 2020-06-27 DIAGNOSIS — I10 Essential (primary) hypertension: Secondary | ICD-10-CM | POA: Diagnosis not present

## 2020-06-27 DIAGNOSIS — N390 Urinary tract infection, site not specified: Secondary | ICD-10-CM | POA: Diagnosis not present

## 2020-06-27 DIAGNOSIS — Z0000 Encounter for general adult medical examination without abnormal findings: Secondary | ICD-10-CM | POA: Diagnosis not present

## 2020-06-27 DIAGNOSIS — E7800 Pure hypercholesterolemia, unspecified: Secondary | ICD-10-CM | POA: Diagnosis not present

## 2020-06-27 DIAGNOSIS — G4733 Obstructive sleep apnea (adult) (pediatric): Secondary | ICD-10-CM | POA: Diagnosis not present

## 2020-06-27 DIAGNOSIS — Z03818 Encounter for observation for suspected exposure to other biological agents ruled out: Secondary | ICD-10-CM | POA: Diagnosis not present

## 2020-06-27 DIAGNOSIS — Z6839 Body mass index (BMI) 39.0-39.9, adult: Secondary | ICD-10-CM | POA: Diagnosis not present

## 2020-06-28 DIAGNOSIS — R110 Nausea: Secondary | ICD-10-CM | POA: Diagnosis not present

## 2020-06-28 DIAGNOSIS — S299XXA Unspecified injury of thorax, initial encounter: Secondary | ICD-10-CM | POA: Diagnosis not present

## 2020-07-06 ENCOUNTER — Other Ambulatory Visit: Payer: Self-pay

## 2020-07-06 ENCOUNTER — Emergency Department (HOSPITAL_COMMUNITY)
Admission: EM | Admit: 2020-07-06 | Discharge: 2020-07-06 | Disposition: A | Discharge status: Home / Self Care | Payer: Medicare HMO | Attending: Emergency Medicine | Admitting: Emergency Medicine

## 2020-07-06 ENCOUNTER — Encounter (HOSPITAL_COMMUNITY): Payer: Self-pay

## 2020-07-06 DIAGNOSIS — R7309 Other abnormal glucose: Secondary | ICD-10-CM | POA: Insufficient documentation

## 2020-07-06 DIAGNOSIS — N390 Urinary tract infection, site not specified: Secondary | ICD-10-CM | POA: Diagnosis not present

## 2020-07-06 DIAGNOSIS — Z96659 Presence of unspecified artificial knee joint: Secondary | ICD-10-CM | POA: Insufficient documentation

## 2020-07-06 DIAGNOSIS — E162 Hypoglycemia, unspecified: Secondary | ICD-10-CM

## 2020-07-06 DIAGNOSIS — Z79899 Other long term (current) drug therapy: Secondary | ICD-10-CM | POA: Diagnosis not present

## 2020-07-06 DIAGNOSIS — I10 Essential (primary) hypertension: Secondary | ICD-10-CM | POA: Insufficient documentation

## 2020-07-06 DIAGNOSIS — R42 Dizziness and giddiness: Secondary | ICD-10-CM | POA: Diagnosis present

## 2020-07-06 DIAGNOSIS — E11649 Type 2 diabetes mellitus with hypoglycemia without coma: Secondary | ICD-10-CM | POA: Diagnosis not present

## 2020-07-06 HISTORY — DX: Disorder of thyroid, unspecified: E07.9

## 2020-07-06 LAB — URINALYSIS, ROUTINE W REFLEX MICROSCOPIC
Bilirubin Urine: NEGATIVE
Glucose, UA: >=500 mg/dL — AB
Hgb urine dipstick: NEGATIVE
Ketones, ur: NEGATIVE mg/dL
Nitrite: NEGATIVE
Protein, ur: NEGATIVE mg/dL
Specific Gravity, Urine: 1.017 (ref 1.005–1.030)
WBC, UA: >50 WBC/hpf — ABNORMAL HIGH (ref 0–5)
pH: 5 (ref 5.0–8.0)

## 2020-07-06 LAB — CBG MONITORING, ED
Glucose-Capillary: 70 mg/dL (ref 70–99)
Glucose-Capillary: 97 mg/dL (ref 70–99)

## 2020-07-06 LAB — BASIC METABOLIC PANEL
Anion gap: 10 (ref 5–15)
BUN: 32 mg/dL — ABNORMAL HIGH (ref 8–23)
CO2: 24 mmol/L (ref 22–32)
Calcium: 9.9 mg/dL (ref 8.9–10.3)
Chloride: 106 mmol/L (ref 98–111)
Creatinine, Ser: 0.99 mg/dL (ref 0.44–1.00)
GFR, Estimated: >60 mL/min (ref >60)
Glucose, Bld: 61 mg/dL — ABNORMAL LOW (ref 70–99)
Potassium: 5.1 mmol/L (ref 3.5–5.1)
Sodium: 140 mmol/L (ref 135–145)

## 2020-07-06 LAB — CBC
HCT: 42.4 % (ref 36.0–46.0)
Hemoglobin: 13.3 g/dL (ref 12.0–15.0)
MCH: 28.9 pg (ref 26.0–34.0)
MCHC: 31.4 g/dL (ref 30.0–36.0)
MCV: 92 fL (ref 80.0–100.0)
Platelets: 248 10*3/uL (ref 150–400)
RBC: 4.61 MIL/uL (ref 3.87–5.11)
RDW: 13.3 % (ref 11.5–15.5)
WBC: 11.6 10*3/uL — ABNORMAL HIGH (ref 4.0–10.5)
nRBC: 0 % (ref 0.0–0.2)

## 2020-07-06 MED ORDER — CEPHALEXIN 500 MG PO CAPS: 500.0000 mg | ORAL_CAPSULE | Freq: Four times a day (QID) | ORAL | 0 refills | Status: DC

## 2020-07-06 NOTE — ED Provider Notes (Signed)
[facility] Provider Note   CSN: [id number] Arrival date & time: 07/06/20  1054     History Chief Complaint  Patient presents with  . Dizziness  . Near Syncope    Elizabeth Roman is a 69 y.o. female.  69 year old female presents here after having near syncopal event.  Denies any recent illness.  States that she was shopping and then became dizzy and lightheaded.  Denied any headache, chest pain, shortness of breath.  Sat down and is unsure if she did pass out.  States that she has not had any medication changes.  Feels back to herself otherwise.        Past Medical History:  Diagnosis Date  . Arthritis   . High cholesterol   . Hypertension   . Sleep apnea   . Thyroid disease     There are no problems to display for this patient.   Past Surgical History:  Procedure Laterality Date  . ABDOMINAL HYSTERECTOMY    . BREAST BIOPSY    . BUNIONECTOMY    . CARPAL TUNNEL RELEASE Left   . HAND SURGERY Right   . MEDIAL PARTIAL KNEE REPLACEMENT       OB History   No obstetric history on file.     Family History  Problem Relation Age of Onset  . Breast cancer Mother   . Breast cancer Daughter     Social History   Tobacco Use  . Smoking status: Never Smoker  . Smokeless tobacco: Never Used  Vaping Use  . Vaping Use: Never used  Substance Use Topics  . Alcohol use: Yes    Comment: occ  . Drug use: No    Home Medications Prior to Admission medications   Medication Sig Start Date End Date Taking? Authorizing Provider  acetaminophen (TYLENOL) 650 MG CR tablet Take 1,300 mg by mouth 2 (two) times daily.    [provider]  cephALEXin (KEFLEX) 500 MG capsule Take 1 capsule (500 mg total) by mouth 4 (four) times daily. 09/24/18   Derwood Kaplan, MD  Cholecalciferol (VITAMIN D PO) Take 8,000 Units by mouth at bedtime.    [provider]  Coenzyme Q10 (CO Q-10) 200 MG CAPS Take 400 mg by mouth at bedtime.    [provider]  levothyroxine (SYNTHROID, LEVOTHROID) 75 MCG tablet Take 75 mcg by mouth daily before breakfast.    [provider]  lisinopril (PRINIVIL,ZESTRIL) 10 MG tablet Take 10 mg by mouth daily.    [provider]  MAGNESIUM PO Take 400 mg by mouth daily.    [provider]  Melatonin 10 MG TABS Take 1 tablet by mouth at bedtime.    [provider]  naproxen (NAPROSYN) 375 MG tablet Take 1 tablet (375 mg total) by mouth 2 (two) times daily. 09/24/18   Derwood Kaplan, MD  Omega-3 Krill Oil 500 MG CAPS Take 1,500 mg by mouth at bedtime.    [provider]  pantoprazole (PROTONIX) 40 MG tablet Take 40 mg by mouth daily as needed (indigestion).    [provider]  pravastatin (PRAVACHOL) 10 MG tablet Take 10 mg by mouth daily. 09/17/18   [provider]  traMADol (ULTRAM) 50 MG tablet Take 1 tablet (50 mg total) by mouth every 6 (six) hours as needed. Patient not taking: Reported on 09/24/2018 09/14/14   Azalia Bilis, MD  Turmeric (CURCUMIN 95 PO) Take 1 tablet by mouth 2 (two) times daily.  [provider]  VITAMIN B COMPLEX-C CAPS Take 1 capsule by mouth 2 (two) times daily.    [provider]  vitamin C (ASCORBIC ACID) 500 MG tablet Take 500 mg by mouth 2 (two) times daily.    [provider]    Allergies    Morphine and related, Codeine, Ivp dye [iodinated diagnostic agents], Phenobarbital, Sulfa antibiotics, and Vicodin [hydrocodone-acetaminophen]  Review of Systems   Review of Systems  All other systems reviewed and are negative.   Physical Exam Updated Vital Signs BP 113/68 (BP Location: Left Arm)   Pulse 67   Resp 16   Ht 1.6 m (5\' 3" )   Wt 98 kg   SpO2 97%   BMI 38.26 kg/m   Physical Exam Vitals and nursing note reviewed.  Constitutional:      General: She is not in acute distress.    Appearance: Normal appearance. She is well-developed and well-nourished. She is not  toxic-appearing.  HENT:     Head: Normocephalic and atraumatic.  Eyes:     General: Lids are normal.     Extraocular Movements: EOM normal.     Conjunctiva/sclera: Conjunctivae normal.     Pupils: Pupils are equal, round, and reactive to light.  Neck:     Thyroid: No thyroid mass.     Trachea: No tracheal deviation.  Cardiovascular:     Rate and Rhythm: Normal rate and regular rhythm.     Heart sounds: Normal heart sounds. No murmur heard. No gallop.   Pulmonary:     Effort: Pulmonary effort is normal. No respiratory distress.     Breath sounds: Normal breath sounds. No stridor. No decreased breath sounds, wheezing, rhonchi or rales.  Abdominal:     General: Bowel sounds are normal. There is no distension.     Palpations: Abdomen is soft.     Tenderness: There is no abdominal tenderness. There is no CVA tenderness or rebound.  Musculoskeletal:        General: No tenderness or edema. Normal range of motion.     Cervical back: Normal range of motion and neck supple.  Skin:    General: Skin is warm and dry.     Findings: No abrasion or rash.  Neurological:     General: No focal deficit present.     Mental Status: She is alert and oriented to person, place, and time.     GCS: GCS eye subscore is 4. GCS verbal subscore is 5. GCS motor subscore is 6.     Cranial Nerves: Cranial nerves are intact. No cranial nerve deficit.     Sensory: No sensory deficit.     Motor: Motor function is intact.     Coordination: Coordination is intact.     Deep Tendon Reflexes: Strength normal.  Psychiatric:        Mood and Affect: Mood and affect normal.        Speech: Speech normal.        Behavior: Behavior normal.     ED Results / Procedures / Treatments   Labs (all labs ordered are listed, but only abnormal results are displayed) Labs Reviewed  BASIC METABOLIC PANEL - Abnormal; Notable for the following components:      Result Value   Glucose, Bld 61 (*)    BUN 32 (*)    All other  components within normal limits  CBC - Abnormal; Notable for the following components:   WBC 11.6 (*)    All  other components within normal limits  URINALYSIS, ROUTINE W REFLEX MICROSCOPIC  CBG MONITORING, ED    EKG EKG Interpretation  Date/Time:  Thursday July 06 2020 11:13:31 EST Ventricular Rate:  66 PR Interval:    QRS Duration: 92 QT Interval:  403 QTC Calculation: 423 R Axis:   62 Text Interpretation: Sinus rhythm Low voltage, precordial leads Baseline wander in lead(s) V2 12 Lead; Mason-Likar No old tracing to compare Confirmed by Lorre Nick ([id number]) on 07/06/2020 12:42:39 PM   Radiology No results found.  Procedures Procedures (including critical care time)  Medications Ordered in ED Medications - No data to display  ED Course  I have reviewed the triage vital signs and the nursing notes.  Pertinent labs & imaging results that were available during my care of the patient were reviewed by me and considered in my medical decision making (see chart for details).    MDM Rules/Calculators/A&P                          Patient blood sugar noted here and treated with a meal and it has improved and she feels better.  Urinalysis positive for infection and she just recently completed a course of ciprofloxacin.  Will start on Keflex and send urine culture.  She was now orthostatic.  Will discharge home Final Clinical Impression(s) / ED Diagnoses Final diagnoses:  None    Rx / DC Orders ED Discharge Orders    None       Lorre Nick, MD 07/06/20 1440

## 2020-07-06 NOTE — ED Triage Notes (Signed)
Patient states she became dizzy and diaphoretic while walking in a store today. Patient states she felt like she was "going to pass out."

## 2020-07-09 LAB — URINE CULTURE: Culture: >=100000 — AB

## 2020-07-10 ENCOUNTER — Telehealth: Payer: Self-pay | Admitting: Emergency Medicine

## 2020-07-10 NOTE — Telephone Encounter (Signed)
Post ED Visit - Positive Culture Follow-up  Culture report reviewed by antimicrobial stewardship pharmacist: [facility] []  , Pharm.D. []  Enzo Bi, Pharm.D., BCPS AQ-ID []  , Pharm.D., BCPS []  Celedonio Miyamoto, Pharm.D., BCPS []  [place], Garvin Fila.D., BCPS, AAHIVP []  , Pharm.D., BCPS, AAHIVP []  Georgina Pillion, PharmD, BCPS []  , PharmD, BCPS []  [place], PharmD, BCPS []  [address], PharmD []  , PharmD, BCPS []  Estella Husk, PharmD  Pharmacy Team [x]  Lysle Pearl, PharmD []  , PharmD []  Phillips Climes, PharmD []  , Rph []  Agapito Games) , PharmD []  Verlan Friends, PharmD []  , PharmD []  Mervyn Gay, PharmD []  , PharmD []  Vinnie Level, PharmD []  [facility], PharmD []  , PharmD []  Len Childs, PharmD   Positive urine culture Treated with cephalexin, organism sensitive to the same and no further patient follow-up is required at this time.  07/10/2020, 11:28 AM

## 2020-07-12 DIAGNOSIS — M5136 Other intervertebral disc degeneration, lumbar region: Secondary | ICD-10-CM | POA: Diagnosis not present

## 2020-07-12 DIAGNOSIS — G629 Polyneuropathy, unspecified: Secondary | ICD-10-CM | POA: Diagnosis not present

## 2020-07-12 DIAGNOSIS — M0600 Rheumatoid arthritis without rheumatoid factor, unspecified site: Secondary | ICD-10-CM | POA: Diagnosis not present

## 2020-07-12 DIAGNOSIS — M7989 Other specified soft tissue disorders: Secondary | ICD-10-CM | POA: Diagnosis not present

## 2020-07-12 DIAGNOSIS — M150 Primary generalized (osteo)arthritis: Secondary | ICD-10-CM | POA: Diagnosis not present

## 2020-07-12 DIAGNOSIS — Z6839 Body mass index (BMI) 39.0-39.9, adult: Secondary | ICD-10-CM | POA: Diagnosis not present

## 2020-07-12 DIAGNOSIS — E669 Obesity, unspecified: Secondary | ICD-10-CM | POA: Diagnosis not present

## 2020-07-14 DIAGNOSIS — L84 Corns and callosities: Secondary | ICD-10-CM | POA: Diagnosis not present

## 2020-07-14 DIAGNOSIS — M67962 Unspecified disorder of synovium and tendon, left lower leg: Secondary | ICD-10-CM | POA: Diagnosis not present

## 2020-07-14 DIAGNOSIS — M67961 Unspecified disorder of synovium and tendon, right lower leg: Secondary | ICD-10-CM | POA: Diagnosis not present

## 2020-08-02 DIAGNOSIS — G4733 Obstructive sleep apnea (adult) (pediatric): Secondary | ICD-10-CM | POA: Diagnosis not present

## 2020-08-25 DIAGNOSIS — G4733 Obstructive sleep apnea (adult) (pediatric): Secondary | ICD-10-CM | POA: Diagnosis not present

## 2020-09-06 DIAGNOSIS — M67962 Unspecified disorder of synovium and tendon, left lower leg: Secondary | ICD-10-CM | POA: Diagnosis not present

## 2020-09-06 DIAGNOSIS — L84 Corns and callosities: Secondary | ICD-10-CM | POA: Diagnosis not present

## 2020-09-06 DIAGNOSIS — M67961 Unspecified disorder of synovium and tendon, right lower leg: Secondary | ICD-10-CM | POA: Diagnosis not present

## 2020-09-06 DIAGNOSIS — I10 Essential (primary) hypertension: Secondary | ICD-10-CM | POA: Diagnosis not present

## 2020-09-06 DIAGNOSIS — E7800 Pure hypercholesterolemia, unspecified: Secondary | ICD-10-CM | POA: Diagnosis not present

## 2020-09-11 DIAGNOSIS — I129 Hypertensive chronic kidney disease with stage 1 through stage 4 chronic kidney disease, or unspecified chronic kidney disease: Secondary | ICD-10-CM | POA: Diagnosis not present

## 2020-09-11 DIAGNOSIS — N1831 Chronic kidney disease, stage 3a: Secondary | ICD-10-CM | POA: Diagnosis not present

## 2020-09-11 DIAGNOSIS — R42 Dizziness and giddiness: Secondary | ICD-10-CM | POA: Diagnosis not present

## 2020-09-11 DIAGNOSIS — E7800 Pure hypercholesterolemia, unspecified: Secondary | ICD-10-CM | POA: Diagnosis not present

## 2020-09-11 DIAGNOSIS — E039 Hypothyroidism, unspecified: Secondary | ICD-10-CM | POA: Diagnosis not present

## 2020-09-11 DIAGNOSIS — M8580 Other specified disorders of bone density and structure, unspecified site: Secondary | ICD-10-CM | POA: Diagnosis not present

## 2020-09-17 NOTE — Progress Notes (Signed)
Cardiology Office Note:    Date:  09/19/2020   ID:  Elizabeth Roman, DOB May 22, 1951, MRN [id number]  PCP:  Sigmund Hazel, MD   [facility]  Cardiologist:  No primary care provider on file.  Advanced Practice Provider:  No care team member to display Electrophysiologist:  None    Referring MD: Sigmund Hazel, MD    History of Present Illness:    Elizabeth Roman is a 70 y.o. female with a hx of HTN, HLD and OSA who was referred by Dr. Hyacinth Meeker for further evaluation of lightheadedness and palpitations.  Was seen in the ED 07/06/20 for episode of lightheadedness and pre-syncope while shopping. UA there positive and patient had UTI and patient recently completed course of ABX. She was started on keflex in the ED. BG was also low at 61 which improved with eating. ECG with NSR, low voltage, no ischemia. She clinically improved and was sent home.   Since her visit to the ER, she has continued to have episodes of dizziness daily that lasts a couple of seconds and then resolves (episode on 07/06/20 lasted 45 minutes). No associated chest pain, palpitations, SOB. No syncope. Symptoms can occur at any time, but always occur when standing. Not positional related (not when going from seated to standing). Not exertional, but activity limited due to arthritis. Has been ongoing for a year but frequency has increased. No known personal history of heart disease. No new medications. No smoking. Has been monitoring blood pressures and while they fluctuate, no readings of hypotension (BP<100) or severe HTN (SBP>269mmHg). She is pre-diabetic with A1C 6.0, but no known diabetes.  Admits to not hydrating as well as she should and she does not eat much in the morning.  Family history: Father with CAD (died of MI at 73), paternal uncles and aunts with CAD.    Past Medical History:  Diagnosis Date  . Arthritis   . High cholesterol   . Hypertension   . Left hip pain 09/2019  . Multiple joint pain  03/2020   HANDICAP PLACARD   . Sleep apnea   . Thyroid disease     Past Surgical History:  Procedure Laterality Date  . ABDOMINAL HYSTERECTOMY    . BREAST BIOPSY    . BUNIONECTOMY    . CARPAL TUNNEL RELEASE Left   . HAND SURGERY Right   . MEDIAL PARTIAL KNEE REPLACEMENT    . TONSILLECTOMY      Current Medications: Current Meds  Medication Sig  . Bromelain 250 MG CAPS Take by mouth daily.  . cephALEXin (KEFLEX) 500 MG capsule Take 1 capsule (500 mg total) by mouth 4 (four) times daily.  . Coenzyme Q10 (CO Q-10) 200 MG CAPS Take 400 mg by mouth at bedtime.  Marland Kitchen estradiol (ESTRACE) 0.1 MG/GM vaginal cream 2 (two) times a week.  . ferrous sulfate 325 (65 FE) MG tablet daily.  . hydroxychloroquine (PLAQUENIL) 200 MG tablet Take 200 mg by mouth 2 (two) times daily.  Marland Kitchen levothyroxine (SYNTHROID, LEVOTHROID) 75 MCG tablet Take 75 mcg by mouth daily before breakfast.  . lisinopril (PRINIVIL,ZESTRIL) 10 MG tablet Take 10 mg by mouth daily.  Marland Kitchen MAGNESIUM PO Take 400 mg by mouth daily.  . naproxen (NAPROSYN) 375 MG tablet Take 1 tablet (375 mg total) by mouth 2 (two) times daily.  . nitrofurantoin, macrocrystal-monohydrate, (MACROBID) 100 MG capsule Take 100 mg by mouth in the morning and at bedtime.  . pantoprazole (PROTONIX) 40 MG tablet Take 40  mg by mouth daily as needed (indigestion).  . pravastatin (PRAVACHOL) 10 MG tablet Take 10 mg by mouth daily.  . Turmeric (CURCUMIN 95 PO) Take 1 tablet by mouth 2 (two) times daily.  Marland Kitchen VITAMIN B COMPLEX-C CAPS Take 1 capsule by mouth 2 (two) times daily.  . vitamin C (ASCORBIC ACID) 500 MG tablet Take 500 mg by mouth 2 (two) times daily.  Marland Kitchen zinc gluconate 50 MG tablet Take 50 mg by mouth daily.     Allergies:   Morphine and related, Prednisone, Atorvastatin, Codeine, Ivp dye [iodinated diagnostic agents], Phenobarbital, Propoxyphene, Sulfa antibiotics, and Vicodin [hydrocodone-acetaminophen]   Social History   Socioeconomic History  . Marital  status: Married    Spouse name: Not on file  . Number of children: 8  . Years of education: Not on file  . Highest education level: Not on file  Occupational History  . Occupation: RETIRED HAIR DRESSER  Tobacco Use  . Smoking status: Never Smoker  . Smokeless tobacco: Never Used  Vaping Use  . Vaping Use: Never used  Substance and Sexual Activity  . Alcohol use: Yes    Comment: occ  . Drug use: No  . Sexual activity: Not on file  Other Topics Concern  . Not on file  Social History Narrative  . Not on file   Social Determinants of Health   Financial Resource Strain: Not on file  Food Insecurity: Not on file  Transportation Needs: Not on file  Physical Activity: Not on file  Stress: Not on file  Social Connections: Not on file     Family History: The patient's family history includes Breast cancer in her daughter and mother.  ROS:   Please see the history of present illness.    Review of Systems  Constitutional: Negative for chills and fever.  HENT: Negative for hearing loss and sore throat.   Eyes: Negative for blurred vision and redness.  Respiratory: Negative for shortness of breath.   Cardiovascular: Negative for chest pain, palpitations, orthopnea, claudication, leg swelling and PND.  Gastrointestinal: Negative for nausea and vomiting.  Genitourinary: Negative for frequency.  Musculoskeletal: Positive for joint pain. Negative for falls.  Neurological: Positive for dizziness. Negative for loss of consciousness.  Endo/Heme/Allergies: Positive for environmental allergies.  Psychiatric/Behavioral: Negative for substance abuse.    EKGs/Labs/Other Studies Reviewed:    The following studies were reviewed today: No cardiac studies  EKG:  EKG is  ordered today.  The ekg ordered today demonstrates NSR with HR 89  Recent Labs: 07/06/2020: BUN 32; Creatinine, Ser 0.99; Hemoglobin 13.3; Platelets 248; Potassium 5.1; Sodium 140  Recent Lipid Panel No results found  for: CHOL, TRIG, HDL, CHOLHDL, VLDL, LDLCALC, LDLDIRECT    Physical Exam:    VS:  BP 138/88   Pulse 89   Ht  (1.6 m)   Wt 214 lb (97.1 kg)   SpO2 100%   BMI 37.91 kg/m     Wt Readings from Last 3 Encounters:  09/19/20 214 lb (97.1 kg)  07/06/20 216 lb (98 kg)     GEN:  Well nourished, well developed in no acute distress HEENT: Normal NECK: No JVD; No carotid bruits CARDIAC: RRR, no murmurs, rubs, gallops RESPIRATORY:  Clear to auscultation without rales, wheezing or rhonchi  ABDOMEN: Soft, non-tender, non-distended MUSCULOSKELETAL:  No edema; No deformity  SKIN: Warm and dry NEUROLOGIC:  Alert and oriented x 3 PSYCHIATRIC:  Normal affect   ASSESSMENT:    1. Dizziness  2. Palpitations   3. Primary hypertension   4. Mixed hyperlipidemia    PLAN:    In order of problems listed above:  #Lightheadedness: Patient with episodes of dizziness over the past year that have worsened over the past several months. Specifically, episodes come on suddenly where she feels very lightheaded and like she may pass out before resolving. Symptoms are not related to position changes but always occur when she is standing. No associated palpitations, chest pain, SOB, orthopnea. No syncope. No new medications. Per report, no episodes of hypotension or severe hypertension at home Had one episode when she went to the ED where it lasted for . She was hypoglycemic and had a UTI at the time. Unclear etiology of lightheadedness but may be related to orthostasis given symptoms always occur with standing and poor fluid intake. Want to rule-out cardiac etiology as well, however.  -Check 7 day zio montior -Plan for TTE once patient returns from [place] in April -Compression socks/abdominal binders -Discussed adequate hydration a length -Elevate legs when seated; slow position changes -Eat regular meals and ensure she has something with salt with her if she is out for a long period of time in  case the dizziness comes on -Patient will monitor her blood pressure when she has her dizzy spells and see if they correlate with low/high blood pressure -If symptoms worsen while in [place], told her she needs to be seen by a physician there for further work-up  #HTN: -Continue lisinopril  daily -Patient is going to log blood pressures for [place] to review  #HLD: Managed by PCP. -Continue pravastatin  daily   Medication Adjustments/Labs and Tests Ordered: Current medicines are reviewed at length with the patient today.  Concerns regarding medicines are outlined above.  Orders Placed This Encounter  Procedures  . LONG TERM MONITOR (3-14 DAYS)  . EKG 12-Lead  . ECHOCARDIOGRAM COMPLETE   No orders of the defined types were placed in this encounter.   Patient Instructions   Medication Instructions:  Your physician recommends that you continue on your current medications as directed. Please refer to the Current Medication list given to you today.  *If you need a refill on your cardiac medications before your next appointment, please call your pharmacy*   Lab Work: None ordered  If you have labs (blood work) drawn today and your tests are completely normal, you will receive your results only by: Marland Kitchen MyChart Message (if you have MyChart) OR . A paper copy in the mail If you have any lab test that is abnormal or we need to change your treatment, we will call you to review the results.   Testing/Procedures: Your physician has requested that you have an echocardiogram. Echocardiography is a painless test that uses sound waves to create images of your heart. It provides your doctor with information about the size and shape of your heart and how well your heart's chambers and valves are working. This procedure takes approximately one hour. There are no restrictions for this procedure.    ZIO XT- Long Term Monitor Instructions   Your physician has requested you wear your ZIO  patch monitor 7 days.   This is a single patch monitor.  [employer] supplies one patch monitor per enrollment.  Additional stickers are not available.   Please do not apply patch if you will be having a Nuclear Stress Test, Echocardiogram, Cardiac CT, MRI, or Chest Xray during the time frame you would be wearing the monitor. The patch cannot  be worn during these tests.  You cannot remove and re-apply the ZIO XT patch monitor.   Your ZIO patch monitor will be sent USPS Priority mail from [facility] directly to your home address. The monitor may also be mailed to a PO BOX if home delivery is not available.   It may take 3-5 days to receive your monitor after you have been enrolled.   Once you have received you monitor, please review enclosed instructions.  Your monitor has already been registered assigning a specific monitor serial # to you.   Applying the monitor   Shave hair from upper left chest.   Hold abrader disc by orange tab.  Rub abrader in 40 strokes over left upper chest as indicated in your monitor instructions.   Clean area with 4 enclosed alcohol pads .  Use all pads to assure are is cleaned thoroughly.  Let dry.   Apply patch as indicated in monitor instructions.  Patch will be place under collarbone on left side of chest with arrow pointing upward.   Rub patch adhesive wings for 2 minutes.Remove white label marked "1".  Remove white label marked "2".  Rub patch adhesive wings for 2 additional minutes.   While looking in a mirror, press and release button in center of patch.  A small green light will flash 3-4 times .  This will be your only indicator the monitor has been turned on.     Do not shower for the first 24 hours.  You may shower after the first 24 hours.   Press button if you feel a symptom. You will hear a small click.  Record Date, Time and Symptom in the Patient Log Book.   When you are ready to remove patch, follow instructions on last 2 pages of Patient  Log Book.  Stick patch monitor onto last page of Patient Log Book.   Place Patient Log Book in [place] box.  Use locking tab on box and tape box closed securely.  The [employer] has [employer] on it.  Please place in mailbox as soon as possible.  Your physician should have your test results approximately 7 days after the monitor has been mailed back to [facility].   Call [facility] Customer Care at [phone] if you have questions regarding your ZIO XT patch monitor.  Call them immediately if you see an orange light blinking on your monitor.   If your monitor falls off in less than 4 days contact our Monitor department at [phone].  If your monitor becomes loose or falls off after 4 days call [employer] at [phone] for suggestions on securing your monitor.      Follow-Up: At [facility], you and your health needs are our priority.  As part of our continuing mission to provide you with exceptional heart care, we have created designated Provider Care Teams.  These Care Teams include your primary Cardiologist (physician) and Advanced Practice Providers (APPs -  Physician Assistants and Nurse Practitioners) who all work together to provide you with the care you need, when you need it.  We recommend signing up for the patient portal called "MyChart".  Sign up information is provided on this After Visit Summary.  MyChart is used to connect with patients for Virtual Visits (Telemedicine).  Patients are able to view lab/test results, encounter notes, upcoming appointments, etc.  Non-urgent messages can be sent to your provider as well.   To learn more about what you can do  with MyChart, go to [email or web address].    Your next appointment:   8 week(s)  The format for your next appointment:   In Person  Provider:   Laurance Flatten, MD   Other Instructions  Echocardiogram An echocardiogram is a test that uses sound waves (ultrasound) to produce images of the  heart. Images from an echocardiogram can provide important information about:  Heart size and shape.  The size and thickness and movement of your heart's walls.  Heart muscle function and strength.  Heart valve function or if you have stenosis. Stenosis is when the heart valves are too narrow.  If blood is flowing backward through the heart valves (regurgitation).  A tumor or infectious growth around the heart valves.  Areas of heart muscle that are not working well because of poor blood flow or injury from a heart attack.  Aneurysm detection. An aneurysm is a weak or damaged part of an artery wall. The wall bulges out from the normal force of blood pumping through the body. Tell a health care provider about:  Any allergies you have.  All medicines you are taking, including vitamins, herbs, eye drops, creams, and over-the-counter medicines.  Any blood disorders you have.  Any surgeries you have had.  Any medical conditions you have.  Whether you are pregnant or may be pregnant. What are the risks? Generally, this is a safe test. However, problems may occur, including an allergic reaction to dye (contrast) that may be used during the test. What happens before the test? No specific preparation is needed. You may eat and drink normally. What happens during the test?  You will take off your clothes from the waist up and put on a hospital gown.  Electrodes or electrocardiogram (ECG)patches may be placed on your chest. The electrodes or patches are then connected to a device that monitors your heart rate and rhythm.  You will lie down on a table for an ultrasound exam. A gel will be applied to your chest to help sound waves pass through your skin.  A handheld device, called a transducer, will be pressed against your chest and moved over your heart. The transducer produces sound waves that travel to your heart and bounce back (or "echo" back) to the transducer. These sound waves  will be captured in real-time and changed into images of your heart that can be viewed on a video monitor. The images will be recorded on a computer and reviewed by your health care provider.  You may be asked to change positions or hold your breath for a short time. This makes it easier to get different views or better views of your heart.  In some cases, you may receive contrast through an IV in one of your veins. This can improve the quality of the pictures from your heart. The procedure may vary among health care providers and hospitals.   What can I expect after the test? You may return to your normal, everyday life, including diet, activities, and medicines, unless your health care provider tells you not to do that. Follow these instructions at home:  It is up to you to get the results of your test. Ask your health care provider, or the department that is doing the test, when your results will be ready.  Keep all follow-up visits. This is important. Summary  An echocardiogram is a test that uses sound waves (ultrasound) to produce images of the heart.  Images from an echocardiogram can provide important information about  the size and shape of your heart, heart muscle function, heart valve function, and other possible heart problems.  You do not need to do anything to prepare before this test. You may eat and drink normally.  After the echocardiogram is completed, you may return to your normal, everyday life, unless your health care provider tells you not to do that. This information is not intended to replace advice given to you by your health care provider. Make sure you discuss any questions you have with your health care provider. Document Revised: 03/07/2020 Document Reviewed: 03/07/2020 Elsevier Patient Education  2021 [employer]vier [employer].      Signed, Meriam Sprague, MD  09/19/2020 12:04 PM    [facility]

## 2020-09-19 ENCOUNTER — Other Ambulatory Visit: Payer: Self-pay

## 2020-09-19 ENCOUNTER — Encounter: Payer: Self-pay | Admitting: *Deleted

## 2020-09-19 ENCOUNTER — Ambulatory Visit (INDEPENDENT_AMBULATORY_CARE_PROVIDER_SITE_OTHER): Payer: Medicare HMO

## 2020-09-19 ENCOUNTER — Encounter: Payer: Self-pay | Admitting: Cardiology

## 2020-09-19 ENCOUNTER — Ambulatory Visit: Payer: Medicare HMO | Admitting: Cardiology

## 2020-09-19 VITALS — BP 138/88 | HR 89 | Ht 63.0 in | Wt 214.0 lb

## 2020-09-19 DIAGNOSIS — E782 Mixed hyperlipidemia: Secondary | ICD-10-CM

## 2020-09-19 DIAGNOSIS — I10 Essential (primary) hypertension: Secondary | ICD-10-CM | POA: Diagnosis not present

## 2020-09-19 DIAGNOSIS — R002 Palpitations: Secondary | ICD-10-CM

## 2020-09-19 DIAGNOSIS — R42 Dizziness and giddiness: Secondary | ICD-10-CM

## 2020-09-19 NOTE — Patient Instructions (Addendum)
Medication Instructions:  Your physician recommends that you continue on your current medications as directed. Please refer to the Current Medication list given to you today.  *If you need a refill on your cardiac medications before your next appointment, please call your pharmacy*   Lab Work: None ordered  If you have labs (blood work) drawn today and your tests are completely normal, you will receive your results only by: Marland Kitchen MyChart Message (if you have MyChart) OR . A paper copy in the mail If you have any lab test that is abnormal or we need to change your treatment, we will call you to review the results.   Testing/Procedures: Your physician has requested that you have an echocardiogram. Echocardiography is a painless test that uses sound waves to create images of your heart. It provides your doctor with information about the size and shape of your heart and how well your heart's chambers and valves are working. This procedure takes approximately one hour. There are no restrictions for this procedure.    ZIO XT- Long Term Monitor Instructions   Your physician has requested you wear your ZIO patch monitor 7 days.   This is a single patch monitor.  [employer] supplies one patch monitor per enrollment.  Additional stickers are not available.   Please do not apply patch if you will be having a Nuclear Stress Test, Echocardiogram, Cardiac CT, MRI, or Chest Xray during the time frame you would be wearing the monitor. The patch cannot be worn during these tests.  You cannot remove and re-apply the ZIO XT patch monitor.   Your ZIO patch monitor will be sent USPS Priority mail from [facility] directly to your home address. The monitor may also be mailed to a PO BOX if home delivery is not available.   It may take 3-5 days to receive your monitor after you have been enrolled.   Once you have received you monitor, please review enclosed instructions.  Your monitor has already been  registered assigning a specific monitor serial # to you.   Applying the monitor   Shave hair from upper left chest.   Hold abrader disc by orange tab.  Rub abrader in 40 strokes over left upper chest as indicated in your monitor instructions.   Clean area with 4 enclosed alcohol pads .  Use all pads to assure are is cleaned thoroughly.  Let dry.   Apply patch as indicated in monitor instructions.  Patch will be place under collarbone on left side of chest with arrow pointing upward.   Rub patch adhesive wings for 2 minutes.Remove white label marked "1".  Remove white label marked "2".  Rub patch adhesive wings for 2 additional minutes.   While looking in a mirror, press and release button in center of patch.  A small green light will flash 3-4 times .  This will be your only indicator the monitor has been turned on.     Do not shower for the first 24 hours.  You may shower after the first 24 hours.   Press button if you feel a symptom. You will hear a small click.  Record Date, Time and Symptom in the Patient Log Book.   When you are ready to remove patch, follow instructions on last 2 pages of Patient Log Book.  Stick patch monitor onto last page of Patient Log Book.   Place Patient Log Book in [place] box.  Use locking tab on box and tape box closed securely.  The [employer] has [employer] on it.  Please place in mailbox as soon as possible.  Your physician should have your test results approximately 7 days after the monitor has been mailed back to [facility].   Call [facility] Customer Care at [phone] if you have questions regarding your ZIO XT patch monitor.  Call them immediately if you see an orange light blinking on your monitor.   If your monitor falls off in less than 4 days contact our Monitor department at [phone].  If your monitor becomes loose or falls off after 4 days call [employer] at [phone] for suggestions on securing your monitor.       Follow-Up: At [facility], you and your health needs are our priority.  As part of our continuing mission to provide you with exceptional heart care, we have created designated Provider Care Teams.  These Care Teams include your primary Cardiologist (physician) and Advanced Practice Providers (APPs -  Physician Assistants and Nurse Practitioners) who all work together to provide you with the care you need, when you need it.  We recommend signing up for the patient portal called "MyChart".  Sign up information is provided on this After Visit Summary.  MyChart is used to connect with patients for Virtual Visits (Telemedicine).  Patients are able to view lab/test results, encounter notes, upcoming appointments, etc.  Non-urgent messages can be sent to your provider as well.   To learn more about what you can do with MyChart, go to [email or web address].    Your next appointment:   8 week(s)  The format for your next appointment:   In Person  Provider:   Laurance Flatten, MD   Other Instructions  Echocardiogram An echocardiogram is a test that uses sound waves (ultrasound) to produce images of the heart. Images from an echocardiogram can provide important information about:  Heart size and shape.  The size and thickness and movement of your heart's walls.  Heart muscle function and strength.  Heart valve function or if you have stenosis. Stenosis is when the heart valves are too narrow.  If blood is flowing backward through the heart valves (regurgitation).  A tumor or infectious growth around the heart valves.  Areas of heart muscle that are not working well because of poor blood flow or injury from a heart attack.  Aneurysm detection. An aneurysm is a weak or damaged part of an artery wall. The wall bulges out from the normal force of blood pumping through the body. Tell a health care provider about:  Any allergies you have.  All medicines you are taking,  including vitamins, herbs, eye drops, creams, and over-the-counter medicines.  Any blood disorders you have.  Any surgeries you have had.  Any medical conditions you have.  Whether you are pregnant or may be pregnant. What are the risks? Generally, this is a safe test. However, problems may occur, including an allergic reaction to dye (contrast) that may be used during the test. What happens before the test? No specific preparation is needed. You may eat and drink normally. What happens during the test?  You will take off your clothes from the waist up and put on a hospital gown.  Electrodes or electrocardiogram (ECG)patches may be placed on your chest. The electrodes or patches are then connected to a device that monitors your heart rate and rhythm.  You will lie down on a table for an ultrasound exam. A gel will be applied to your chest to  help sound waves pass through your skin.  A handheld device, called a transducer, will be pressed against your chest and moved over your heart. The transducer produces sound waves that travel to your heart and bounce back (or "echo" back) to the transducer. These sound waves will be captured in real-time and changed into images of your heart that can be viewed on a video monitor. The images will be recorded on a computer and reviewed by your health care provider.  You may be asked to change positions or hold your breath for a short time. This makes it easier to get different views or better views of your heart.  In some cases, you may receive contrast through an IV in one of your veins. This can improve the quality of the pictures from your heart. The procedure may vary among health care providers and hospitals.   What can I expect after the test? You may return to your normal, everyday life, including diet, activities, and medicines, unless your health care provider tells you not to do that. Follow these instructions at home:  It is up to you to  get the results of your test. Ask your health care provider, or the department that is doing the test, when your results will be ready.  Keep all follow-up visits. This is important. Summary  An echocardiogram is a test that uses sound waves (ultrasound) to produce images of the heart.  Images from an echocardiogram can provide important information about the size and shape of your heart, heart muscle function, heart valve function, and other possible heart problems.  You do not need to do anything to prepare before this test. You may eat and drink normally.  After the echocardiogram is completed, you may return to your normal, everyday life, unless your health care provider tells you not to do that. This information is not intended to replace advice given to you by your health care provider. Make sure you discuss any questions you have with your health care provider. Document Revised: 03/07/2020 Document Reviewed: 03/07/2020 Elsevier Patient Education  2021 [employer].

## 2020-09-23 DIAGNOSIS — Z20822 Contact with and (suspected) exposure to covid-19: Secondary | ICD-10-CM | POA: Diagnosis not present

## 2020-09-23 DIAGNOSIS — M545 Low back pain, unspecified: Secondary | ICD-10-CM | POA: Diagnosis not present

## 2020-09-23 DIAGNOSIS — N3001 Acute cystitis with hematuria: Secondary | ICD-10-CM | POA: Diagnosis not present

## 2020-09-23 DIAGNOSIS — R5383 Other fatigue: Secondary | ICD-10-CM | POA: Diagnosis not present

## 2020-09-23 DIAGNOSIS — R350 Frequency of micturition: Secondary | ICD-10-CM | POA: Diagnosis not present

## 2020-09-23 DIAGNOSIS — R300 Dysuria: Secondary | ICD-10-CM | POA: Diagnosis not present

## 2020-09-26 DIAGNOSIS — R002 Palpitations: Secondary | ICD-10-CM | POA: Diagnosis not present

## 2020-09-26 DIAGNOSIS — R42 Dizziness and giddiness: Secondary | ICD-10-CM | POA: Diagnosis not present

## 2020-10-03 DIAGNOSIS — R42 Dizziness and giddiness: Secondary | ICD-10-CM | POA: Diagnosis not present

## 2020-10-03 DIAGNOSIS — R002 Palpitations: Secondary | ICD-10-CM | POA: Diagnosis not present

## 2020-10-13 DIAGNOSIS — N390 Urinary tract infection, site not specified: Secondary | ICD-10-CM | POA: Diagnosis not present

## 2020-10-13 DIAGNOSIS — M545 Low back pain, unspecified: Secondary | ICD-10-CM | POA: Diagnosis not present

## 2020-10-13 DIAGNOSIS — R109 Unspecified abdominal pain: Secondary | ICD-10-CM | POA: Diagnosis not present

## 2020-10-13 DIAGNOSIS — R300 Dysuria: Secondary | ICD-10-CM | POA: Diagnosis not present

## 2020-11-02 DIAGNOSIS — M5136 Other intervertebral disc degeneration, lumbar region: Secondary | ICD-10-CM | POA: Diagnosis not present

## 2020-11-02 DIAGNOSIS — E669 Obesity, unspecified: Secondary | ICD-10-CM | POA: Diagnosis not present

## 2020-11-02 DIAGNOSIS — M8580 Other specified disorders of bone density and structure, unspecified site: Secondary | ICD-10-CM | POA: Diagnosis not present

## 2020-11-02 DIAGNOSIS — Z6837 Body mass index (BMI) 37.0-37.9, adult: Secondary | ICD-10-CM | POA: Diagnosis not present

## 2020-11-02 DIAGNOSIS — M0600 Rheumatoid arthritis without rheumatoid factor, unspecified site: Secondary | ICD-10-CM | POA: Diagnosis not present

## 2020-11-02 DIAGNOSIS — M150 Primary generalized (osteo)arthritis: Secondary | ICD-10-CM | POA: Diagnosis not present

## 2020-11-02 DIAGNOSIS — G629 Polyneuropathy, unspecified: Secondary | ICD-10-CM | POA: Diagnosis not present

## 2020-11-06 ENCOUNTER — Ambulatory Visit (HOSPITAL_COMMUNITY): Payer: Medicare HMO | Attending: Cardiology

## 2020-11-11 NOTE — Progress Notes (Deleted)
Cardiology Office Note:    Date:  11/11/2020   ID:  Elizabeth Roman, DOB [birth year], MRN [id number]  PCP:  Sigmund Hazel, MD   [facility]  Cardiologist:  No primary care provider on file.  Advanced Practice Provider:  No care team member to display Electrophysiologist:  None    Referring MD: Sigmund Hazel, MD    History of Present Illness:    Elizabeth Roman is a 70 y.o. female with a hx of HTN, HLD and OSA who returns to clinic for follow-up.  Was seen in the ED 07/06/20 for episode of lightheadedness and pre-syncope while shopping. UA there positive and patient had UTI and patient recently completed course of ABX. She was started on keflex in the ED. BG was also low at 61 which improved with eating. ECG with NSR, low voltage, no ischemia. She clinically improved and was sent home.   Last seen in clinic in 09/19/20 where she was having episodes of lightheadedness. We placed a cardiac monitor which showed 2 episodes of NS SVT but no significant arrhythmias or ectopy. She is also scheduled for TTE.  Family history: Father with CAD (died of MI at 62), paternal uncles and aunts with CAD.    Past Medical History:  Diagnosis Date  . Arthritis   . High cholesterol   . Hypertension   . Left hip pain 09/2019  . Multiple joint pain 03/2020   HANDICAP PLACARD   . Sleep apnea   . Thyroid disease     Past Surgical History:  Procedure Laterality Date  . ABDOMINAL HYSTERECTOMY    . BREAST BIOPSY    . BUNIONECTOMY    . CARPAL TUNNEL RELEASE Left   . HAND SURGERY Right   . MEDIAL PARTIAL KNEE REPLACEMENT    . TONSILLECTOMY      Current Medications: No outpatient medications have been marked as taking for the 11/15/20 encounter (Appointment) with Meriam Sprague, MD.     Allergies:   Morphine and related, Prednisone, Atorvastatin, Codeine, Ivp dye [iodinated diagnostic agents], Phenobarbital, Propoxyphene, Sulfa antibiotics, and Vicodin  [hydrocodone-acetaminophen]   Social History   Socioeconomic History  . Marital status: Married    Spouse name: Not on file  . Number of children: 8  . Years of education: Not on file  . Highest education level: Not on file  Occupational History  . Occupation: RETIRED HAIR DRESSER  Tobacco Use  . Smoking status: Never Smoker  . Smokeless tobacco: Never Used  Vaping Use  . Vaping Use: Never used  Substance and Sexual Activity  . Alcohol use: Yes    Comment: occ  . Drug use: No  . Sexual activity: Not on file  Other Topics Concern  . Not on file  Social History Narrative  . Not on file   Social Determinants of Health   Financial Resource Strain: Not on file  Food Insecurity: Not on file  Transportation Needs: Not on file  Physical Activity: Not on file  Stress: Not on file  Social Connections: Not on file     Family History: The patient's family history includes Breast cancer in her daughter and mother.  ROS:   Please see the history of present illness.    Review of Systems  Constitutional: Negative for chills and fever.  HENT: Negative for hearing loss and sore throat.   Eyes: Negative for blurred vision and redness.  Respiratory: Negative for shortness of breath.   Cardiovascular: Negative for chest pain,  palpitations, orthopnea, claudication, leg swelling and PND.  Gastrointestinal: Negative for nausea and vomiting.  Genitourinary: Negative for frequency.  Musculoskeletal: Positive for joint pain. Negative for falls.  Neurological: Positive for dizziness. Negative for loss of consciousness.  Endo/Heme/Allergies: Positive for environmental allergies.  Psychiatric/Behavioral: Negative for substance abuse.    EKGs/Labs/Other Studies Reviewed:    The following studies were reviewed today: Cardiac Monitor 10/03/20:  Patch wear time was 6 days and 22 hours  Predominant rhythm was NSR with average HR 85bpm (ranging from 65-169bpm)  2 episodes of  non-sustained SVT with longest lasting 6 beats at rate of 156bpm and fastest lasting 4 beats at rate 169bpm (not associated with patient triggered event)  Rare SVEs, rare PVCs <1%  No AFib, pauses or sustained arrhythmias    EKG:  EKG is  ordered today.  The ekg ordered today demonstrates NSR with HR 89  Recent Labs: 07/06/2020: BUN 32; Creatinine, Ser 0.99; Hemoglobin 13.3; Platelets 248; Potassium 5.1; Sodium 140  Recent Lipid Panel No results found for: CHOL, TRIG, HDL, CHOLHDL, VLDL, LDLCALC, LDLDIRECT    Physical Exam:    VS:  There were no vitals taken for this visit.    Wt Readings from Last 3 Encounters:  09/19/20 214 lb (97.1 kg)  07/06/20 216 lb (98 kg)     GEN:  Well nourished, well developed in no acute distress HEENT: Normal NECK: No JVD; No carotid bruits CARDIAC: RRR, no murmurs, rubs, gallops RESPIRATORY:  Clear to auscultation without rales, wheezing or rhonchi  ABDOMEN: Soft, non-tender, non-distended MUSCULOSKELETAL:  No edema; No deformity  SKIN: Warm and dry NEUROLOGIC:  Alert and oriented x 3 PSYCHIATRIC:  Normal affect   ASSESSMENT:    No diagnosis found. PLAN:    In order of problems listed above:  #Lightheadedness: Patient with episodes of dizziness over the past year that have worsened over the past several months. Specifically, episodes come on suddenly where she feels very lightheaded and like she may pass out before resolving. Symptoms are not related to position changes but always occur when she is standing. No associated palpitations, chest pain, SOB, orthopnea. No syncope. Cardiac monitor with no significant arrhythmias or ectopy. TTE pending -Monitor without significant arrhythmias or ectopy -TTE scheduled -Compression socks/abdominal binders -Discussed adequate hydration a length -Elevate legs when seated; slow position changes -Eat regular meals and ensure she has something with salt with her if she is out for a long period of time  in case the dizziness comes on -Patient will monitor her blood pressure when she has her dizzy spells and see if they correlate with low/high blood pressure -If symptoms worsen while in [place], told her she needs to be seen by a physician there for further work-up  #HTN: -Continue lisinopril 10mg  daily -Patient is going to log blood pressures for to review  #HLD: Managed by PCP. -Continue pravastatin 10mg  daily   Medication Adjustments/Labs and Tests Ordered: Current medicines are reviewed at length with the patient today.  Concerns regarding medicines are outlined above.  No orders of the defined types were placed in this encounter.  No orders of the defined types were placed in this encounter.   There are no Patient Instructions on file for this visit.   Signed, [place], MD  11/11/2020 2:01 PM    [facility]

## 2020-11-15 ENCOUNTER — Ambulatory Visit: Payer: Medicare HMO | Admitting: Cardiology

## 2020-11-15 ENCOUNTER — Encounter: Payer: Self-pay | Admitting: Cardiology

## 2020-11-15 ENCOUNTER — Other Ambulatory Visit: Payer: Self-pay

## 2020-11-15 VITALS — BP 138/90 | HR 81 | Ht 63.0 in | Wt 213.6 lb

## 2020-11-15 DIAGNOSIS — R42 Dizziness and giddiness: Secondary | ICD-10-CM | POA: Diagnosis not present

## 2020-11-15 DIAGNOSIS — E782 Mixed hyperlipidemia: Secondary | ICD-10-CM

## 2020-11-15 DIAGNOSIS — R2242 Localized swelling, mass and lump, left lower limb: Secondary | ICD-10-CM

## 2020-11-15 DIAGNOSIS — I10 Essential (primary) hypertension: Secondary | ICD-10-CM

## 2020-11-15 NOTE — Progress Notes (Signed)
Cardiology Office Note:    Date:  11/15/2020   ID:  Elizabeth Roman, DOB [birth year], MRN [id number]  PCP:  Sigmund Hazel, MD   [facility]  Cardiologist:  No primary care provider on file.  Advanced Practice Provider:  No care team member to display Electrophysiologist:  None    Referring MD: Sigmund Hazel, MD    History of Present Illness:    Elizabeth Roman is a 70 y.o. female with a hx of HTN, HLD and OSA who returns to clinic for follow-up.  Was seen in the ED 07/06/20 for episode of lightheadedness and pre-syncope while shopping. UA there positive and patient had UTI and patient recently completed course of ABX. She was started on keflex in the ED. BG was also low at 61 which improved with eating. ECG with NSR, low voltage, no ischemia. She clinically improved and was sent home.   Last seen in clinic in 09/19/20 where she was having episodes of lightheadedness. We placed a cardiac monitor which showed 2 episodes of NS SVT but no significant arrhythmias or ectopy. She is also scheduled for TTE.  Today, she feels good.  She reports her episodes of lightheadedness and dizziness have "not been as bad" lately.  She is drinking more fluids and has been trying to lose weight.  She is active and has tried to increase her walking. Her UTI has resolved. She denies any increased urinary frequency, fever, chills, or bleeding. Her blood pressure is well controlled at home.  No chest pains, syncope, orthopnea, or PND.   Of note, she is also concerned about some discoloration on her left lower extremity with focal swelling. This developed all of a sudden. No pain in the calf or shortness of breath. No history of DVTs.  Family history: Father with CAD (died of MI at 49), paternal uncles and aunts with CAD.    Past Medical History:  Diagnosis Date  . Arthritis   . High cholesterol   . Hypertension   . Left hip pain 09/2019  . Multiple joint pain 03/2020   HANDICAP PLACARD   .  Sleep apnea   . Thyroid disease     Past Surgical History:  Procedure Laterality Date  . ABDOMINAL HYSTERECTOMY    . BREAST BIOPSY    . BUNIONECTOMY    . CARPAL TUNNEL RELEASE Left   . HAND SURGERY Right   . MEDIAL PARTIAL KNEE REPLACEMENT    . TONSILLECTOMY      Current Medications: Current Meds  Medication Sig  . Bromelain 250 MG CAPS Take by mouth daily.  . Coenzyme Q10 (CO Q-10) 200 MG CAPS Take 400 mg by mouth at bedtime.  Marland Kitchen estradiol (ESTRACE) 0.1 MG/GM vaginal cream 2 (two) times a week.  . ferrous sulfate 325 (65 FE) MG tablet daily.  . hydroxychloroquine (PLAQUENIL) 200 MG tablet Take 200 mg by mouth 2 (two) times daily.  Marland Kitchen levothyroxine (SYNTHROID, LEVOTHROID) 75 MCG tablet Take 75 mcg by mouth daily before breakfast.  . lisinopril (PRINIVIL,ZESTRIL) 10 MG tablet Take 10 mg by mouth daily.  Marland Kitchen MAGNESIUM PO Take 400 mg by mouth daily.  . naproxen (NAPROSYN) 375 MG tablet Take 1 tablet (375 mg total) by mouth 2 (two) times daily.  . nitrofurantoin, macrocrystal-monohydrate, (MACROBID) 100 MG capsule Take 100 mg by mouth in the morning and at bedtime.  . pantoprazole (PROTONIX) 40 MG tablet Take 40 mg by mouth daily as needed (indigestion).  . pravastatin (PRAVACHOL) 10 MG  tablet Take 10 mg by mouth daily.  . Turmeric (CURCUMIN 95 PO) Take 1 tablet by mouth 2 (two) times daily.  Marland Kitchen VITAMIN B COMPLEX-C CAPS Take 1 capsule by mouth 2 (two) times daily.  . vitamin C (ASCORBIC ACID) 500 MG tablet Take 500 mg by mouth 2 (two) times daily.  Marland Kitchen zinc gluconate 50 MG tablet Take 50 mg by mouth daily.     Allergies:   Morphine and related, Prednisone, Atorvastatin, Codeine, Ivp dye [iodinated diagnostic agents], Phenobarbital, Propoxyphene, Sulfa antibiotics, and Vicodin [hydrocodone-acetaminophen]   Social History   Socioeconomic History  . Marital status: Married    Spouse name: Not on file  . Number of children: 8  . Years of education: Not on file  . Highest education  level: Not on file  Occupational History  . Occupation: RETIRED HAIR DRESSER  Tobacco Use  . Smoking status: Never Smoker  . Smokeless tobacco: Never Used  Vaping Use  . Vaping Use: Never used  Substance and Sexual Activity  . Alcohol use: Yes    Comment: occ  . Drug use: No  . Sexual activity: Not on file  Other Topics Concern  . Not on file  Social History Narrative  . Not on file   Social Determinants of Health   Financial Resource Strain: Not on file  Food Insecurity: Not on file  Transportation Needs: Not on file  Physical Activity: Not on file  Stress: Not on file  Social Connections: Not on file     Family History: The patient's family history includes Breast cancer in her daughter and mother.  ROS:   Please see the history of present illness.    Review of Systems  Constitutional: Negative for chills and fever.  HENT: Negative for hearing loss and sore throat.   Eyes: Negative for blurred vision and redness.  Respiratory: Positive for shortness of breath.   Cardiovascular: Negative for chest pain, palpitations, orthopnea, claudication, leg swelling and PND.  Gastrointestinal: Negative for nausea and vomiting.  Genitourinary: Negative for frequency.  Musculoskeletal: Positive for joint pain. Negative for falls.  Neurological: Positive for dizziness. Negative for loss of consciousness.  Endo/Heme/Allergies: Positive for environmental allergies.  Psychiatric/Behavioral: Negative for substance abuse.    EKGs/Labs/Other Studies Reviewed:    The following studies were reviewed today: Cardiac Monitor 10/03/20:  Patch wear time was 6 days and 22 hours  Predominant rhythm was NSR with average HR 85bpm (ranging from 65-169bpm)  2 episodes of non-sustained SVT with longest lasting 6 beats at rate of 156bpm and fastest lasting 4 beats at rate 169bpm (not associated with patient triggered event)  Rare SVEs, rare PVCs <1%  No AFib, pauses or sustained  arrhythmias  EKG:   11/15/20: EKG was not ordered today. 09/19/20: NSR with HR 89  Recent Labs: 07/06/2020: BUN 32; Creatinine, Ser 0.99; Hemoglobin 13.3; Platelets 248; Potassium 5.1; Sodium 140  Recent Lipid Panel No results found for: CHOL, TRIG, HDL, CHOLHDL, VLDL, LDLCALC, LDLDIRECT    Physical Exam:    VS:  BP 138/90   Pulse 81   Ht 5\' 3"  (1.6 m)   Wt 213 lb 9.6 oz (96.9 kg)   SpO2 99%   BMI 37.84 kg/m     Wt Readings from Last 3 Encounters:  11/15/20 213 lb 9.6 oz (96.9 kg)  09/19/20 214 lb (97.1 kg)  07/06/20 216 lb (98 kg)     GEN:  Well nourished, well developed in no acute distress HEENT: Normal NECK:  No JVD; No carotid bruits CARDIAC: RRR, no murmurs, rubs, gallops RESPIRATORY:  Clear to auscultation without rales, wheezing or rhonchi  ABDOMEN: Soft, non-tender, non-distended MUSCULOSKELETAL:  Left lower extremity with focal swelling and purple discoloration with varicosities. No calf tenderness. RLE normal with pedal varicosities. SKIN: Warm and dry NEUROLOGIC:  Alert and oriented x 3 PSYCHIATRIC:  Normal affect   ASSESSMENT:    1. Dizziness   2. Localized swelling of left lower extremity   3. Primary hypertension   4. Mixed hyperlipidemia    PLAN:    In order of problems listed above:  #Lightheadedness: Patient with episodes of dizziness over the past year that have worsened over the past several months. Specifically, episodes come on suddenly where she feels very lightheaded and like she may pass out before resolving. Symptoms are not related to position changes but always occur when she is standing. No associated palpitations, chest pain, SOB, orthopnea. No syncope. Cardiac monitor with no significant arrhythmias or ectopy. TTE pending -Monitor without significant arrhythmias or ectopy -TTE scheduled -Compression socks/abdominal binders -Discussed adequate hydration a length -Elevate legs when seated; slow position changes -Eat regular meals and  ensure she has something with salt with her if she is out for a long period of time in case the dizziness comes on -Patient will monitor her blood pressure when she has her dizzy spells and see if they correlate with low/high blood pressure -If symptoms worsen while in [place], told her she needs to be seen by a physician there for further work-up  #LLE focal swelling: Patient with mild focal swelling and discoloration of the LLE. Possible superficial thrombosis given multiple varicosities in the region. No calf pain or SOB with lower suspicion for DVT, but will obtain LE ultrasound to assess for DVT. -Obtain LLE doppler ultrasound  #HTN: -Continue lisinopril 10mg  daily -Patient is going to log blood pressures for [place] to review  #HLD: Managed by PCP. -Continue pravastatin 10mg  daily   Medication Adjustments/Labs and Tests Ordered: Current medicines are reviewed at length with the patient today.  Concerns regarding medicines are outlined above.  Orders Placed This Encounter  Procedures  . VAS [place] LOWER EXTREMITY VENOUS (DVT)   No orders of the defined types were placed in this encounter.   Patient Instructions  Medication Instructions:   Your physician recommends that you continue on your current medications as directed. Please refer to the Current Medication list given to you today.  *If you need a refill on your cardiac medications before your next appointment, please call your pharmacy*   Testing/Procedures:  Your physician has requested that you have a lower extremity venous duplex. This test is an ultrasound of the veins in the legs or arms. It looks at venous blood flow that carries blood from the heart to the legs or arms. Allow one hour for a Lower Venous exam. Allow thirty minutes for an Upper Venous exam. There are no restrictions or special instructions.   Follow-Up: At [facility], you and your health needs are our priority.  As part of our continuing mission to  provide you with exceptional heart care, we have created designated Provider Care Teams.  These Care Teams include your primary Cardiologist (physician) and Advanced Practice Providers (APPs -  Physician Assistants and Nurse Practitioners) who all work together to provide you with the care you need, when you need it.  We recommend signing up for the patient portal called "MyChart".  Sign up information is provided on this  After Visit Summary.  MyChart is used to connect with patients for Virtual Visits (Telemedicine).  Patients are able to view lab/test results, encounter notes, upcoming appointments, etc.  Non-urgent messages can be sent to your provider as well.   To learn more about what you can do with MyChart, go to [email or web address].    Your next appointment:   6 month(s)  The format for your next appointment:   In Person  Provider:   You will see one of the following Advanced Practice Providers on your designated Care Team:    Tereso Newcomer, PA-C  Vin [place], PA-C         Follow-up in 6 months.  I,Mathew Stumpf,acting as a Neurosurgeon for Meriam Sprague, MD.,have documented all relevant documentation on the behalf of Meriam Sprague, MD,as directed by  Meriam Sprague, MD while in the presence of Meriam Sprague, MD.  I, Meriam Sprague, MD, have reviewed all documentation for this visit. The documentation on 11/15/20 for the exam, diagnosis, procedures, and orders are all accurate and complete.  Signed, Meriam Sprague, MD  11/15/2020 10:30 AM    [facility]

## 2020-11-15 NOTE — Patient Instructions (Signed)
Medication Instructions:   Your physician recommends that you continue on your current medications as directed. Please refer to the Current Medication list given to you today.  *If you need a refill on your cardiac medications before your next appointment, please call your pharmacy*   Testing/Procedures:  Your physician has requested that you have a lower extremity venous duplex. This test is an ultrasound of the veins in the legs or arms. It looks at venous blood flow that carries blood from the heart to the legs or arms. Allow one hour for a Lower Venous exam. Allow thirty minutes for an Upper Venous exam. There are no restrictions or special instructions.   Follow-Up: At [facility], you and your health needs are our priority.  As part of our continuing mission to provide you with exceptional heart care, we have created designated Provider Care Teams.  These Care Teams include your primary Cardiologist (physician) and Advanced Practice Providers (APPs -  Physician Assistants and Nurse Practitioners) who all work together to provide you with the care you need, when you need it.  We recommend signing up for the patient portal called "MyChart".  Sign up information is provided on this After Visit Summary.  MyChart is used to connect with patients for Virtual Visits (Telemedicine).  Patients are able to view lab/test results, encounter notes, upcoming appointments, etc.  Non-urgent messages can be sent to your provider as well.   To learn more about what you can do with MyChart, go to [email or web address].    Your next appointment:   6 month(s)  The format for your next appointment:   In Person  Provider:   You will see one of the following Advanced Practice Providers on your designated Care Team:    Tereso Newcomer, PA-C  Vin [place], [place]

## 2020-11-16 ENCOUNTER — Ambulatory Visit (HOSPITAL_COMMUNITY)
Admission: RE | Admit: 2020-11-16 | Discharge: 2020-11-16 | Disposition: A | Discharge status: Home / Self Care | Payer: Medicare HMO | Source: Ambulatory Visit | Attending: Cardiology | Admitting: Cardiology

## 2020-11-16 DIAGNOSIS — R2242 Localized swelling, mass and lump, left lower limb: Secondary | ICD-10-CM | POA: Diagnosis not present

## 2020-11-20 DIAGNOSIS — M67961 Unspecified disorder of synovium and tendon, right lower leg: Secondary | ICD-10-CM | POA: Diagnosis not present

## 2020-11-20 DIAGNOSIS — M67962 Unspecified disorder of synovium and tendon, left lower leg: Secondary | ICD-10-CM | POA: Diagnosis not present

## 2020-11-20 DIAGNOSIS — L84 Corns and callosities: Secondary | ICD-10-CM | POA: Diagnosis not present

## 2020-11-22 DIAGNOSIS — I129 Hypertensive chronic kidney disease with stage 1 through stage 4 chronic kidney disease, or unspecified chronic kidney disease: Secondary | ICD-10-CM | POA: Diagnosis not present

## 2020-11-22 DIAGNOSIS — R42 Dizziness and giddiness: Secondary | ICD-10-CM | POA: Diagnosis not present

## 2020-11-22 DIAGNOSIS — E039 Hypothyroidism, unspecified: Secondary | ICD-10-CM | POA: Diagnosis not present

## 2020-11-22 DIAGNOSIS — E7800 Pure hypercholesterolemia, unspecified: Secondary | ICD-10-CM | POA: Diagnosis not present

## 2020-11-22 DIAGNOSIS — Z6837 Body mass index (BMI) 37.0-37.9, adult: Secondary | ICD-10-CM | POA: Diagnosis not present

## 2020-11-22 DIAGNOSIS — R7303 Prediabetes: Secondary | ICD-10-CM | POA: Diagnosis not present

## 2020-12-05 DIAGNOSIS — G4733 Obstructive sleep apnea (adult) (pediatric): Secondary | ICD-10-CM | POA: Diagnosis not present

## 2020-12-05 DIAGNOSIS — Z1389 Encounter for screening for other disorder: Secondary | ICD-10-CM | POA: Diagnosis not present

## 2020-12-05 DIAGNOSIS — I10 Essential (primary) hypertension: Secondary | ICD-10-CM | POA: Diagnosis not present

## 2020-12-05 DIAGNOSIS — R350 Frequency of micturition: Secondary | ICD-10-CM | POA: Diagnosis not present

## 2020-12-05 DIAGNOSIS — M8580 Other specified disorders of bone density and structure, unspecified site: Secondary | ICD-10-CM | POA: Diagnosis not present

## 2020-12-05 DIAGNOSIS — R7303 Prediabetes: Secondary | ICD-10-CM | POA: Diagnosis not present

## 2020-12-05 DIAGNOSIS — E7800 Pure hypercholesterolemia, unspecified: Secondary | ICD-10-CM | POA: Diagnosis not present

## 2020-12-05 DIAGNOSIS — N390 Urinary tract infection, site not specified: Secondary | ICD-10-CM | POA: Diagnosis not present

## 2020-12-05 DIAGNOSIS — E039 Hypothyroidism, unspecified: Secondary | ICD-10-CM | POA: Diagnosis not present

## 2020-12-08 ENCOUNTER — Other Ambulatory Visit: Payer: Self-pay | Admitting: Internal Medicine

## 2020-12-08 ENCOUNTER — Other Ambulatory Visit (HOSPITAL_COMMUNITY): Payer: Medicare HMO

## 2020-12-08 DIAGNOSIS — M8580 Other specified disorders of bone density and structure, unspecified site: Secondary | ICD-10-CM

## 2020-12-19 ENCOUNTER — Ambulatory Visit (HOSPITAL_COMMUNITY): Payer: Medicare HMO | Attending: Internal Medicine

## 2020-12-19 ENCOUNTER — Other Ambulatory Visit: Payer: Self-pay

## 2020-12-19 ENCOUNTER — Telehealth: Payer: Self-pay | Admitting: Cardiology

## 2020-12-19 DIAGNOSIS — E785 Hyperlipidemia, unspecified: Secondary | ICD-10-CM | POA: Insufficient documentation

## 2020-12-19 DIAGNOSIS — I340 Nonrheumatic mitral (valve) insufficiency: Secondary | ICD-10-CM

## 2020-12-19 DIAGNOSIS — I10 Essential (primary) hypertension: Secondary | ICD-10-CM | POA: Insufficient documentation

## 2020-12-19 DIAGNOSIS — R002 Palpitations: Secondary | ICD-10-CM | POA: Insufficient documentation

## 2020-12-19 DIAGNOSIS — E669 Obesity, unspecified: Secondary | ICD-10-CM | POA: Insufficient documentation

## 2020-12-19 DIAGNOSIS — R42 Dizziness and giddiness: Secondary | ICD-10-CM

## 2020-12-19 DIAGNOSIS — E079 Disorder of thyroid, unspecified: Secondary | ICD-10-CM | POA: Insufficient documentation

## 2020-12-19 DIAGNOSIS — I350 Nonrheumatic aortic (valve) stenosis: Secondary | ICD-10-CM

## 2020-12-19 DIAGNOSIS — G4730 Sleep apnea, unspecified: Secondary | ICD-10-CM | POA: Insufficient documentation

## 2020-12-19 LAB — ECHOCARDIOGRAM COMPLETE
Area-P 1/2: 2.76 cm2
S' Lateral: 3 cm

## 2020-12-19 NOTE — Telephone Encounter (Signed)
Patient returning call for echo results. 

## 2020-12-19 NOTE — Telephone Encounter (Signed)
-----   Message from Meriam Sprague, MD sent at 12/19/2020  1:41 PM EDT ----- Her echo looks great with normal pumping function and no significant valve disease.

## 2020-12-19 NOTE — Telephone Encounter (Signed)
The patient has been notified of the result and verbalized understanding.  All questions (if any) were answered. Loa Socks, LPN 04/11/7828 5:62 PM

## 2021-01-24 DIAGNOSIS — L84 Corns and callosities: Secondary | ICD-10-CM | POA: Diagnosis not present

## 2021-01-24 DIAGNOSIS — M67961 Unspecified disorder of synovium and tendon, right lower leg: Secondary | ICD-10-CM | POA: Diagnosis not present

## 2021-01-24 DIAGNOSIS — M67962 Unspecified disorder of synovium and tendon, left lower leg: Secondary | ICD-10-CM | POA: Diagnosis not present

## 2021-02-06 DIAGNOSIS — M25562 Pain in left knee: Secondary | ICD-10-CM | POA: Diagnosis not present

## 2021-02-07 DIAGNOSIS — N390 Urinary tract infection, site not specified: Secondary | ICD-10-CM | POA: Diagnosis not present

## 2021-02-07 DIAGNOSIS — M25562 Pain in left knee: Secondary | ICD-10-CM | POA: Diagnosis not present

## 2021-02-08 DIAGNOSIS — M25562 Pain in left knee: Secondary | ICD-10-CM | POA: Diagnosis not present

## 2021-02-12 DIAGNOSIS — R69 Illness, unspecified: Secondary | ICD-10-CM | POA: Diagnosis not present

## 2021-02-12 DIAGNOSIS — M25562 Pain in left knee: Secondary | ICD-10-CM | POA: Diagnosis not present

## 2021-02-13 DIAGNOSIS — M25562 Pain in left knee: Secondary | ICD-10-CM | POA: Diagnosis not present

## 2021-03-08 DIAGNOSIS — I10 Essential (primary) hypertension: Secondary | ICD-10-CM | POA: Diagnosis not present

## 2021-03-08 DIAGNOSIS — Z6838 Body mass index (BMI) 38.0-38.9, adult: Secondary | ICD-10-CM | POA: Diagnosis not present

## 2021-03-12 DIAGNOSIS — M150 Primary generalized (osteo)arthritis: Secondary | ICD-10-CM | POA: Diagnosis not present

## 2021-03-12 DIAGNOSIS — M5136 Other intervertebral disc degeneration, lumbar region: Secondary | ICD-10-CM | POA: Diagnosis not present

## 2021-03-12 DIAGNOSIS — Z79899 Other long term (current) drug therapy: Secondary | ICD-10-CM | POA: Diagnosis not present

## 2021-03-12 DIAGNOSIS — E669 Obesity, unspecified: Secondary | ICD-10-CM | POA: Diagnosis not present

## 2021-03-12 DIAGNOSIS — M0600 Rheumatoid arthritis without rheumatoid factor, unspecified site: Secondary | ICD-10-CM | POA: Diagnosis not present

## 2021-03-12 DIAGNOSIS — Z6837 Body mass index (BMI) 37.0-37.9, adult: Secondary | ICD-10-CM | POA: Diagnosis not present

## 2021-03-23 DIAGNOSIS — L84 Corns and callosities: Secondary | ICD-10-CM | POA: Diagnosis not present

## 2021-03-23 DIAGNOSIS — M67961 Unspecified disorder of synovium and tendon, right lower leg: Secondary | ICD-10-CM | POA: Diagnosis not present

## 2021-03-23 DIAGNOSIS — M67962 Unspecified disorder of synovium and tendon, left lower leg: Secondary | ICD-10-CM | POA: Diagnosis not present

## 2021-03-29 ENCOUNTER — Other Ambulatory Visit: Payer: Self-pay | Admitting: Internal Medicine

## 2021-03-29 DIAGNOSIS — Z1231 Encounter for screening mammogram for malignant neoplasm of breast: Secondary | ICD-10-CM

## 2021-05-03 ENCOUNTER — Other Ambulatory Visit: Payer: Self-pay

## 2021-05-03 ENCOUNTER — Ambulatory Visit
Admission: RE | Admit: 2021-05-03 | Discharge: 2021-05-03 | Disposition: A | Discharge status: Home / Self Care | Payer: Medicare HMO | Source: Ambulatory Visit | Attending: Internal Medicine | Admitting: Internal Medicine

## 2021-05-03 DIAGNOSIS — Z1231 Encounter for screening mammogram for malignant neoplasm of breast: Secondary | ICD-10-CM

## 2021-05-04 ENCOUNTER — Encounter (HOSPITAL_BASED_OUTPATIENT_CLINIC_OR_DEPARTMENT_OTHER): Payer: Self-pay | Admitting: Family

## 2021-05-04 ENCOUNTER — Ambulatory Visit (HOSPITAL_BASED_OUTPATIENT_CLINIC_OR_DEPARTMENT_OTHER): Payer: Medicare HMO | Admitting: Family

## 2021-05-04 VITALS — BP 142/98 | HR 72 | Ht 63.0 in | Wt 220.2 lb

## 2021-05-04 DIAGNOSIS — G4733 Obstructive sleep apnea (adult) (pediatric): Secondary | ICD-10-CM

## 2021-05-04 DIAGNOSIS — R42 Dizziness and giddiness: Secondary | ICD-10-CM | POA: Diagnosis not present

## 2021-05-04 DIAGNOSIS — I10 Essential (primary) hypertension: Secondary | ICD-10-CM

## 2021-05-04 MED ORDER — LISINOPRIL 20 MG PO TABS: 20.0000 mg | ORAL_TABLET | Freq: Every day | ORAL | 1 refills | Status: DC

## 2021-05-04 NOTE — Patient Instructions (Addendum)
Medication Instructions:  Your physician has recommended you make the following change in your medication:   CHANGE your Lisinopril to one 20mg  tablet daily  *If you need a refill on your cardiac medications before your next appointment, please call your pharmacy*   Lab Work: Your physician recommends that you return for lab work in 2 weeks for BMP.   If you have labs (blood work) drawn today and your tests are completely normal, you will receive your results only by: MyChart Message (if you have MyChart) OR A paper copy in the mail If you have any lab test that is abnormal or we need to change your treatment, we will call you to review the results.   Testing/Procedures: Your EKG today showed normal sinus rhythm. This is a good result! Your recent echocardiogram showed normal heart pumping function and normal heart valves.    Follow-Up: At [facility], you and your health needs are our priority.  As part of our continuing mission to provide you with exceptional heart care, we have created designated Provider Care Teams.  These Care Teams include your primary Cardiologist (physician) and Advanced Practice Providers (APPs -  Physician Assistants and Nurse Practitioners) who all work together to provide you with the care you need, when you need it.  We recommend signing up for the patient portal called "MyChart".  Sign up information is provided on this After Visit Summary.  MyChart is used to connect with patients for Virtual Visits (Telemedicine).  Patients are able to view lab/test results, encounter notes, upcoming appointments, etc.  Non-urgent messages can be sent to your provider as well.   To learn more about what you can do with MyChart, go to [facility].    Your next appointment:   6 month(s)  The format for your next appointment:   In Person  Provider:   You may see [email or web address], MD or one of the following Advanced Practice Providers on your  designated Care Team:   Meriam Sprague, PA-C Vin [place], [place], NP    Other Instructions  Tips to Measure your Blood Pressure Correctly  To determine whether you have hypertension, a medical professional will take a blood pressure reading. How you prepare for the test, the position of your arm, and other factors can change a blood pressure reading by 10% or more. That could be enough to hide high blood pressure, start you on a drug you don't really need, or lead your doctor to incorrectly adjust your medications.  National and international guidelines offer specific instructions for measuring blood pressure. If a doctor, nurse, or medical assistant isn't doing it right, don't hesitate to ask him or her to get with the guidelines.  Here's what you can do to ensure a correct reading:  Don't drink a caffeinated beverage or smoke during the 30 minutes before the test.  Sit quietly for five minutes before the test begins.  During the measurement, sit in a chair with your feet on the floor and your arm supported so your elbow is at about heart level.  The inflatable part of the cuff should completely cover at least 80% of your upper arm, and the cuff should be placed on bare skin, not over a shirt.  Don't talk during the measurement.  Have your blood pressure measured twice, with a brief break in between. If the readings are different by 5 points or more, have it done a third time.  In 2017, new guidelines from  the American Heart Association, the [employer] of Cardiology, and nine other health organizations lowered the diagnosis of high blood pressure to 130/80 mm Hg or higher for all adults. The guidelines also redefined the various blood pressure categories to now include normal, elevated, Stage 1 hypertension, Stage 2 hypertension, and hypertensive crisis (see "Blood pressure categories").  Blood pressure categories  Blood pressure category SYSTOLIC (upper number)   DIASTOLIC (lower number)  Normal Less than 120 mm Hg and Less than 80 mm Hg  Elevated 120-129 mm Hg and Less than 80 mm Hg  High blood pressure: Stage 1 hypertension 130-139 mm Hg or 80-89 mm Hg  High blood pressure: Stage 2 hypertension 140 mm Hg or higher or 90 mm Hg or higher  Hypertensive crisis (consult your doctor immediately) Higher than 180 mm Hg and/or Higher than 120 mm Hg  Source: American Heart Association and American Stroke Association. For more on getting your blood pressure under control, buy Controlling Your Blood Pressure, a Special Health Report from [facility]

## 2021-05-04 NOTE — Progress Notes (Signed)
Office Visit    Patient Name: Elizabeth Roman Date of Encounter: 05/04/2021  PCP:  Georgann Housekeeper, MD   [facility]  Cardiologist:  Meriam Sprague, MD  Advanced Practice Provider:  No care team member to display Electrophysiologist:  None      Chief Complaint    Elizabeth Roman is a 70 y.o. female with a hx of HTN, HLD, OSA presents today for follow-up of hypertension  Past Medical History    Past Medical History:  Diagnosis Date   Arthritis    High cholesterol    Hypertension    Left hip pain 09/2019   Multiple joint pain 03/2020   HANDICAP PLACARD    Sleep apnea    Thyroid disease    Past Surgical History:  Procedure Laterality Date   ABDOMINAL HYSTERECTOMY     BREAST BIOPSY     BUNIONECTOMY     CARPAL TUNNEL RELEASE Left    HAND SURGERY Right    MEDIAL PARTIAL KNEE REPLACEMENT     TONSILLECTOMY      Allergies  Allergies  Allergen Reactions   Morphine And Related Anaphylaxis   Prednisone Hives and Other (See Comments)   Atorvastatin Other (See Comments)   Codeine Other (See Comments)    Very ill.   Ivp Dye [Iodinated Diagnostic Agents] Hives   Phenobarbital Hives   Propoxyphene    Sulfa Antibiotics Hives   Vicodin [Hydrocodone-Acetaminophen] Other (See Comments)    Very ill.    History of Present Illness    Elizabeth Roman is a 70 y.o. female with a hx of pretension, hyperlipidemia, sleep apnea last seen 11/15/2020 by Dr. Shari Prows.  Seen in the ED December 2021 for lightheadedness and presyncope while shopping.  UA was positive and treated with antibiotics.  Blood glucose was also low at 61.  EKG showed normal sinus rhythm, low voltage, no ischemia.  Seen in clinic February 2022 with episodes of lightheadedness.  Cardiac monitoring showed 2 episodes of NSVT but no significant arrhythmias or ectopy.  Evaluated 10/2020 by Dr. Shari Prows noting lower extremity edema with duplex negative for DVT.  Subsequent echocardiogram 12/19/2020 with  normal LVEF, no R WMA, determinate diastolic parameters, mild MR.  She presents today for follow-up.  Reports very intermittent lightheadedness but no near-syncope nor syncope.  Lightheadedness self resolves after a few minutes and happens at most once per week.  Occurs most often with position changes.  She is walking regularly for exercise and does not notice lightheadedness with this activity.  Reports no chest pain, pressure, tightness.  Reports stable dyspnea on exertion with more than usual activity.  She is working on weight loss. She has started wearing compression stockings which has improved her edema. She does note that occasionally in the middle of the night she will get really bad pain in her midsternal region below her sternum which resolves with belching. She takes her Protonix only as needed.  We discussed etiology of this discomfort is likely acid reflux.   EKGs/Labs/Other Studies Reviewed:   The following studies were reviewed today:   EKG:  EKG is ordered today.  The ekg ordered today demonstrates NSR 72 bpm with no acute St/T wave changes.   Recent Labs: 07/06/2020: BUN 32; Creatinine, Ser 0.99; Hemoglobin 13.3; Platelets 248; Potassium 5.1; Sodium 140  Recent Lipid Panel No results found for: CHOL, TRIG, HDL, CHOLHDL, VLDL, LDLCALC, LDLDIRECT   Home Medications   Current Meds  Medication Sig   Bromelain 250  MG CAPS Take by mouth daily.   Coenzyme Q10 (CO Q-10) 200 MG CAPS Take 400 mg by mouth at bedtime.   estradiol (ESTRACE) 0.1 MG/GM vaginal cream 2 (two) times a week.   ferrous sulfate 325 (65 FE) MG tablet daily.   levothyroxine (SYNTHROID, LEVOTHROID) 75 MCG tablet Take 75 mcg by mouth daily before breakfast.   MAGNESIUM PO Take 400 mg by mouth daily.   naproxen (NAPROSYN) 375 MG tablet Take 1 tablet (375 mg total) by mouth 2 (two) times daily. (Patient taking differently: Take 375 mg by mouth as needed.)   nitrofurantoin, macrocrystal-monohydrate, (MACROBID) 100  MG capsule Take 100 mg by mouth at bedtime.   pantoprazole (PROTONIX) 40 MG tablet Take 40 mg by mouth daily as needed (indigestion).   pravastatin (PRAVACHOL) 10 MG tablet Take 10 mg by mouth daily.   Turmeric (CURCUMIN 95 PO) Take 1 tablet by mouth 2 (two) times daily.   VITAMIN B COMPLEX-C CAPS Take 1 capsule by mouth 2 (two) times daily.   vitamin C (ASCORBIC ACID) 500 MG tablet Take 500 mg by mouth 2 (two) times daily.   zinc gluconate 50 MG tablet Take 50 mg by mouth daily.   [DISCONTINUED] lisinopril (PRINIVIL,ZESTRIL) 10 MG tablet Take 10 mg by mouth daily.   [DISCONTINUED] lisinopril (ZESTRIL) 20 MG tablet Take 1 tablet (20 mg total) by mouth daily.     Review of Systems      All other systems reviewed and are otherwise negative except as noted above.  Physical Exam    VS:  BP (!) 142/98   Pulse 72   Ht 5\' 3"  (1.6 m)   Wt 220 lb 3.2 oz (99.9 kg)   BMI 39.01 kg/m  , BMI Body mass index is 39.01 kg/m.  Wt Readings from Last 3 Encounters:  05/04/21 220 lb 3.2 oz (99.9 kg)  11/15/20 213 lb 9.6 oz (96.9 kg)  09/19/20 214 lb (97.1 kg)    GEN: Well nourished, overweight, well developed, in no acute distress. HEENT: normal. Neck: Supple, no JVD, carotid bruits, or masses. Cardiac: RRR, no murmurs, rubs, or gallops. No clubbing, cyanosis, edema.  Radials/PT 2+ and equal bilaterally.  Respiratory:  Respirations regular and unlabored, clear to auscultation bilaterally. GI: Soft, nontender, nondistended. MS: No deformity or atrophy. Skin: Warm and dry, no rash. Neuro:  Strength and sensation are intact. Psych: Normal affect.  Assessment & Plan    HTN - BP elevated. Increase Lisinopril to 20mg  daily with BMP in 2 weeks. Home monitoring encouraged. She will report BP consistently >130/80.   LE edema - Improved with compression stockings which she will continue.   Lightheadedness- Echo 11/2020 with normal LVEF and no significant valvular abnormalities. Likely etiology  orthostasis. Continue adequate hydration and slow position changes.   OSA - CPAP compliance encouraged.   Disposition: Follow up in 6 month(s) with Dr. or APP.  Signed, 12/2020, NP 05/04/2021, 1:06 PM [facility]

## 2021-05-10 DIAGNOSIS — R350 Frequency of micturition: Secondary | ICD-10-CM | POA: Diagnosis not present

## 2021-05-14 DIAGNOSIS — H2513 Age-related nuclear cataract, bilateral: Secondary | ICD-10-CM | POA: Diagnosis not present

## 2021-05-15 DIAGNOSIS — R109 Unspecified abdominal pain: Secondary | ICD-10-CM | POA: Diagnosis not present

## 2021-05-17 ENCOUNTER — Ambulatory Visit
Admission: RE | Admit: 2021-05-17 | Discharge: 2021-05-17 | Disposition: A | Discharge status: Home / Self Care | Payer: Medicare HMO | Source: Ambulatory Visit | Attending: Internal Medicine | Admitting: Internal Medicine

## 2021-05-17 ENCOUNTER — Other Ambulatory Visit: Payer: Self-pay | Admitting: Internal Medicine

## 2021-05-17 DIAGNOSIS — R109 Unspecified abdominal pain: Secondary | ICD-10-CM

## 2021-05-17 DIAGNOSIS — R1030 Lower abdominal pain, unspecified: Secondary | ICD-10-CM | POA: Diagnosis not present

## 2021-05-17 DIAGNOSIS — K449 Diaphragmatic hernia without obstruction or gangrene: Secondary | ICD-10-CM | POA: Diagnosis not present

## 2021-05-18 DIAGNOSIS — I10 Essential (primary) hypertension: Secondary | ICD-10-CM | POA: Diagnosis not present

## 2021-05-18 LAB — BASIC METABOLIC PANEL
BUN/Creatinine Ratio: 18 (ref 12–28)
BUN: 19 mg/dL (ref 8–27)
CO2: 21 mmol/L (ref 20–29)
Calcium: 9.8 mg/dL (ref 8.7–10.3)
Chloride: 102 mmol/L (ref 96–106)
Creatinine, Ser: 1.05 mg/dL — ABNORMAL HIGH (ref 0.57–1.00)
Glucose: 106 mg/dL — ABNORMAL HIGH (ref 70–99)
Potassium: 5.4 mmol/L — ABNORMAL HIGH (ref 3.5–5.2)
Sodium: 138 mmol/L (ref 134–144)
eGFR: 56 mL/min/{1.73_m2} — ABNORMAL LOW (ref >59)

## 2021-05-21 ENCOUNTER — Telehealth: Payer: Self-pay

## 2021-05-21 DIAGNOSIS — I700 Atherosclerosis of aorta: Secondary | ICD-10-CM | POA: Diagnosis not present

## 2021-05-21 DIAGNOSIS — I10 Essential (primary) hypertension: Secondary | ICD-10-CM

## 2021-05-21 DIAGNOSIS — E875 Hyperkalemia: Secondary | ICD-10-CM

## 2021-05-21 DIAGNOSIS — K5792 Diverticulitis of intestine, part unspecified, without perforation or abscess without bleeding: Secondary | ICD-10-CM | POA: Diagnosis not present

## 2021-05-21 DIAGNOSIS — N289 Disorder of kidney and ureter, unspecified: Secondary | ICD-10-CM

## 2021-05-21 MED ORDER — LISINOPRIL 20 MG PO TABS: 10.0000 mg | ORAL_TABLET | Freq: Every day | ORAL | 1 refills | Status: DC

## 2021-05-21 NOTE — Telephone Encounter (Signed)
Spoke with pt and advised per Gillian Shields, NP :Potassium elevated and kidney function slightly decreased from previous. Likely due to increased dose Lisinopril. Recommend reduce hold Lisinopril for 1 day then resume at 10mg  daily. Repeat BMP on Thursday or Friday of this week.  Pt verbalizes understanding and states she will go to [facility] on Friday to have lab drawn.

## 2021-05-21 NOTE — Telephone Encounter (Signed)
-----   Message from Alver Sorrow, NP sent at 05/21/2021 12:10 PM EDT ----- Potassium elevated and kidney function slightly decreased from previous. Likely due to increased dose Lisinopril. Recommend reduce hold Lisinopril for 1 day then resume at 10mg  daily. Repeat BMP on Thursday or Friday of this week.

## 2021-05-25 DIAGNOSIS — N289 Disorder of kidney and ureter, unspecified: Secondary | ICD-10-CM | POA: Diagnosis not present

## 2021-05-25 DIAGNOSIS — Z20828 Contact with and (suspected) exposure to other viral communicable diseases: Secondary | ICD-10-CM | POA: Diagnosis not present

## 2021-05-25 DIAGNOSIS — E875 Hyperkalemia: Secondary | ICD-10-CM | POA: Diagnosis not present

## 2021-05-26 LAB — BASIC METABOLIC PANEL
BUN/Creatinine Ratio: 23 (ref 12–28)
BUN: 24 mg/dL (ref 8–27)
CO2: 21 mmol/L (ref 20–29)
Calcium: 10 mg/dL (ref 8.7–10.3)
Chloride: 104 mmol/L (ref 96–106)
Creatinine, Ser: 1.06 mg/dL — ABNORMAL HIGH (ref 0.57–1.00)
Glucose: 93 mg/dL (ref 70–99)
Potassium: 5.1 mmol/L (ref 3.5–5.2)
Sodium: 138 mmol/L (ref 134–144)
eGFR: 56 mL/min/{1.73_m2} — ABNORMAL LOW (ref >59)

## 2021-05-31 ENCOUNTER — Other Ambulatory Visit: Payer: Self-pay

## 2021-05-31 ENCOUNTER — Ambulatory Visit
Admission: RE | Admit: 2021-05-31 | Discharge: 2021-05-31 | Disposition: A | Discharge status: Home / Self Care | Payer: Medicare HMO | Source: Ambulatory Visit | Attending: Internal Medicine | Admitting: Internal Medicine

## 2021-05-31 DIAGNOSIS — M8589 Other specified disorders of bone density and structure, multiple sites: Secondary | ICD-10-CM | POA: Diagnosis not present

## 2021-05-31 DIAGNOSIS — M8580 Other specified disorders of bone density and structure, unspecified site: Secondary | ICD-10-CM

## 2021-05-31 DIAGNOSIS — Z780 Asymptomatic menopausal state: Secondary | ICD-10-CM | POA: Diagnosis not present

## 2021-06-11 DIAGNOSIS — G4733 Obstructive sleep apnea (adult) (pediatric): Secondary | ICD-10-CM | POA: Diagnosis not present

## 2021-06-15 DIAGNOSIS — M67961 Unspecified disorder of synovium and tendon, right lower leg: Secondary | ICD-10-CM | POA: Diagnosis not present

## 2021-06-15 DIAGNOSIS — L84 Corns and callosities: Secondary | ICD-10-CM | POA: Diagnosis not present

## 2021-06-15 DIAGNOSIS — M67962 Unspecified disorder of synovium and tendon, left lower leg: Secondary | ICD-10-CM | POA: Diagnosis not present

## 2021-06-27 DIAGNOSIS — R7303 Prediabetes: Secondary | ICD-10-CM | POA: Diagnosis not present

## 2021-06-27 DIAGNOSIS — M8580 Other specified disorders of bone density and structure, unspecified site: Secondary | ICD-10-CM | POA: Diagnosis not present

## 2021-06-27 DIAGNOSIS — G4733 Obstructive sleep apnea (adult) (pediatric): Secondary | ICD-10-CM | POA: Diagnosis not present

## 2021-06-27 DIAGNOSIS — E7800 Pure hypercholesterolemia, unspecified: Secondary | ICD-10-CM | POA: Diagnosis not present

## 2021-06-27 DIAGNOSIS — E039 Hypothyroidism, unspecified: Secondary | ICD-10-CM | POA: Diagnosis not present

## 2021-06-27 DIAGNOSIS — I10 Essential (primary) hypertension: Secondary | ICD-10-CM | POA: Diagnosis not present

## 2021-06-29 DIAGNOSIS — Z0000 Encounter for general adult medical examination without abnormal findings: Secondary | ICD-10-CM | POA: Diagnosis not present

## 2021-06-29 DIAGNOSIS — M069 Rheumatoid arthritis, unspecified: Secondary | ICD-10-CM | POA: Diagnosis not present

## 2021-06-29 DIAGNOSIS — I700 Atherosclerosis of aorta: Secondary | ICD-10-CM | POA: Diagnosis not present

## 2021-06-29 DIAGNOSIS — N1831 Chronic kidney disease, stage 3a: Secondary | ICD-10-CM | POA: Diagnosis not present

## 2021-06-29 DIAGNOSIS — R7303 Prediabetes: Secondary | ICD-10-CM | POA: Diagnosis not present

## 2021-06-29 DIAGNOSIS — E7800 Pure hypercholesterolemia, unspecified: Secondary | ICD-10-CM | POA: Diagnosis not present

## 2021-06-29 DIAGNOSIS — E039 Hypothyroidism, unspecified: Secondary | ICD-10-CM | POA: Diagnosis not present

## 2021-06-29 DIAGNOSIS — I10 Essential (primary) hypertension: Secondary | ICD-10-CM | POA: Diagnosis not present

## 2021-06-29 DIAGNOSIS — Z6837 Body mass index (BMI) 37.0-37.9, adult: Secondary | ICD-10-CM | POA: Diagnosis not present

## 2021-06-29 DIAGNOSIS — Z1389 Encounter for screening for other disorder: Secondary | ICD-10-CM | POA: Diagnosis not present

## 2021-06-29 DIAGNOSIS — G4733 Obstructive sleep apnea (adult) (pediatric): Secondary | ICD-10-CM | POA: Diagnosis not present

## 2021-06-29 DIAGNOSIS — Z1331 Encounter for screening for depression: Secondary | ICD-10-CM | POA: Diagnosis not present

## 2021-06-29 DIAGNOSIS — M8580 Other specified disorders of bone density and structure, unspecified site: Secondary | ICD-10-CM | POA: Diagnosis not present

## 2021-08-09 DIAGNOSIS — H524 Presbyopia: Secondary | ICD-10-CM | POA: Diagnosis not present

## 2021-08-15 ENCOUNTER — Ambulatory Visit
Admission: RE | Admit: 2021-08-15 | Discharge: 2021-08-15 | Disposition: A | Discharge status: Home / Self Care | Payer: Medicare HMO | Source: Ambulatory Visit | Attending: Internal Medicine | Admitting: Internal Medicine

## 2021-08-15 ENCOUNTER — Other Ambulatory Visit: Payer: Self-pay | Admitting: Internal Medicine

## 2021-08-15 DIAGNOSIS — M542 Cervicalgia: Secondary | ICD-10-CM | POA: Diagnosis not present

## 2021-08-15 DIAGNOSIS — M25569 Pain in unspecified knee: Secondary | ICD-10-CM

## 2021-08-15 DIAGNOSIS — M25539 Pain in unspecified wrist: Secondary | ICD-10-CM

## 2021-08-15 DIAGNOSIS — M25561 Pain in right knee: Secondary | ICD-10-CM | POA: Diagnosis not present

## 2021-08-15 DIAGNOSIS — R519 Headache, unspecified: Secondary | ICD-10-CM | POA: Diagnosis not present

## 2021-08-15 DIAGNOSIS — S0990XA Unspecified injury of head, initial encounter: Secondary | ICD-10-CM | POA: Diagnosis not present

## 2021-08-15 DIAGNOSIS — M19031 Primary osteoarthritis, right wrist: Secondary | ICD-10-CM | POA: Diagnosis not present

## 2021-08-16 ENCOUNTER — Other Ambulatory Visit: Payer: Self-pay | Admitting: Internal Medicine

## 2021-08-16 DIAGNOSIS — S0990XA Unspecified injury of head, initial encounter: Secondary | ICD-10-CM

## 2021-08-24 ENCOUNTER — Telehealth: Payer: Self-pay | Admitting: Cardiology

## 2021-08-24 MED ORDER — AMLODIPINE BESYLATE 5 MG PO TABS: 5.0000 mg | ORAL_TABLET | Freq: Every day | ORAL | 1 refills | Status: DC

## 2021-08-24 NOTE — Telephone Encounter (Signed)
Pt is calling to let Dr. Shari ProwsPemberton and myself know that since she last saw Gillian Shieldsaitlin Walker NP back in October, she has had symptoms of sob, and chest pressure worsen when she wakes in the morning.  She states her pressures have been running high since then with lowest averaging around 132/88 and most days running as high as 180/97.  Pt states Luther ParodyCaitlin had to reduce her lisinopril down to 10 mg po daily on 05/18/21, due to renal function was elevated. Pt is only on lisinopril 10 mg po daily for HTN.  Pt states her symptoms are worse in the AM but end up leveling off when she takes her lisinopril and goes about her day.  Her pressures over the last couple days are so:  1/24- 146/88 1/25-  132/88 1/26- 153/88 Today- 180/97     She reports her rates consistently stay in the 80's. She denies any complaints of headache, sob at rest, blurred vision, pre-syncopal or syncopal episodes.  She states at times she gets dizzy in the morning.   Pt is inquiring what she should do about this.  Informed the pt that I will speak with Dr. Shari ProwsPemberton know, and call her back thereafter with further recommendations.  Pt verbalized understanding and agrees with this plan.

## 2021-08-24 NOTE — Telephone Encounter (Signed)
Spoke with Dr. Shari Prows about this pt.  Per Dr. Shari Prows, pt should continue her lisinopril in the morning time but we will start her on amlodipine 5 mg po daily at bedtime and if pressures still elevated with this addition, then we will increase her amlodipine to taking 10 mg po daily at bedtime. Did inform the pt that it will take about 48-72 hours before noticing a difference with her numbers.   Pt made aware of these recommendations and to continue monitoring her BP/HR and symptoms, and keep [place] posted if we need to increase her dose of amlodipine from 5 mg to 10 mg po daily at bedtime. Pt aware that BP goal should be <130/80.  Confirmed the pharmacy of choice with the pt. Pt verbalized understanding and agrees with this plan.

## 2021-08-24 NOTE — Telephone Encounter (Signed)
° °  Pt c/o Shortness Of Breath: STAT if SOB developed within the last 24 hours or pt is noticeably SOB on the phone  1. Are you currently SOB (can you hear that pt is SOB on the phone)? No   2. How long have you been experiencing SOB? 2 months but getting frequent and worst  3. Are you SOB when sitting or when up moving around? When she wakes up in the morning   4. Are you currently experiencing any other symptoms?  Pt said she's been waking up and having SOB, it takes her an hour before she get her normal breathing, she is getting concerned because it is getting worst and frequent now

## 2021-09-03 ENCOUNTER — Telehealth: Payer: Self-pay | Admitting: Cardiology

## 2021-09-03 MED ORDER — AMLODIPINE BESYLATE 10 MG PO TABS: 10.0000 mg | ORAL_TABLET | Freq: Every day | ORAL | 1 refills | Status: DC

## 2021-09-03 NOTE — Telephone Encounter (Signed)
Completely agree with your plan. If she is still elevated on the amlodipine 10mg  qHS, then we will double her lisinopril to 40mg  daily. Just keep us posted.

## 2021-09-03 NOTE — Telephone Encounter (Signed)
Pt aware that per Dr. Shari Prows, she agrees with plan discussed with her earlier to increase her amlodipine now to 10 mg po QHS, and if her numbers are still elevated a couple days after this increase, to let us know and we can double her lisinopril at that time to 40 mg po daily. Advised her to keep [place] posted.  Pt verbalized understanding and agrees with this plan. Pt was more than gracious for all the assistance provided.

## 2021-09-03 NOTE — Telephone Encounter (Signed)
Pt c/o BP issue: STAT if pt c/o blurred vision, one-sided weakness or slurred speech  1. What are your last 5 BP readings?  09/03/21 188/102 8:30 AM 162/92 9:30 AM  2. Are you having any other symptoms (ex. Dizziness, headache, blurred vision, passed out)? SOB and chest tightness in top part of chest when SOB occurs   3. What is your BP issue? Hypertension that causes SOB. Did have chest tightness this morning when SOB occurred, but is not having now. States sneezing triggers SOB, which was what started SOB and chest tightness this morning. Transferring to triage due to chest tightness in past 24 hrs.

## 2021-09-03 NOTE — Telephone Encounter (Signed)
Pt is calling to follow-up with Dr. Johney Frame and myself from 1/27 call we had with the pt about her pressures and med regimen. Pt states she has been taking her lisinopril in the morning time and started new regimen of amlodipine 5 mg po at bedtime.  She states her pressures are still elevated when she wakes in the morning time, and goes slightly down 1 hour after taking her morning lisinopril.  Pt states this morning her pressure 1 hour after her med administration was 162/92.  She states she was sob and had mild pressure to her chest when waking, so she then took her lisinopril and waited an hour and took her BP which was 162/92 HR-80's.  She states her rates stay in the 80's. She states after she takes her AM med regimen and gets her day going, she does feel so much better and is completely asymptomatic from a cardiac perspective.  She states when we added amlodipine 5 mg to take at bedtime on 1/27, she remembered that Dr. Johney Frame wanted her to keep [place] posted if her pressures are staying above 130/80, so we could increase her night time amlodipine to 10 mg po at bedtime.  Pt is asking should she proceed with this recommendation.  Advised the pt and as indicated below by Dr. Johney Frame on 1/27, she does want her to increase her amlodipine to 10 mg po at bedtime, if pressures aren't at goal.  Did advise the pt to go ahead and do this tonight. Informed the pt that I will call the amlodipine 10 mg tablets into her pharmacy of choice.  Told her to allow 48-72 hours to see a difference, and again we want her BP goal at <130/80.  Advised her to keep [place] posted with this.   ED precautions provided to the pt if symptoms were to persist outside her norm, worsen, or she has a hypertensive crisis.  Informed the pt that I will route this message to Dr. Johney Frame to make her aware that we will increase her bedtime amlodipine to 10 mg starting tonight.  Informed the pt that I will have Dr. Johney Frame to further advise on any  other recommendations outside of increasing her amlodipine to 10 mg at bedtime.  Informed the pt that I will follow-up accordingly thereafter, with any new recommendations she has. Pt verbalized understanding and agrees with this plan.     TELEPHONE ENCOUNTER FROM 08/24/21: Spoke with Dr. Johney Frame about this pt.  Per Dr. Johney Frame, pt should continue her lisinopril in the morning time but we will start her on amlodipine 5 mg po daily at bedtime and if pressures still elevated with this addition, then we will increase her amlodipine to taking 10 mg po daily at bedtime. Did inform the pt that it will take about 48-72 hours before noticing a difference with her numbers.    Pt made aware of these recommendations and to continue monitoring her BP/HR and symptoms, and keep [place] posted if we need to increase her dose of amlodipine from 5 mg to 10 mg po daily at bedtime. Pt aware that BP goal should be <130/80.

## 2021-09-13 ENCOUNTER — Telehealth: Payer: Self-pay | Admitting: Cardiology

## 2021-09-13 DIAGNOSIS — E669 Obesity, unspecified: Secondary | ICD-10-CM | POA: Diagnosis not present

## 2021-09-13 DIAGNOSIS — J309 Allergic rhinitis, unspecified: Secondary | ICD-10-CM | POA: Diagnosis not present

## 2021-09-13 DIAGNOSIS — Z79899 Other long term (current) drug therapy: Secondary | ICD-10-CM | POA: Diagnosis not present

## 2021-09-13 DIAGNOSIS — R0789 Other chest pain: Secondary | ICD-10-CM | POA: Diagnosis not present

## 2021-09-13 DIAGNOSIS — I10 Essential (primary) hypertension: Secondary | ICD-10-CM | POA: Diagnosis not present

## 2021-09-13 DIAGNOSIS — M0600 Rheumatoid arthritis without rheumatoid factor, unspecified site: Secondary | ICD-10-CM | POA: Diagnosis not present

## 2021-09-13 DIAGNOSIS — Z6837 Body mass index (BMI) 37.0-37.9, adult: Secondary | ICD-10-CM | POA: Diagnosis not present

## 2021-09-13 DIAGNOSIS — M6530 Trigger finger, unspecified finger: Secondary | ICD-10-CM | POA: Diagnosis not present

## 2021-09-13 DIAGNOSIS — M5136 Other intervertebral disc degeneration, lumbar region: Secondary | ICD-10-CM | POA: Diagnosis not present

## 2021-09-13 DIAGNOSIS — M150 Primary generalized (osteo)arthritis: Secondary | ICD-10-CM | POA: Diagnosis not present

## 2021-09-13 DIAGNOSIS — R0602 Shortness of breath: Secondary | ICD-10-CM | POA: Diagnosis not present

## 2021-09-13 MED ORDER — LISINOPRIL 20 MG PO TABS: 20.0000 mg | ORAL_TABLET | Freq: Every day | ORAL | 1 refills | Status: DC

## 2021-09-13 NOTE — Telephone Encounter (Signed)
Pt aware that per Dr. Shari Prows, we will now increase her lisinopril from 10 mg po daily to 20 mg po daily and she should continue taking her amlodipine regimen as is at bedtime.  Confirmed the pharmacy of choice with the pt.  Pt states she will use her supply of lisinopril on hand first and call the pharmacy when ready to fill the 20 mg tablets.  Note placed to pharmacy about this.  Advised her to continue monitoring her numbers at home.  Advised her to reduce her salt intake.  Pt verbalized understanding and agrees with this plan.

## 2021-09-13 NOTE — Telephone Encounter (Signed)
Dr. Shari ProwsPemberton, this pt is calling in to report her BP recordings since we last added amlodipine 10 mg po at bedtime to her regimen (refer to phone note from 09/03/21).  Her symptoms and numbers are improving.  She still gets mild dizziness and sob upon waking, but this has improved since we added amlodipine to her bedtime regimen on 09/03/21.  In 09/03/21 phone encounter from you, you advised if she was still running high that we could consider bumping her lisinopril up to 40 mg po daily.  She is currently only taking lisinopril 10 mg po daily.  Please advise if she should continue her regimen as is, or increase her lisinopril to 20 mg or 40 mg po daily?  Will follow-up with the pt accordingly thereafter.

## 2021-09-13 NOTE — Telephone Encounter (Signed)
Her PCP is trying to get her scheduled for a CT calcium score.      Pt c/o BP issue: STAT if pt c/o blurred vision, one-sided weakness or slurred speech  1. What are your last 5 BP readings?  Today 131/77 2/15    134/81 2/14 133/73 2/14    147/81 middle of the night 2/14    159/83 morning 2/12 140/76 morning 2/12 112/73 afternoon 2/11 109/69   2. Are you having any other symptoms (ex. Dizziness, headache, blurred vision, passed out)? no  3. What is your BP issue? The highest it's been 159/83.  The new medication seems to be keeping her BP down.     Pt c/o Shortness Of Breath: STAT if SOB developed within the last 24 hours or pt is noticeably SOB on the phone  1. Are you currently SOB (can you hear that pt is SOB on the phone)? no  2. How long have you been experiencing SOB? About a month  3. Are you SOB when sitting or when up moving around? SOB when she gets up in the morning  4. Are you currently experiencing any other symptoms? A little bit of dizziness

## 2021-09-13 NOTE — Telephone Encounter (Signed)
Going up to 20mg  on lisinopril is perfect.

## 2021-09-14 DIAGNOSIS — M67961 Unspecified disorder of synovium and tendon, right lower leg: Secondary | ICD-10-CM | POA: Diagnosis not present

## 2021-09-14 DIAGNOSIS — M67962 Unspecified disorder of synovium and tendon, left lower leg: Secondary | ICD-10-CM | POA: Diagnosis not present

## 2021-09-14 DIAGNOSIS — L84 Corns and callosities: Secondary | ICD-10-CM | POA: Diagnosis not present

## 2021-09-17 ENCOUNTER — Other Ambulatory Visit: Payer: Self-pay | Admitting: Internal Medicine

## 2021-09-17 DIAGNOSIS — R0789 Other chest pain: Secondary | ICD-10-CM

## 2021-10-30 ENCOUNTER — Other Ambulatory Visit: Payer: Self-pay | Admitting: *Deleted

## 2021-10-30 MED ORDER — AMLODIPINE BESYLATE 10 MG PO TABS: 10.0000 mg | ORAL_TABLET | Freq: Every day | ORAL | 0 refills | Status: DC

## 2021-11-01 ENCOUNTER — Ambulatory Visit
Admission: RE | Admit: 2021-11-01 | Discharge: 2021-11-01 | Disposition: A | Discharge status: Home / Self Care | Payer: Medicare HMO | Source: Ambulatory Visit | Attending: Internal Medicine | Admitting: Internal Medicine

## 2021-11-01 DIAGNOSIS — Z8249 Family history of ischemic heart disease and other diseases of the circulatory system: Secondary | ICD-10-CM | POA: Diagnosis not present

## 2021-11-01 DIAGNOSIS — R0789 Other chest pain: Secondary | ICD-10-CM

## 2021-11-01 DIAGNOSIS — E785 Hyperlipidemia, unspecified: Secondary | ICD-10-CM | POA: Diagnosis not present

## 2021-11-07 ENCOUNTER — Telehealth: Payer: Self-pay | Admitting: Cardiology

## 2021-11-07 NOTE — Telephone Encounter (Signed)
? ?  Pt is calling regarding her calcium score result Dr. Lysle Rubens ordered. She said she has blockage and made an appt with Dr. Johney Frame on 05/09. She asked if Dr. Johney Frame can review the result of her calcium score  ?

## 2021-11-08 NOTE — Telephone Encounter (Signed)
Her Ca score is elevated and in the 92% for age, gender, race matched controls. While this means she does have plaque in her heart arteries, it does not necessarily mean that the plaque is blocking blood flow. Is she having chest pain or shortness of breath on exertion?  ?

## 2021-11-09 ENCOUNTER — Encounter: Payer: Self-pay | Admitting: Internal Medicine

## 2021-11-09 ENCOUNTER — Ambulatory Visit: Payer: Medicare HMO | Admitting: Internal Medicine

## 2021-11-09 VITALS — BP 144/90 | HR 75 | Ht 62.5 in | Wt 216.0 lb

## 2021-11-09 DIAGNOSIS — Z79899 Other long term (current) drug therapy: Secondary | ICD-10-CM | POA: Diagnosis not present

## 2021-11-09 DIAGNOSIS — R42 Dizziness and giddiness: Secondary | ICD-10-CM | POA: Diagnosis not present

## 2021-11-09 DIAGNOSIS — R0600 Dyspnea, unspecified: Secondary | ICD-10-CM

## 2021-11-09 MED ORDER — FUROSEMIDE 20 MG PO TABS: 20.0000 mg | ORAL_TABLET | ORAL | 1 refills | Status: DC

## 2021-11-09 MED ORDER — EZETIMIBE 10 MG PO TABS: 10.0000 mg | ORAL_TABLET | Freq: Every day | ORAL | 1 refills | Status: DC

## 2021-11-09 NOTE — Progress Notes (Signed)
? ? ? ? ?Patient Care Team: ?Georgann HousekeeperHusain, Karrar, MD as PCP - General (Internal Medicine) ?Meriam SpraguePemberton, Heather E, MD as PCP - Cardiology (Cardiology) ? ? ?HPI ? ?Elizabeth Roman is a 71 y.o. female is seen at the request of Dr. Shari ProwsPemberton because of dyspnea upon awakening in the morning. ? ?She has a history of sleep apnea and uses CPAP.  She will sleeps reasonably well, but upon awakening and over the ensuing 15-30 minutes she becomes increasingly dyspneic which then dissipates over the subsequent hour or 2.  This has been a longstanding problem that has come and gone and improved more recently with the introduction of more aggressive antihypertensive therapy but has worsened again over the last couple of months.  She is also noted in this time more abdominal bloating, no peripheral edema and dyspnea on exertion with modest effort.  Denies chest pain.  No associated diaphoresis.  No palpitations. ? ?  ? ? ?DATE TEST EF   ? 5/22 Echo  65-70%   ?4/23 CaScore  674   ?     ? ?Date Cr K Hgb  ?2/20   13.3  ?10/22 1.06 5.1  12.8  ?      ? ? ? ? ?Past Medical History:  ?Diagnosis Date  ? Arthritis   ? High cholesterol   ? Hypertension   ? Left hip pain 09/2019  ? Multiple joint pain 03/2020  ? HANDICAP PLACARD   ? Sleep apnea   ? Thyroid disease   ?   ? ?Surgical History:  ?Past Surgical History:  ?Procedure Laterality Date  ? ABDOMINAL HYSTERECTOMY    ? BREAST BIOPSY    ? BUNIONECTOMY    ? CARPAL TUNNEL RELEASE Left   ? HAND SURGERY Right   ? MEDIAL PARTIAL KNEE REPLACEMENT    ? TONSILLECTOMY    ?  ? ?Home Meds: ?Current Meds  ?Medication Sig  ? amLODipine (NORVASC) 10 MG tablet Take 1 tablet (10 mg total) by mouth at bedtime.  ? Bromelain 250 MG CAPS Take by mouth daily.  ? Coenzyme Q10 (CO Q-10) 200 MG CAPS Take 400 mg by mouth at bedtime.  ? estradiol (ESTRACE) 0.1 MG/GM vaginal cream 2 (two) times a week.  ? ferrous sulfate 325 (65 FE) MG tablet daily.  ? levothyroxine (SYNTHROID, LEVOTHROID) 75 MCG tablet Take 75 mcg by mouth  daily before breakfast.  ? lisinopril (ZESTRIL) 20 MG tablet Take 1 tablet (20 mg total) by mouth daily.  ? MAGNESIUM PO Take 400 mg by mouth daily.  ? naproxen (NAPROSYN) 375 MG tablet Take 1 tablet (375 mg total) by mouth 2 (two) times daily. (Patient taking differently: Take 375 mg by mouth as needed.)  ? pantoprazole (PROTONIX) 40 MG tablet Take 40 mg by mouth daily as needed (indigestion).  ? pravastatin (PRAVACHOL) 10 MG tablet Take 10 mg by mouth daily.  ? Turmeric (CURCUMIN 95 PO) Take 1 tablet by mouth 2 (two) times daily.  ? VITAMIN B COMPLEX-C CAPS Take 1 capsule by mouth 2 (two) times daily.  ? vitamin C (ASCORBIC ACID) 500 MG tablet Take 500 mg by mouth 2 (two) times daily.  ? zinc gluconate 50 MG tablet Take 50 mg by mouth daily.  ? ? ?Allergies:  ?Allergies  ?Allergen Reactions  ? Morphine And Related Anaphylaxis  ? Prednisone Hives and Other (See Comments)  ? Atorvastatin Other (See Comments)  ? Codeine Other (See Comments)  ?  Very ill.  ? Hydroxychloroquine Sulfate   ?  Other reaction(s): no benefit  ? Ivp Dye [Iodinated Contrast Media] Hives  ? Phenobarbital Hives  ? Propoxyphene   ? Sulfa Antibiotics Hives  ? Vicodin [Hydrocodone-Acetaminophen] Other (See Comments)  ?  Very ill.  ? ? ?Social History  ? ?Socioeconomic History  ? Marital status: Married  ?  Spouse name: Not on file  ? Number of children: 8  ? Years of education: Not on file  ? Highest education level: Not on file  ?Occupational History  ? Occupation: RETIRED HAIR DRESSER  ?Tobacco Use  ? Smoking status: Never  ? Smokeless tobacco: Never  ?Vaping Use  ? Vaping Use: Never used  ?Substance and Sexual Activity  ? Alcohol use: Yes  ?  Comment: occ  ? Drug use: No  ? Sexual activity: Not on file  ?Other Topics Concern  ? Not on file  ?Social History Narrative  ? Not on file  ? ?Social Determinants of Health  ? ?Financial Resource Strain: Not on file  ?Food Insecurity: Not on file  ?Transportation Needs: Not on file  ?Physical Activity:  Not on file  ?Stress: Not on file  ?Social Connections: Not on file  ?Intimate Partner Violence: Not on file  ?  ? ?Family History  ?Problem Relation Age of Onset  ? Breast cancer Mother   ? Breast cancer Daughter   ?  ? ?ROS:  Please see the history of present illness.     All other systems reviewed and negative.  ? ? ?Physical Exam: ?Blood pressure (!) 144/90, pulse 75, height 5' 2.5" (1.588 m), weight 216 lb (98 kg), SpO2 94 %. ?General: Well developed, well nourished female in no acute distress. ?Head: Normocephalic, atraumatic, sclera non-icteric, no xanthomas, nares are without discharge. ?EENT: normal  ?Lymph Nodes:  none ?Neck: Negative for carotid bruits. JVD 8 cm +HFR ?Back:without scoliosis kyphosis ?Lungs: Clear bilaterally to auscultation without wheezes, rales, or rhonchi. Breathing is unlabored. ?Heart: RRR with S1 S2. No  murmur . No rubs, or gallops appreciated. ?Abdomen: Soft, non-tender, non-distended with normoactive bowel sounds. No hepatomegaly. No rebound/guarding. No obvious abdominal masses. ?Msk:  Strength and tone appear normal for age. ?Extremities: No clubbing or cyanosis. No  edema.  Distal pedal pulses are 2+ and equal bilaterally. ?Skin: Warm and Dry ?Neuro: Alert and oriented X 3. CN III-XII intact Grossly normal sensory and motor function . ?Psych:  Responds to questions appropriately with a normal affect. ?  ?  ?  ? ?EKG: Sinus at 75 ?Intervals 15/08/36 ? ? ?Assessment and Plan:  ?Dyspnea on exertion ? ?Coronary artery calcification ? ?Hypertension ? ?Obesity ? ?Sleep apnea-treated ? ? ?She has noted progressive dyspnea over recent months.  She notes that when she takes her CPAP off in the morning she is feeling pretty good and I wonder if the physiology is not that the CPAP is maintaining her airway is relatively spared of fluid and with his discontinuation there is volume shifts.  Consistent with this is that she has had more dyspnea on exertion as the day goes on and that the  symptoms abate over an hour or so as she is up and about.  Abdominal bloating and HJR are all consistent with volume overload. ? ?She is noted to have coronary artery calcification so 1 concern is an anginal equivalent.  We will have Dr. Georgena Spurling review her calcium score to see whether postprocessing is possible.  The note describes that it is. ? ?We will begin empiric furosemide 20 mg  daily for 5 days and then every other day.  She is to see Dr. Georgena Spurling in just a few weeks for follow-up. ? ?Her cholesterol is elevated; she has not been able to tolerate other statins so we will add ezetimibe to her pravastatin. ? ?With her coronary artery disease, she is now appropriately treated with aspirin; we will begin this at 81 mg. ? ? ? ?Elizabeth Roman ?  ?

## 2021-11-09 NOTE — Patient Instructions (Signed)
Medication Instructions:  ?Your physician has recommended you make the following change in your medication:  ? ?** Begin Furosemide 20mg  -1 tablet by mouth daily x 5 days then begin 1 tablet by mouth every other day. ? ?Begin Ezetimibe 10mg  -1 tablet by mouth daily. ? ?*If you need a refill on your cardiac medications before your next appointment, please call your pharmacy* ? ? ?Lab Work: ?Pro BNP today ?BMET in 2 weeks ?If you have labs (blood work) drawn today and your tests are completely normal, you will receive your results only by: ?MyChart Message (if you have MyChart) OR ?A paper copy in the mail ?If you have any lab test that is abnormal or we need to change your treatment, we will call you to review the results. ? ? ?Testing/Procedures: ?None ordered. ? ? ? ?Follow-Up: ?At [facility], you and your health needs are our priority.  As part of our continuing mission to provide you with exceptional heart care, we have created designated Provider Care Teams.  These Care Teams include your primary Cardiologist (physician) and Advanced Practice Providers (APPs -  Physician Assistants and Nurse Practitioners) who all work together to provide you with the care you need, when you need it. ? ?We recommend signing up for the patient portal called "MyChart".  Sign up information is provided on this After Visit Summary.  MyChart is used to connect with patients for Virtual Visits (Telemedicine).  Patients are able to view lab/test results, encounter notes, upcoming appointments, etc.  Non-urgent messages can be sent to your provider as well.   ?To learn more about what you can do with MyChart, go to [email or web address].   ? ?Your next appointment:   ?Follow up with Dr Shari Prows as scheduled ? ?Important Information About Sugar ? ? ? ? ?  ?

## 2021-11-09 NOTE — Telephone Encounter (Signed)
Advised the patient that Dr. Johney Frame would like her to be seen by the DOD today made appt at 11 am. Patient verbalized understanding and agreement.  ?

## 2021-11-09 NOTE — Telephone Encounter (Addendum)
Patient said she is having trouble with breathing and it feels tight "like she can't take a deep breath," when she first wakes up for ~ 1 hour. States she didn't have it for a while after they fixed her high blood pressure. She did not have symptoms for about 5 weeks this was at the end of February when it was better. Started back ~ beginning of April. Prior to the symptoms going away they first started around November with shortness of breathing and high BP. She does have some shortness of breath sometimes during the day but this is not the same as in the morning, "in the morning it feels like something in sitting on my chest and won't let me take a deep breath." Also states sometime she has a sharp pain in her chest that radiates up into her jaw. She has not missed any medications, states she is not having any symptoms today and her blood pressure has not been a problem and "running good."  ED precautions given. Patient verbalized understand and agreement.  ?

## 2021-11-10 LAB — PRO B NATRIURETIC PEPTIDE: NT-Pro BNP: 114 pg/mL (ref 0–301)

## 2021-11-12 ENCOUNTER — Encounter: Payer: Self-pay | Admitting: Internal Medicine

## 2021-11-15 ENCOUNTER — Encounter: Payer: Self-pay | Admitting: Internal Medicine

## 2021-11-16 NOTE — Progress Notes (Deleted)
?Cardiology Office Note:   ? ?Date:  11/16/2021  ? ?ID:  Elizabeth SackDenise Londo, DOB 11/11/1950, MRN [id number] ? ?PCP:  Georgann HousekeeperHusain, Karrar, MD ?  ?[facility]  ?Cardiologist:  Meriam SpragueHeather E Presley Gora, MD  ?Advanced Practice Provider:  No care team member to display ?Electrophysiologist:  None  ? ? ?Referring MD: Georgann HousekeeperHusain, Karrar, MD  ? ? ?History of Present Illness:   ? ?Elizabeth Roman is a 71 y.o. female with a hx of HTN, HLD and OSA who returns to clinic for follow-up. ? ?Was seen in the ED 07/06/20 for episode of lightheadedness and pre-syncope while shopping. UA there positive and patient had UTI and patient recently completed course of ABX. She was started on keflex in the ED. BG was also low at 61 which improved with eating. ECG with NSR, low voltage, no ischemia. She clinically improved and was sent home.  ? ?Was seen in clinic on 09/19/20 where she was having episodes of lightheadedness. We placed a cardiac monitor which showed 2 episodes of NS SVT but no significant arrhythmias or ectopy. She is also scheduled for TTE. ? ?Seen in clinic on 10/2020 where she reported worsening extremity edema with duplex negative for DVT. Subsequent echocardiogram 12/19/2020 with normal LVEF, no R WMA, determinate diastolic parameters, mild MR. ? ?Was seen as an urgent visit on 11/09/21 by Dr. Graciela HusbandsKlein for worsening dyspnea on exertion. She was started on lasix 20mg  PO x5 days.  ? ?Today, *** ? ?Family history: Father with CAD (died of MI at 966), paternal uncles and aunts with CAD.  ? ? ?Past Medical History:  ?Diagnosis Date  ? Arthritis   ? High cholesterol   ? Hypertension   ? Left hip pain 09/2019  ? Multiple joint pain 03/2020  ? HANDICAP PLACARD   ? Sleep apnea   ? Thyroid disease   ? ? ?Past Surgical History:  ?Procedure Laterality Date  ? ABDOMINAL HYSTERECTOMY    ? BREAST BIOPSY    ? BUNIONECTOMY    ? CARPAL TUNNEL RELEASE Left   ? HAND SURGERY Right   ? MEDIAL PARTIAL KNEE REPLACEMENT    ? TONSILLECTOMY    ? ? ?Current  Medications: ?No outpatient medications have been marked as taking for the 11/20/21 encounter (Appointment) with Meriam SpraguePemberton, Marcellus Pulliam E, MD.  ?  ? ?Allergies:   Morphine and related, Prednisone, Atorvastatin, Codeine, Hydroxychloroquine sulfate, Ivp dye [iodinated contrast media], Phenobarbital, Propoxyphene, Sulfa antibiotics, and Vicodin [hydrocodone-acetaminophen]  ? ?Social History  ? ?Socioeconomic History  ? Marital status: Married  ?  Spouse name: Not on file  ? Number of children: 8  ? Years of education: Not on file  ? Highest education level: Not on file  ?Occupational History  ? Occupation: RETIRED HAIR DRESSER  ?Tobacco Use  ? Smoking status: Never  ? Smokeless tobacco: Never  ?Vaping Use  ? Vaping Use: Never used  ?Substance and Sexual Activity  ? Alcohol use: Yes  ?  Comment: occ  ? Drug use: No  ? Sexual activity: Not on file  ?Other Topics Concern  ? Not on file  ?Social History Narrative  ? Not on file  ? ?Social Determinants of Health  ? ?Financial Resource Strain: Not on file  ?Food Insecurity: Not on file  ?Transportation Needs: Not on file  ?Physical Activity: Not on file  ?Stress: Not on file  ?Social Connections: Not on file  ?  ? ?Family History: ?The patient's family history includes Breast cancer in her  daughter and mother. ? ?ROS:   ?Please see the history of present illness.    ?Review of Systems  ?Constitutional:  Negative for chills and fever.  ?HENT:  Negative for hearing loss and sore throat.   ?Eyes:  Negative for blurred vision and redness.  ?Respiratory:  Positive for shortness of breath.   ?Cardiovascular:  Negative for chest pain, palpitations, orthopnea, claudication, leg swelling and PND.  ?Gastrointestinal:  Negative for nausea and vomiting.  ?Genitourinary:  Negative for frequency.  ?Musculoskeletal:  Positive for joint pain. Negative for falls.  ?Neurological:  Positive for dizziness. Negative for loss of consciousness.  ?Endo/Heme/Allergies:  Positive for environmental  allergies.  ?Psychiatric/Behavioral:  Negative for substance abuse.   ? ?EKGs/Labs/Other Studies Reviewed:   ? ?The following studies were reviewed today: ?TTE 05/2022L ?IMPRESSIONS  ? ? ? 1. Left ventricular ejection fraction, by estimation, is 65 to 70%. The  ?left ventricle has normal function. The left ventricle has no regional  ?wall motion abnormalities. Left ventricular diastolic parameters are  ?indeterminate.  ? 2. Right ventricular systolic function is normal. The right ventricular  ?size is normal.  ? 3. Mild mitral valve regurgitation.  ? 4. The aortic valve is tricuspid. Aortic valve regurgitation is not  ?visualized. Mild aortic valve sclerosis is present, with no evidence of  ?aortic valve stenosis.  ? 5. The inferior vena cava is normal in size with greater than 50%  ?respiratory variability, suggesting right atrial pressure of 3 mmHg.  ?Cardiac Monitor 10/03/20: ?Patch wear time was 6 days and 22 hours ?Predominant rhythm was NSR with average HR 85bpm (ranging from 65-169bpm) ?2 episodes of non-sustained SVT with longest lasting 6 beats at rate of 156bpm and fastest lasting 4 beats at rate 169bpm (not associated with patient triggered event) ?Rare SVEs, rare PVCs <1% ?No AFib, pauses or sustained arrhythmias ? ?EKG:   ?11/15/20: EKG was not ordered today. ?09/19/20: NSR with HR 89 ? ?Recent Labs: ?05/25/2021: BUN 24; Creatinine, Ser 1.06; Potassium 5.1; Sodium 138 ?11/09/2021: NT-Pro BNP 114  ?Recent Lipid Panel ?No results found for: CHOL, TRIG, HDL, CHOLHDL, VLDL, LDLCALC, LDLDIRECT ? ? ? ?Physical Exam:   ? ?VS:  There were no vitals taken for this visit.   ? ?Wt Readings from Last 3 Encounters:  ?11/09/21 216 lb (98 kg)  ?05/04/21 220 lb 3.2 oz (99.9 kg)  ?11/15/20 213 lb 9.6 oz (96.9 kg)  ?  ? ?GEN:  Well nourished, well developed in no acute distress ?HEENT: Normal ?NECK: No JVD; No carotid bruits ?CARDIAC: RRR, no murmurs, rubs, gallops ?RESPIRATORY:  Clear to auscultation without rales,  wheezing or rhonchi  ?ABDOMEN: Soft, non-tender, non-distended ?MUSCULOSKELETAL:  Left lower extremity with focal swelling and purple discoloration with varicosities. No calf tenderness. RLE normal with pedal varicosities. ?SKIN: Warm and dry ?NEUROLOGIC:  Alert and oriented x 3 ?PSYCHIATRIC:  Normal affect  ? ?ASSESSMENT:   ? ?No diagnosis found. ? ?PLAN:   ? ?In order of problems listed above: ? ?#CAD: ?Ca score 672 which was the 92% for age, gender, race matched controls. TTE in 11/2020 with normal BiV function.  ?-? Check PET ?-Continue ASA  daily ?-Continue prava  daily and zetia  daily ? ?#LE Edema: ?TTE with normal BiV function and no significant valve disease. LE doppler with no evidence of DVT. Improved with lasix.  ?-Resolved ? ?#HTN: ?-Continue lisinopril  daily ?-Patient is going to log blood pressures for [place] to review ? ?#HLD: ?Managed by PCP. ?-Continue  pravastatin  daily ?-Continue zetia  daily ? ? ?Medication Adjustments/Labs and Tests Ordered: ?Current medicines are reviewed at length with the patient today.  Concerns regarding medicines are outlined above.  ?No orders of the defined types were placed in this encounter. ? ?No orders of the defined types were placed in this encounter. ? ? ?There are no Patient Instructions on file for this visit. ?  ? ?Follow-up in 6 months. ? ?I,Mathew Stumpf,acting as a Neurosurgeon for Meriam Sprague, MD.,have documented all relevant documentation on the behalf of Meriam Sprague, MD,as directed by  Meriam Sprague, MD while in the presence of Meriam Sprague, MD. ? ?I, Meriam Sprague, MD, have reviewed all documentation for this visit. The documentation on 11/16/21 for the exam, diagnosis, procedures, and orders are all accurate and complete. ? ?Signed, ?Meriam Sprague, MD  ?11/16/2021 3:08 PM    ?[facility] ?

## 2021-11-20 ENCOUNTER — Ambulatory Visit: Payer: Medicare HMO | Admitting: Cardiology

## 2021-11-20 ENCOUNTER — Encounter: Payer: Self-pay | Admitting: Cardiology

## 2021-11-20 VITALS — BP 132/76 | HR 80 | Ht 62.5 in | Wt 216.0 lb

## 2021-11-20 DIAGNOSIS — E782 Mixed hyperlipidemia: Secondary | ICD-10-CM

## 2021-11-20 DIAGNOSIS — G4733 Obstructive sleep apnea (adult) (pediatric): Secondary | ICD-10-CM

## 2021-11-20 DIAGNOSIS — I25119 Atherosclerotic heart disease of native coronary artery with unspecified angina pectoris: Secondary | ICD-10-CM | POA: Diagnosis not present

## 2021-11-20 DIAGNOSIS — R079 Chest pain, unspecified: Secondary | ICD-10-CM

## 2021-11-20 DIAGNOSIS — I10 Essential (primary) hypertension: Secondary | ICD-10-CM

## 2021-11-20 DIAGNOSIS — R0602 Shortness of breath: Secondary | ICD-10-CM

## 2021-11-20 NOTE — Progress Notes (Signed)
?Cardiology Office Note:   ? ?Date:  11/20/2021  ? ?ID:  Elizabeth SackDenise Danziger, DOB 04/08/1951, MRN [id number] ? ?PCP:  Georgann HousekeeperHusain, Karrar, MD ?  ?[facility]  ?Cardiologist:  Meriam SpragueHeather E Nemesio Castrillon, MD  ?Advanced Practice Provider:  No care team member to display ?Electrophysiologist:  None  ? ? ?Referring MD: Georgann HousekeeperHusain, Karrar, MD  ? ? ?History of Present Illness:   ? ?Elizabeth Roman is a 71 y.o. female with a hx of HTN, HLD and OSA who returns to clinic for follow-up. ? ?Was seen in the ED 07/06/20 for episode of lightheadedness and pre-syncope while shopping. UA there positive and patient had UTI and patient recently completed course of ABX. She was started on keflex in the ED. BG was also low at 61 which improved with eating. ECG with NSR, low voltage, no ischemia. She clinically improved and was sent home.  ? ?Was seen in clinic on 09/19/20 where she was having episodes of lightheadedness. We placed a cardiac monitor which showed 2 episodes of NS SVT but no significant arrhythmias or ectopy. She is also scheduled for TTE. ? ?Seen in clinic on 10/2020 where she reported worsening extremity edema with duplex negative for DVT. Subsequent echocardiogram 12/19/2020 with normal LVEF, no R WMA, determinate diastolic parameters, mild MR. ? ?Was seen as an urgent visit on 11/09/21 by Dr. Graciela HusbandsKlein for worsening dyspnea on exertion. She was started on lasix 20mg  PO x5 days.  ? ?Calcium score from 11/01/21 was 674 which is at 92nd percentile for age and sex. Atherosclerosis  of the thoracic aorta, calcification of mitral valve annulus, and small hiatal hernia here also found.  ? ?Today, she is not doing the best. Her dyspnea has been progressively worsening. She sleeps with a CPAP but denies orthopnea. Her breathing is labored in the morning and improves briefly but, by noon, she becomes short of breath again. Symptoms worsened with activity and improve slightly with rest. She states that she "feels like she is choking." In the  past, the SOB would improve after about an hour. However, recently, the shortness of breath lasts longer. The shortness of breath also has associated headaches. She denies chest pressure, palpitations, PND, orthopnea, or leg swelling.  ? ?The shortness of breath occurred in November but only started worsening in the past month. No changes in symptoms with lasix. Her blood pressure at home has been normal and stable. She endorses severe allergies. Nightly, she takes an Careers adviserAllegra. Of note, symptoms [place] to [place]. ? ? ?She recently started taking Zetia.  ? ?Past Medical History:  ?Diagnosis Date  ? Arthritis   ? High cholesterol   ? Hypertension   ? Left hip pain 09/2019  ? Multiple joint pain 03/2020  ? HANDICAP PLACARD   ? Sleep apnea   ? Thyroid disease   ? ? ?Past Surgical History:  ?Procedure Laterality Date  ? ABDOMINAL HYSTERECTOMY    ? BREAST BIOPSY    ? BUNIONECTOMY    ? CARPAL TUNNEL RELEASE Left   ? HAND SURGERY Right   ? MEDIAL PARTIAL KNEE REPLACEMENT    ? TONSILLECTOMY    ? ? ?Current Medications: ?Current Meds  ?Medication Sig  ? amLODipine (NORVASC) 10 MG tablet Take 1 tablet (10 mg total) by mouth at bedtime.  ? aspirin EC 81 MG tablet Take 81 mg by mouth daily. Swallow whole.  ? Bromelain 250 MG CAPS Take by mouth daily.  ? Coenzyme Q10 (CO Q-10) 200  MG CAPS Take 400 mg by mouth at bedtime.  ? Cranberry 500 MG CAPS Take by mouth. 2 times per day  ? ezetimibe (ZETIA) 10 MG tablet Take 1 tablet (10 mg total) by mouth daily.  ? ferrous sulfate 325 (65 FE) MG tablet daily.  ? furosemide (LASIX) 20 MG tablet Take 1 tablet (20 mg total) by mouth every other day. Take 1 tablet by mouth daily x 5 days then 1 tablet by mouth every other day.  ? levothyroxine (SYNTHROID, LEVOTHROID) 75 MCG tablet Take 75 mcg by mouth daily before breakfast.  ? lisinopril (ZESTRIL) 20 MG tablet Take 1 tablet (20 mg total) by mouth daily.  ? MAGNESIUM PO Take 400 mg by mouth daily.  ? Multiple  Vitamins-Minerals (ANTIOXIDANT PO) Take by mouth. 3 cap fulls daily  ? OVER THE COUNTER MEDICATION Stool softener 250 mg at bedtime  ? OVER THE COUNTER MEDICATION Glucosamine  daily  ? pantoprazole (PROTONIX) 40 MG tablet Take 40 mg by mouth daily as needed (indigestion).  ? pravastatin (PRAVACHOL) 10 MG tablet Take 10 mg by mouth daily.  ? Turmeric (CURCUMIN 95 PO) Take 1 tablet by mouth 2 (two) times daily.  ? VITAMIN B COMPLEX-C CAPS Take 1 capsule by mouth 2 (two) times daily.  ? vitamin C (ASCORBIC ACID) 500 MG tablet Take 500 mg by mouth 2 (two) times daily.  ? zinc gluconate 50 MG tablet Take 50 mg by mouth daily.  ?  ? ?Allergies:   Morphine and related, Prednisone, Atorvastatin, Codeine, Hydroxychloroquine sulfate, Ivp dye [iodinated contrast media], Phenobarbital, Propoxyphene, Sulfa antibiotics, and Vicodin [hydrocodone-acetaminophen]  ? ?Social History  ? ?Socioeconomic History  ? Marital status: Married  ?  Spouse name: Not on file  ? Number of children: 8  ? Years of education: Not on file  ? Highest education level: Not on file  ?Occupational History  ? Occupation: RETIRED HAIR DRESSER  ?Tobacco Use  ? Smoking status: Never  ? Smokeless tobacco: Never  ?Vaping Use  ? Vaping Use: Never used  ?Substance and Sexual Activity  ? Alcohol use: Yes  ?  Comment: occ  ? Drug use: No  ? Sexual activity: Not on file  ?Other Topics Concern  ? Not on file  ?Social History Narrative  ? Not on file  ? ?Social Determinants of Health  ? ?Financial Resource Strain: Not on file  ?Food Insecurity: Not on file  ?Transportation Needs: Not on file  ?Physical Activity: Not on file  ?Stress: Not on file  ?Social Connections: Not on file  ?  ? ?Family History: ?The patient's family history includes Breast cancer in her daughter and mother. ?Family history: Father with CAD (died of MI at 2), paternal uncles and aunts with CAD.  ? ?ROS:   ?Please see the history of present illness.    ?Review of Systems  ?Constitutional:   Negative for chills and fever.  ?HENT:  Negative for hearing loss and sinus pain.   ?Eyes:  Negative for blurred vision and redness.  ?Respiratory:  Positive for shortness of breath.   ?Cardiovascular:  Negative for chest pain, palpitations, orthopnea, claudication, leg swelling and PND.  ?Gastrointestinal:  Negative for nausea and vomiting.  ?Genitourinary:  Negative for frequency.  ?Musculoskeletal:  Positive for neck pain (R-sided). Negative for falls and joint pain.  ?Neurological:  Positive for headaches. Negative for dizziness and loss of consciousness.  ?Endo/Heme/Allergies:  Positive for environmental allergies.  ?Psychiatric/Behavioral:  Negative for substance abuse.   ? ?  EKGs/Labs/Other Studies Reviewed:   ? ?The following studies were reviewed today: ?CT Cardiac Score 11/01/21 ?IMPRESSION: ?1. Coronary calcium score of 674 is at the 92nd percentile for the ?patient's age, sex and race. ?2. Atherosclerosis of the thoracic aorta. ?3. Calcifications of the mitral valve annulus. ?4. Small hiatal hernia. ? ?TTE 11/2020: ?IMPRESSIONS  ? ? ? 1. Left ventricular ejection fraction, by estimation, is 65 to 70%. The  ?left ventricle has normal function. The left ventricle has no regional  ?wall motion abnormalities. Left ventricular diastolic parameters are  ?indeterminate.  ? 2. Right ventricular systolic function is normal. The right ventricular  ?size is normal.  ? 3. Mild mitral valve regurgitation.  ? 4. The aortic valve is tricuspid. Aortic valve regurgitation is not  ?visualized. Mild aortic valve sclerosis is present, with no evidence of  ?aortic valve stenosis.  ? 5. The inferior vena cava is normal in size with greater than 50%  ?respiratory variability, suggesting right atrial pressure of 3 mmHg.  ?Cardiac Monitor 10/03/20: ?Patch wear time was 6 days and 22 hours ?Predominant rhythm was NSR with average HR 85bpm (ranging from 65-169bpm) ?2 episodes of non-sustained SVT with longest lasting 6 beats at rate  of 156bpm and fastest lasting 4 beats at rate 169bpm (not associated with patient triggered event) ?Rare SVEs, rare PVCs <1% ?No AFib, pauses or sustained arrhythmias ? ?EKG:  EKG was not ordered today ?09/19/20: NSR wi

## 2021-11-20 NOTE — Patient Instructions (Signed)
Medication Instructions:  ? ?Your physician recommends that you continue on your current medications as directed. Please refer to the Current Medication list given to you today. ? ?*If you need a refill on your cardiac medications before your next appointment, please call your pharmacy* ? ? ?You have been referred to PULMONOLOGY FOR ONGOING SHORTNESS OF BREATH ? ? ?Lab Work: ? ?KEEP AS SCHEDULED FOR 11/26/21 ? ?If you have labs (blood work) drawn today and your tests are completely normal, you will receive your results only by: ?MyChart Message (if you have MyChart) OR ?A paper copy in the mail ?If you have any lab test that is abnormal or we need to change your treatment, we will call you to review the results. ? ? ?Testing/Procedures: ? ?How to Prepare for Your Cardiac PET/CT Stress Test: ? ?1. Please do not take these medications before your test:  ? ?Medications that may interfere with the cardiac pharmacological stress agent (ex. nitrates or beta-blockers) the day of the exam. ? ?Your remaining medications may be taken with water. ? ?2. Nothing to eat or drink, except water, 3 hours prior to arrival time.   ?NO caffeine/decaffeinated products, or chocolate 12 hours prior to arrival. ? ?3. NO perfume, cologne or lotion ? ?4. Total time is 1 to 2 hours; you may want to bring reading material for the waiting time. ? ?5. Please [facility] 60 minutes early for your test. ? [address] ? [place], [place] [address] ? ? ?IF YOU THINK YOU MAY BE PREGNANT, OR ARE NURSING PLEASE INFORM THE TECHNOLOGIST. ? ?In preparation for your appointment, medication and supplies will be purchased.  Appointment availability is limited, so if you need to cancel or reschedule, please call the Radiology Department at [phone]  24 hours in advance to avoid a cancellation fee of $100.00 ? ?What to Expect After you Arrive: ? ?Once you arrive and check in for your appointment, you will  be taken to a preparation room within the Radiology Department.  A technologist or Nurse will obtain your medical history, verify that you are correctly prepped for the exam, and explain the procedure.  Afterwards,  an IV will be started in your arm and electrodes will be placed on your skin for EKG monitoring during the stress portion of the exam. Then you will be escorted to the PET/CT scanner.  There, staff will get you positioned on the scanner and obtain a blood pressure and EKG.  During the exam, you will continue to be connected to the EKG and blood pressure machines.  A small, safe amount of a radioactive tracer will be injected in your IV to obtain a series of pictures of your heart along with an injection of a stress agent.   ? ?After your Exam: ? ?It is recommended that you eat a meal and drink a caffeinated beverage to counter act any effects of the stress agent.  Drink plenty of fluids for the remainder of the day and urinate frequently for the first couple of hours after the exam.  Your doctor will inform you of your test results within 7-10 business days. ? ?For questions about your test or how to prepare for your test, please call: ?Rockwell AlexandriaSara Wallace, Cardiac Imaging Nurse Navigator  ?Larey BrickMerle Prescott, Cardiac Imaging Nurse Navigator ?Office: [phone] ? ? ? ?Follow-Up: ?At [facility], you and your health needs are our priority.  As part of our continuing mission to provide  you with exceptional heart care, we have created designated Provider Care Teams.  These Care Teams include your primary Cardiologist (physician) and Advanced Practice Providers (APPs -  Physician Assistants and Nurse Practitioners) who all work together to provide you with the care you need, when you need it. ? ?We recommend signing up for the patient portal called "MyChart".  Sign up information is provided on this After Visit Summary.  MyChart is used to connect with patients for Virtual Visits (Telemedicine).  Patients are able  to view lab/test results, encounter notes, upcoming appointments, etc.  Non-urgent messages can be sent to your provider as well.   ?To learn more about what you can do with MyChart, go to [email or web address].   ? ?Your next appointment:   ?6 month(s) ? ?The format for your next appointment:   ?In Person ? ?Provider:   ?Meriam Sprague, MD { ? ? ?Important Information About Sugar ? ? ? ? ? ? ?

## 2021-11-26 ENCOUNTER — Other Ambulatory Visit: Payer: Medicare HMO | Admitting: *Deleted

## 2021-11-26 DIAGNOSIS — Z79899 Other long term (current) drug therapy: Secondary | ICD-10-CM | POA: Diagnosis not present

## 2021-11-26 DIAGNOSIS — R0600 Dyspnea, unspecified: Secondary | ICD-10-CM

## 2021-11-26 DIAGNOSIS — R42 Dizziness and giddiness: Secondary | ICD-10-CM

## 2021-11-27 LAB — BASIC METABOLIC PANEL
BUN/Creatinine Ratio: 28 (ref 12–28)
BUN: 29 mg/dL — ABNORMAL HIGH (ref 8–27)
CO2: 20 mmol/L (ref 20–29)
Calcium: 9.8 mg/dL (ref 8.7–10.3)
Chloride: 104 mmol/L (ref 96–106)
Creatinine, Ser: 1.03 mg/dL — ABNORMAL HIGH (ref 0.57–1.00)
Glucose: 100 mg/dL — ABNORMAL HIGH (ref 70–99)
Potassium: 4.9 mmol/L (ref 3.5–5.2)
Sodium: 138 mmol/L (ref 134–144)
eGFR: 57 mL/min/{1.73_m2} — ABNORMAL LOW (ref >59)

## 2021-11-30 ENCOUNTER — Encounter (INDEPENDENT_AMBULATORY_CARE_PROVIDER_SITE_OTHER): Payer: Medicare HMO | Admitting: Cardiology

## 2021-11-30 ENCOUNTER — Encounter: Payer: Self-pay | Admitting: Cardiology

## 2021-11-30 DIAGNOSIS — R0602 Shortness of breath: Secondary | ICD-10-CM | POA: Diagnosis not present

## 2021-11-30 MED ORDER — ALBUTEROL SULFATE HFA 108 (90 BASE) MCG/ACT IN AERS: 2.0000 | INHALATION_SPRAY | Freq: Four times a day (QID) | RESPIRATORY_TRACT | 2 refills | Status: DC | PRN

## 2021-11-30 NOTE — Progress Notes (Deleted)
?  Thank you for your message seeking medical advice.* My assessment and recommendation are as follows: ? ?Hi Ms. Elizabeth Roman, ? ?I will send you in an albuterol inhaler to see if it helps your symptoms until you see the Pulmonary doctor. This is only meant to be used when your symptoms are acting up. You can use it up to every 6 hours. We will also follow-up on your PET stress test to make sure there is no evidence of decreased blood flow to your heart arteries that may be causing symptoms.  ? ?Hope this helps!  ? ?Sincerely,  ?Freada Bergeron, MD  ? ? ?*This exchange required the expertise of a doctor, nurse practitioner, physician assistant, optometrist or certified nurse midwife and qualifies as a Medical Advice Message, please visit [email or web address] for more details. [facility] will bill your insurance on your behalf; copays and deductibles may apply. Questions? Reply to this message.   ? ? ?

## 2021-11-30 NOTE — Progress Notes (Signed)
Please see the MyChart message reply(ies) for my assessment and plan.    This patient gave consent for this Medical Advice Message and is aware that it may result in a bill to their insurance company, as well as the possibility of receiving a bill for a co-payment or deductible. They are an established patient, but are not seeking medical advice exclusively about a problem treated during an in person or video visit in the last seven days. I did not recommend an in person or video visit within seven days of my reply.    I spent a total of 7 minutes cumulative time within 7 days through MyChart messaging.  Dreyton Roessner E Desaray Marschner, MD   

## 2021-12-03 DIAGNOSIS — L84 Corns and callosities: Secondary | ICD-10-CM | POA: Diagnosis not present

## 2021-12-03 DIAGNOSIS — M67961 Unspecified disorder of synovium and tendon, right lower leg: Secondary | ICD-10-CM | POA: Diagnosis not present

## 2021-12-03 DIAGNOSIS — M67962 Unspecified disorder of synovium and tendon, left lower leg: Secondary | ICD-10-CM | POA: Diagnosis not present

## 2021-12-04 ENCOUNTER — Ambulatory Visit: Payer: Medicare HMO | Admitting: Cardiology

## 2021-12-10 NOTE — Progress Notes (Signed)
? ?Synopsis: Referred for dyspnea by Wenda Low, MD ? ?Subjective:  ? ?PATIENT ID: Elizabeth Roman GENDER: female DOB: 1950/09/23, MRN: [id number] ? ?Chief Complaint  ?Patient presents with  ? Pulmonary Consult  ?  Referred by Dr. Gwyndolyn Kaufman.  Pt c/o SOB since Oct 2022. She states she feels SOB when she first gets up in the am. She states sometimes she "feels like choking".    ? ?71yF with history of HTN, OSA, RA followed by Dr. Trudie Reed referred from cardiology for dyspnea.  ? ?Dyspneic since 04/2021, worst when she first wakes up, sometimes feels 'like choking' sensation. Greater dyspnea with activity. Mentions history of severe allergy, better when she heads back to [place] than in [place]. ? ?The last bad attack that she has was a week ago where she feels like someone is sitting at top of her chest choking her. She hasn't yet tried albuterol inhaler for it during a bad episode. She has never been on daily inhaler before. Her hoarseness has worsened over last couple of months.  ? ?She does have chronic cough but it's not significantly bothersome. She describes it as a frequent throat clearing kind of cough.  ? ?She has never seen an allergist. She does have some sinus congestion and occasional postnasal drainage. Does use protonix intermittently for GERD.  ? ?She has seen ENT years ago who ordered CPAP for her. She is unsure who she sees for her CPAP now.  ? ?She never sleeps without CPAP.  ? ?Symptoms did not improve with trial of lasix. She is on ACEi and dose was recently increased. ? ?Cardiology has ordered stress test.  ? ?Otherwise pertinent review of systems is negative. ? ?She has no family history of lung disease other than mom who had asbestosis (she was Oncologist)  ? ?She worked as a Theme park manager for many years. She is from [place] She lives in [place] now.  ? ?Past Medical History:  ?Diagnosis Date  ? Arthritis   ? High cholesterol   ? Hypertension   ? Left hip pain 09/2019  ?  Multiple joint pain 03/2020  ? HANDICAP PLACARD   ? Sleep apnea   ? Thyroid disease   ?  ? ?Family History  ?Problem Relation Age of Onset  ? Breast cancer Mother   ? Breast cancer Daughter   ?  ? ?Past Surgical History:  ?Procedure Laterality Date  ? ABDOMINAL HYSTERECTOMY    ? BREAST BIOPSY    ? BUNIONECTOMY    ? CARPAL TUNNEL RELEASE Left   ? HAND SURGERY Right   ? MEDIAL PARTIAL KNEE REPLACEMENT    ? TONSILLECTOMY    ? ? ?Social History  ? ?Socioeconomic History  ? Marital status: Married  ?  Spouse name: Not on file  ? Number of children: 8  ? Years of education: Not on file  ? Highest education level: Not on file  ?Occupational History  ? Occupation: RETIRED HAIR DRESSER  ?Tobacco Use  ? Smoking status: Never  ? Smokeless tobacco: Never  ?Vaping Use  ? Vaping Use: Never used  ?Substance and Sexual Activity  ? Alcohol use: Yes  ?  Comment: occ  ? Drug use: No  ? Sexual activity: Not on file  ?Other Topics Concern  ? Not on file  ?Social History Narrative  ? Not on file  ? ?Social Determinants of Health  ? ?Financial Resource Strain: Not on file  ?Food Insecurity: Not on file  ?  Transportation Needs: Not on file  ?Physical Activity: Not on file  ?Stress: Not on file  ?Social Connections: Not on file  ?Intimate Partner Violence: Not on file  ?  ? ?Allergies  ?Allergen Reactions  ? Morphine And Related Anaphylaxis  ? Prednisone Hives and Other (See Comments)  ? Atorvastatin Other (See Comments)  ? Codeine Other (See Comments)  ?  Very ill.  ? Hydroxychloroquine Sulfate   ?  Other reaction(s): no benefit  ? Ivp Dye [Iodinated Contrast Media] Hives  ? Phenobarbital Hives  ? Propoxyphene   ? Sulfa Antibiotics Hives  ? Vicodin [Hydrocodone-Acetaminophen] Other (See Comments)  ?  Very ill.  ?  ? ?Outpatient Medications Prior to Visit  ?Medication Sig Dispense Refill  ? albuterol (VENTOLIN HFA) 108 (90 Base) MCG/ACT inhaler Inhale 2 puffs into the lungs every 6 (six) hours as needed for wheezing or shortness of breath. 8  g 2  ? amLODipine (NORVASC) 10 MG tablet Take 1 tablet (10 mg total) by mouth at bedtime. 30 tablet 0  ? aspirin EC 81 MG tablet Take 81 mg by mouth daily. Swallow whole.    ? Bromelain 250 MG CAPS Take by mouth daily.    ? Coenzyme Q10 (CO Q-10) 200 MG CAPS Take 400 mg by mouth at bedtime.    ? Cranberry 500 MG CAPS Take by mouth. 2 times per day    ? estradiol (ESTRACE) 0.1 MG/GM vaginal cream 2 (two) times a week.    ? ezetimibe (ZETIA) 10 MG tablet Take 1 tablet (10 mg total) by mouth daily. 90 tablet 1  ? ferrous sulfate 325 (65 FE) MG tablet daily.    ? furosemide (LASIX) 20 MG tablet Take 1 tablet (20 mg total) by mouth every other day. Take 1 tablet by mouth daily x 5 days then 1 tablet by mouth every other day. 45 tablet 1  ? levothyroxine (SYNTHROID, LEVOTHROID) 75 MCG tablet Take 75 mcg by mouth daily before breakfast.    ? lisinopril (ZESTRIL) 20 MG tablet Take 1 tablet (20 mg total) by mouth daily. 90 tablet 1  ? MAGNESIUM PO Take 400 mg by mouth daily.    ? Multiple Vitamins-Minerals (ANTIOXIDANT PO) Take by mouth. 3 cap fulls daily    ? naproxen (NAPROSYN) 375 MG tablet Take 1 tablet (375 mg total) by mouth 2 (two) times daily. (Patient taking differently: Take 375 mg by mouth as needed.) 20 tablet 0  ? OVER THE COUNTER MEDICATION Stool softener 250 mg at bedtime    ? OVER THE COUNTER MEDICATION Glucosamine 2000mg  daily    ? pantoprazole (PROTONIX) 40 MG tablet Take 40 mg by mouth daily as needed (indigestion).    ? pravastatin (PRAVACHOL) 10 MG tablet Take 10 mg by mouth daily.    ? Turmeric (CURCUMIN 95 PO) Take 1 tablet by mouth 2 (two) times daily.    ? VITAMIN B COMPLEX-C CAPS Take 1 capsule by mouth 2 (two) times daily.    ? vitamin C (ASCORBIC ACID) 500 MG tablet Take 500 mg by mouth 2 (two) times daily.    ? zinc gluconate 50 MG tablet Take 50 mg by mouth daily.    ? ?No facility-administered medications prior to visit.  ? ? ? ? ? ?Objective:  ? ?Physical Exam: ? ?General appearance: 71 y.o.,  female, NAD, conversant  ?Eyes: anicteric sclerae; PERRL, tracking appropriately ?HENT: NCAT; MMM ?Neck: Trachea midline; no lymphadenopathy, no JVD ?Lungs: CTAB, no crackles, no wheeze, with  normal respiratory effort ?CV: RRR, no murmur  ?Abdomen: Soft, non-tender; non-distended, BS present  ?Extremities: No peripheral edema, warm ?Skin: Normal turgor and texture; no rash ?Psych: Appropriate affect ?Neuro: Alert and oriented to person and place, no focal deficit  ? ? ? ?There were no vitals filed for this visit. ?  on RA ?BMI Readings from Last 3 Encounters:  ?11/20/21 38.88 kg/m?  ?11/09/21 38.88 kg/m?  ?05/04/21 39.01 kg/m?  ? ?Wt Readings from Last 3 Encounters:  ?11/20/21 216 lb (98 kg)  ?11/09/21 216 lb (98 kg)  ?05/04/21 220 lb 3.2 oz (99.9 kg)  ? ? ? ?CBC ?   ?Component Value Date/Time  ? WBC 11.6 (H) 07/06/2020 1119  ? RBC 4.61 07/06/2020 1119  ? HGB 13.3 07/06/2020 1119  ? HCT 42.4 07/06/2020 1119  ? PLT 248 07/06/2020 1119  ? MCV 92.0 07/06/2020 1119  ? MCH 28.9 07/06/2020 1119  ? MCHC 31.4 07/06/2020 1119  ? RDW 13.3 07/06/2020 1119  ? LYMPHSABS 1.5 09/24/2018 2057  ? MONOABS 0.7 09/24/2018 2057  ? EOSABS 0.6 (H) 09/24/2018 2057  ? BASOSABS 0.0 09/24/2018 2057  ? ? ?Eos 600 08/2018 ? ?Chest Imaging: ?CT Cardiac scoring 11/01/21 reviewed by me with focal osteophyte induced atelectasis and fibrosis RLL, bronchial wall thickening but otherwise unremarkable ? ?Pulmonary Functions Testing Results: ?   ? View : No data to display.  ?  ?  ?  ? ? ? ?Echocardiogram:  ? ?TTE 12/19/21: ? 1. Left ventricular ejection fraction, by estimation, is 65 to 70%. The  ?left ventricle has normal function. The left ventricle has no regional  ?wall motion abnormalities. Left ventricular diastolic parameters are  ?indeterminate.  ? 2. Right ventricular systolic function is normal. The right ventricular  ?size is normal.  ? 3. Mild mitral valve regurgitation.  ? 4. The aortic valve is tricuspid. Aortic valve regurgitation is not   ?visualized. Mild aortic valve sclerosis is present, with no evidence of  ?aortic valve stenosis.  ? 5. The inferior vena cava is normal in size with greater than 50%  ?respiratory variability, suggesting r

## 2021-12-12 ENCOUNTER — Encounter: Payer: Self-pay | Admitting: Student

## 2021-12-12 ENCOUNTER — Ambulatory Visit: Payer: Medicare HMO | Admitting: Student

## 2021-12-12 VITALS — BP 124/78 | HR 81 | Temp 97.8°F | Ht 62.5 in | Wt 216.0 lb

## 2021-12-12 DIAGNOSIS — Z9989 Dependence on other enabling machines and devices: Secondary | ICD-10-CM

## 2021-12-12 DIAGNOSIS — G4733 Obstructive sleep apnea (adult) (pediatric): Secondary | ICD-10-CM

## 2021-12-12 DIAGNOSIS — R0600 Dyspnea, unspecified: Secondary | ICD-10-CM

## 2021-12-12 LAB — TSH: TSH: 3.45 u[IU]/mL (ref 0.35–5.50)

## 2021-12-12 NOTE — Patient Instructions (Addendum)
-   Labs today ?- try albuterol inhaler 1-2 puffs with spacer if you get another attack ?- shower or neti pot, clear out crusting and then flonase 1 spray each nostril - may take 3 weeks to reach peak effect ?- PFT (breathing tests) you will be called to schedule them in 6 weeks on same day as next clinic appointment ?- See you in 6 weeks! ? ?

## 2021-12-17 ENCOUNTER — Telehealth (HOSPITAL_COMMUNITY): Payer: Self-pay | Admitting: Emergency Medicine

## 2021-12-17 NOTE — Telephone Encounter (Signed)
Reaching out to patient to offer assistance regarding upcoming cardiac imaging study; pt verbalizes understanding of appt date/time, parking situation and where to check in, pre-test NPO status and medications ordered, and verified current allergies; name and call back number provided for further questions should they arise Rockwell Alexandria RN Navigator Cardiac Imaging [facility] [phone] office [phone] cell  Arrival 1000

## 2021-12-18 ENCOUNTER — Encounter (HOSPITAL_COMMUNITY)
Admission: RE | Admit: 2021-12-18 | Discharge: 2021-12-18 | Disposition: A | Discharge status: Home / Self Care | Payer: Medicare HMO | Source: Ambulatory Visit | Attending: Cardiology | Admitting: Cardiology

## 2021-12-18 DIAGNOSIS — R079 Chest pain, unspecified: Secondary | ICD-10-CM

## 2021-12-18 DIAGNOSIS — R0602 Shortness of breath: Secondary | ICD-10-CM

## 2021-12-18 DIAGNOSIS — I25119 Atherosclerotic heart disease of native coronary artery with unspecified angina pectoris: Secondary | ICD-10-CM | POA: Diagnosis not present

## 2021-12-18 LAB — NM PET CT CARDIAC PERFUSION MULTI W/ABSOLUTE BLOODFLOW
MBFR: 2.52
Nuc Rest EF: 56 %
Nuc Stress EF: 67 %
Peak HR: 95 {beats}/min
Rest HR: 75 {beats}/min
Rest MBF: 0.98 ml/g/min
Rest Nuclear Isotope Dose: 25.6 mCi
Rest perfusion cavity size (mL): 75 mL
ST Depression (mm): 0 mm
Stress MBF: 2.47 ml/g/min
Stress Nuclear Isotope Dose: 25.3 mCi
Stress perfusion cavity size (mL): 85 mL
TID: 1.11

## 2021-12-18 MED ORDER — RUBIDIUM RB82 GENERATOR (RUBYFILL)
25.3400 | PACK | Freq: Once | INTRAVENOUS | Status: AC
  Administered 2021-12-18: 25.34 via INTRAVENOUS

## 2021-12-18 MED ORDER — REGADENOSON 0.4 MG/5ML IV SOLN
INTRAVENOUS | Status: AC
  Administered 2021-12-18: 0.4 mg
  Filled 2021-12-18: qty 5

## 2021-12-18 MED ORDER — RUBIDIUM RB82 GENERATOR (RUBYFILL)
25.5800 | PACK | Freq: Once | INTRAVENOUS | Status: AC
  Administered 2021-12-18: 25.58 via INTRAVENOUS

## 2021-12-26 DIAGNOSIS — R7303 Prediabetes: Secondary | ICD-10-CM | POA: Diagnosis not present

## 2021-12-27 DIAGNOSIS — G4733 Obstructive sleep apnea (adult) (pediatric): Secondary | ICD-10-CM | POA: Diagnosis not present

## 2021-12-27 DIAGNOSIS — E039 Hypothyroidism, unspecified: Secondary | ICD-10-CM | POA: Diagnosis not present

## 2021-12-27 DIAGNOSIS — M069 Rheumatoid arthritis, unspecified: Secondary | ICD-10-CM | POA: Diagnosis not present

## 2021-12-27 DIAGNOSIS — I10 Essential (primary) hypertension: Secondary | ICD-10-CM | POA: Diagnosis not present

## 2021-12-27 DIAGNOSIS — R7303 Prediabetes: Secondary | ICD-10-CM | POA: Diagnosis not present

## 2021-12-27 DIAGNOSIS — R0609 Other forms of dyspnea: Secondary | ICD-10-CM | POA: Diagnosis not present

## 2021-12-27 DIAGNOSIS — N1831 Chronic kidney disease, stage 3a: Secondary | ICD-10-CM | POA: Diagnosis not present

## 2021-12-27 DIAGNOSIS — E7800 Pure hypercholesterolemia, unspecified: Secondary | ICD-10-CM | POA: Diagnosis not present

## 2021-12-27 DIAGNOSIS — I700 Atherosclerosis of aorta: Secondary | ICD-10-CM | POA: Diagnosis not present

## 2022-01-12 DIAGNOSIS — G4733 Obstructive sleep apnea (adult) (pediatric): Secondary | ICD-10-CM | POA: Diagnosis not present

## 2022-01-21 ENCOUNTER — Ambulatory Visit
Admission: RE | Admit: 2022-01-21 | Discharge: 2022-01-21 | Disposition: A | Discharge status: Home / Self Care | Payer: Medicare HMO | Source: Ambulatory Visit | Attending: Internal Medicine | Admitting: Internal Medicine

## 2022-01-21 ENCOUNTER — Other Ambulatory Visit: Payer: Self-pay | Admitting: Internal Medicine

## 2022-01-21 DIAGNOSIS — M545 Low back pain, unspecified: Secondary | ICD-10-CM | POA: Diagnosis not present

## 2022-01-21 DIAGNOSIS — M549 Dorsalgia, unspecified: Secondary | ICD-10-CM

## 2022-01-30 DIAGNOSIS — R350 Frequency of micturition: Secondary | ICD-10-CM | POA: Diagnosis not present

## 2022-02-03 NOTE — Progress Notes (Unsigned)
Synopsis: Referred for dyspnea by Georgann Housekeeper, MD  Subjective:   PATIENT ID: Elizabeth Roman GENDER: female DOB: 1950-12-21, MRN: [id number]  No chief complaint on file.  71yF with history of HTN, OSA, RA followed by Dr. Nickola Major referred from cardiology for dyspnea.   Dyspneic since 04/2021, worst when she first wakes up, sometimes feels 'like choking' sensation. Greater dyspnea with activity. Mentions history of severe allergy, better when she heads back to [place] than in [place].  The last bad attack that she has was a week ago where she feels like someone is sitting at top of her chest choking her. She hasn't yet tried albuterol inhaler for it during a bad episode. She has never been on daily inhaler before. Her hoarseness has worsened over last couple of months.   She does have chronic cough but it's not significantly bothersome. She describes it as a frequent throat clearing kind of cough.   She has never seen an allergist. She does have some sinus congestion and occasional postnasal drainage. Does use protonix intermittently for GERD.   She has seen ENT years ago who ordered CPAP for her. She is unsure who she sees for her CPAP now.   She never sleeps without CPAP.   Symptoms did not improve with trial of lasix. She is on ACEi and dose was recently increased.  Cardiology has ordered stress test.   She has no family history of lung disease other than mom who had asbestosis (she was Psychiatric nurse)   She worked as a Interior and spatial designer for many years. She is from [place] [facility] [place] She lives in [place] now.   Interval HPI: Given prn albuterol at last visit, PFTs today   Otherwise pertinent review of systems is negative.  Past Medical History:  Diagnosis Date   Arthritis    High cholesterol    Hypertension    Left hip pain 09/2019   Multiple joint pain 03/2020   HANDICAP PLACARD    Sleep apnea    Thyroid disease      Family History  Problem Relation Age of Onset   Breast  cancer Mother    Allergies Mother    Asthma Mother    Heart disease Father    Breast cancer Daughter    Cancer Daughter        breast     Past Surgical History:  Procedure Laterality Date   ABDOMINAL HYSTERECTOMY     BREAST BIOPSY     BUNIONECTOMY     CARPAL TUNNEL RELEASE Left    HAND SURGERY Right    MEDIAL PARTIAL KNEE REPLACEMENT     TONSILLECTOMY      Social History   Socioeconomic History   Marital status: Married    Spouse name: Not on file   Number of children: 8   Years of education: Not on file   Highest education level: Not on file  Occupational History   Occupation: RETIRED HAIR DRESSER  Tobacco Use   Smoking status: Never   Smokeless tobacco: Never  Vaping Use   Vaping Use: Never used  Substance and Sexual Activity   Alcohol use: Yes    Comment: occ   Drug use: No   Sexual activity: Not on file  Other Topics Concern   Not on file  Social History Narrative   Retired Interior and spatial designer    Social Determinants of Corporate investment banker Strain: Not on file  Food Insecurity: Not on file  Transportation Needs: Not  on file  Physical Activity: Not on file  Stress: Not on file  Social Connections: Not on file  Intimate Partner Violence: Not on file     Allergies  Allergen Reactions   Morphine And Related Anaphylaxis   Prednisone Hives and Other (See Comments)   Atorvastatin Other (See Comments)   Codeine Other (See Comments)    Very ill.   Hydroxychloroquine Sulfate     Other reaction(s): no benefit   Ivp Dye [Iodinated Contrast Media] Hives   Phenobarbital Hives   Propoxyphene    Sulfa Antibiotics Hives   Vicodin [Hydrocodone-Acetaminophen] Other (See Comments)    Very ill.     Outpatient Medications Prior to Visit  Medication Sig Dispense Refill   albuterol (VENTOLIN HFA) 108 (90 Base) MCG/ACT inhaler Inhale 2 puffs into the lungs every 6 (six) hours as needed for wheezing or shortness of breath. 8 g 2   amLODipine (NORVASC) 10 MG tablet  Take 1 tablet (10 mg total) by mouth at bedtime. 30 tablet 0   aspirin EC 81 MG tablet Take 81 mg by mouth daily. Swallow whole.     Bromelain 250 MG CAPS Take by mouth daily.     Coenzyme Q10 (CO Q-10) 200 MG CAPS Take 400 mg by mouth at bedtime.     Cranberry 500 MG CAPS Take by mouth. 2 times per day     ezetimibe (ZETIA) 10 MG tablet Take 1 tablet (10 mg total) by mouth daily. 90 tablet 1   ferrous sulfate 325 (65 FE) MG tablet daily.     furosemide (LASIX) 20 MG tablet Take 1 tablet (20 mg total) by mouth every other day. Take 1 tablet by mouth daily x 5 days then 1 tablet by mouth every other day. (Patient taking differently: Take 20 mg by mouth every other day.) 45 tablet 1   levothyroxine (SYNTHROID, LEVOTHROID) 75 MCG tablet Take 75 mcg by mouth daily before breakfast.     lisinopril (ZESTRIL) 20 MG tablet Take 1 tablet (20 mg total) by mouth daily. 90 tablet 1   MAGNESIUM PO Take 400 mg by mouth daily.     Multiple Vitamins-Minerals (ANTIOXIDANT PO) Take by mouth. 3 cap fulls daily     OVER THE COUNTER MEDICATION Stool softener 250 mg at bedtime     OVER THE COUNTER MEDICATION Glucosamine 2000mg  daily     pantoprazole (PROTONIX) 40 MG tablet Take 40 mg by mouth daily as needed (indigestion).     pravastatin (PRAVACHOL) 10 MG tablet Take 10 mg by mouth daily.     Turmeric (CURCUMIN 95 PO) Take 1 tablet by mouth 2 (two) times daily.     UNABLE TO FIND Med Name: D- Hist daily otc     VITAMIN B COMPLEX-C CAPS Take 1 capsule by mouth 2 (two) times daily.     vitamin C (ASCORBIC ACID) 500 MG tablet Take 500 mg by mouth 2 (two) times daily.     VITAMIN D PO Take 3,000 Units by mouth daily.     zinc gluconate 50 MG tablet Take 50 mg by mouth daily.     No facility-administered medications prior to visit.       Objective:   Physical Exam:  General appearance: 71 y.o., female, NAD, conversant, female, NAD, conversant  Eyes: anicteric sclerae; PERRL, tracking appropriately HENT: NCAT; MMM Neck: Trachea  midline; no lymphadenopathy, no JVD Lungs: CTAB, no crackles, no wheeze, with normal respiratory effort CV: RRR, no murmur  Abdomen: Soft, non-tender; non-distended, BS  present  Extremities: No peripheral edema, warm Skin: Normal turgor and texture; no rash Psych: Appropriate affect Neuro: Alert and oriented to person and place, no focal deficit     There were no vitals filed for this visit.   on RA BMI Readings from Last 3 Encounters:  12/12/21 38.88 kg/m  11/20/21 38.88 kg/m  11/09/21 38.88 kg/m   Wt Readings from Last 3 Encounters:  12/12/21 216 lb (98 kg)  11/20/21 216 lb (98 kg)  11/09/21 216 lb (98 kg)     CBC    Component Value Date/Time   WBC 11.6 (H) 07/06/2020 1119   RBC 4.61 07/06/2020 1119   HGB 13.3 07/06/2020 1119   HCT 42.4 07/06/2020 1119   PLT 248 07/06/2020 1119   MCV 92.0 07/06/2020 1119   MCH 28.9 07/06/2020 1119   MCHC 31.4 07/06/2020 1119   RDW 13.3 07/06/2020 1119   LYMPHSABS 1.5 09/24/2018 2057   MONOABS 0.7 09/24/2018 2057   EOSABS 0.6 (H) 09/24/2018 2057   BASOSABS 0.0 09/24/2018 2057    Eos 600 08/2018  TSH WNL  Chest Imaging: CT Cardiac scoring 11/01/21 reviewed by me with focal osteophyte induced atelectasis and fibrosis RLL, bronchial wall thickening but otherwise unremarkable  Pulmonary Functions Testing Results:     No data to display           Echocardiogram:   TTE 12/19/21:  1. Left ventricular ejection fraction, by estimation, is 65 to 70%. The  left ventricle has normal function. The left ventricle has no regional  wall motion abnormalities. Left ventricular diastolic parameters are  indeterminate.   2. Right ventricular systolic function is normal. The right ventricular  size is normal.   3. Mild mitral valve regurgitation.   4. The aortic valve is tricuspid. Aortic valve regurgitation is not  visualized. Mild aortic valve sclerosis is present, with no evidence of  aortic valve stenosis.   5. The inferior vena  cava is normal in size with greater than 50%  respiratory variability, suggesting right atrial pressure of 3 mmHg.   Ziopatch 10/03/20: Patch wear time was 6 days and 22 hours Predominant rhythm was NSR with average HR 85bpm (ranging from 65-169bpm) 2 episodes of non-sustained SVT with longest lasting 6 beats at rate of 156bpm and fastest lasting 4 beats at rate 169bpm (not associated with patient triggered event) Rare SVEs, rare PVCs <1% No AFib, pauses or sustained arrhythmias    PET/Perfusion 12/18/21: lowo risk Assessment & Plan:   # Dyspnea # Chronic cough Family history, episodic nature, eosinophilia, bronchial wall thickening, reported allergy raise concern for asthma. Her choking sensation, hoarseness and the level at which she feels tightness (near thoracic inlet) however raise possibility of VCD or alternative upper airway issue like irritable larynx/LPR. She's also had vocal cord polyp excision in past. Need to also be mindful of her recent ACEi dose increase. If we can't figure out her dyspnea/cough then we may need to consider alternative antihypertensive.   # OSA on CPAP  Plan: - tsh - has had BNP, BMP, CBC this year - PFT next visit - albuterol prn with spacer, instructed in use - discuss CPAP machine download next vsiit     Omar Person, MD [facility] 02/03/2022 3:22 PM

## 2022-02-05 ENCOUNTER — Encounter: Payer: Self-pay | Admitting: Student

## 2022-02-05 ENCOUNTER — Ambulatory Visit (INDEPENDENT_AMBULATORY_CARE_PROVIDER_SITE_OTHER): Payer: Medicare HMO | Admitting: Student

## 2022-02-05 ENCOUNTER — Ambulatory Visit: Payer: Medicare HMO | Admitting: Student

## 2022-02-05 VITALS — BP 128/70 | HR 82 | Temp 98.7°F | Ht 62.5 in | Wt 218.0 lb

## 2022-02-05 DIAGNOSIS — R053 Chronic cough: Secondary | ICD-10-CM

## 2022-02-05 DIAGNOSIS — Z9989 Dependence on other enabling machines and devices: Secondary | ICD-10-CM

## 2022-02-05 DIAGNOSIS — G4733 Obstructive sleep apnea (adult) (pediatric): Secondary | ICD-10-CM | POA: Diagnosis not present

## 2022-02-05 DIAGNOSIS — R0600 Dyspnea, unspecified: Secondary | ICD-10-CM

## 2022-02-05 LAB — PULMONARY FUNCTION TEST
DL/VA % pred: 80 %
DL/VA: 3.34 ml/min/mmHg/L
DLCO cor % pred: 97 %
DLCO cor: 17.78 ml/min/mmHg
DLCO unc % pred: 97 %
DLCO unc: 17.78 ml/min/mmHg
FEF 25-75 Post: 1.28 L/sec
FEF 25-75 Pre: 1.29 L/sec
FEF2575-%Change-Post: -1 %
FEF2575-%Pred-Post: 76 %
FEF2575-%Pred-Pre: 77 %
FEV1-%Change-Post: 1 %
FEV1-%Pred-Post: 105 %
FEV1-%Pred-Pre: 104 %
FEV1-Post: 2.15 L
FEV1-Pre: 2.13 L
FEV1FVC-%Change-Post: 6 %
FEV1FVC-%Pred-Pre: 91 %
FEV6-%Change-Post: -2 %
FEV6-%Pred-Post: 113 %
FEV6-%Pred-Pre: 116 %
FEV6-Post: 2.93 L
FEV6-Pre: 3.01 L
FEV6FVC-%Change-Post: 2 %
FEV6FVC-%Pred-Post: 105 %
FEV6FVC-%Pred-Pre: 102 %
FVC-%Change-Post: -4 %
FVC-%Pred-Post: 108 %
FVC-%Pred-Pre: 113 %
FVC-Post: 2.93 L
FVC-Pre: 3.07 L
Post FEV1/FVC ratio: 73 %
Post FEV6/FVC ratio: 100 %
Pre FEV1/FVC ratio: 69 %
Pre FEV6/FVC Ratio: 98 %
RV % pred: 143 %
RV: 3.11 L
TLC % pred: 129 %
TLC: 6.23 L

## 2022-02-05 NOTE — Patient Instructions (Addendum)
-   protonix 40 mg 30 minutes before breakfast daily for at least 8 weeks to see if it makes a difference with your hoarseness, cough, episodes of dyspnea - albuterol as needed - you will be called to schedule appointment with ENT - see you in 4 months

## 2022-02-05 NOTE — Progress Notes (Signed)
PFT done today. 

## 2022-02-08 ENCOUNTER — Other Ambulatory Visit: Payer: Self-pay

## 2022-02-08 MED ORDER — AMLODIPINE BESYLATE 10 MG PO TABS: 10.0000 mg | ORAL_TABLET | Freq: Every day | ORAL | 3 refills | Status: DC

## 2022-02-11 DIAGNOSIS — G4733 Obstructive sleep apnea (adult) (pediatric): Secondary | ICD-10-CM | POA: Diagnosis not present

## 2022-02-12 ENCOUNTER — Other Ambulatory Visit: Payer: Self-pay

## 2022-02-12 MED ORDER — AMLODIPINE BESYLATE 10 MG PO TABS: 10.0000 mg | ORAL_TABLET | Freq: Every day | ORAL | 2 refills | Status: DC

## 2022-02-20 DIAGNOSIS — L84 Corns and callosities: Secondary | ICD-10-CM | POA: Diagnosis not present

## 2022-02-20 DIAGNOSIS — M67962 Unspecified disorder of synovium and tendon, left lower leg: Secondary | ICD-10-CM | POA: Diagnosis not present

## 2022-02-20 DIAGNOSIS — M67961 Unspecified disorder of synovium and tendon, right lower leg: Secondary | ICD-10-CM | POA: Diagnosis not present

## 2022-02-26 DIAGNOSIS — E669 Obesity, unspecified: Secondary | ICD-10-CM | POA: Diagnosis not present

## 2022-02-26 DIAGNOSIS — E8889 Other specified metabolic disorders: Secondary | ICD-10-CM | POA: Diagnosis not present

## 2022-02-26 DIAGNOSIS — M069 Rheumatoid arthritis, unspecified: Secondary | ICD-10-CM | POA: Diagnosis not present

## 2022-02-26 DIAGNOSIS — Z1331 Encounter for screening for depression: Secondary | ICD-10-CM | POA: Diagnosis not present

## 2022-02-26 DIAGNOSIS — R5383 Other fatigue: Secondary | ICD-10-CM | POA: Diagnosis not present

## 2022-02-26 DIAGNOSIS — G4733 Obstructive sleep apnea (adult) (pediatric): Secondary | ICD-10-CM | POA: Diagnosis not present

## 2022-03-04 DIAGNOSIS — R350 Frequency of micturition: Secondary | ICD-10-CM | POA: Diagnosis not present

## 2022-03-19 DIAGNOSIS — E669 Obesity, unspecified: Secondary | ICD-10-CM | POA: Diagnosis not present

## 2022-03-19 DIAGNOSIS — Z6837 Body mass index (BMI) 37.0-37.9, adult: Secondary | ICD-10-CM | POA: Diagnosis not present

## 2022-03-19 DIAGNOSIS — R7303 Prediabetes: Secondary | ICD-10-CM | POA: Diagnosis not present

## 2022-04-09 ENCOUNTER — Other Ambulatory Visit: Payer: Self-pay | Admitting: Internal Medicine

## 2022-04-09 DIAGNOSIS — Z6837 Body mass index (BMI) 37.0-37.9, adult: Secondary | ICD-10-CM | POA: Diagnosis not present

## 2022-04-09 DIAGNOSIS — E7800 Pure hypercholesterolemia, unspecified: Secondary | ICD-10-CM | POA: Diagnosis not present

## 2022-04-09 DIAGNOSIS — Z1231 Encounter for screening mammogram for malignant neoplasm of breast: Secondary | ICD-10-CM

## 2022-04-09 DIAGNOSIS — G4733 Obstructive sleep apnea (adult) (pediatric): Secondary | ICD-10-CM | POA: Diagnosis not present

## 2022-04-19 DIAGNOSIS — M67961 Unspecified disorder of synovium and tendon, right lower leg: Secondary | ICD-10-CM | POA: Diagnosis not present

## 2022-04-19 DIAGNOSIS — L84 Corns and callosities: Secondary | ICD-10-CM | POA: Diagnosis not present

## 2022-04-19 DIAGNOSIS — M67962 Unspecified disorder of synovium and tendon, left lower leg: Secondary | ICD-10-CM | POA: Diagnosis not present

## 2022-04-23 DIAGNOSIS — G4733 Obstructive sleep apnea (adult) (pediatric): Secondary | ICD-10-CM | POA: Diagnosis not present

## 2022-04-23 DIAGNOSIS — E669 Obesity, unspecified: Secondary | ICD-10-CM | POA: Diagnosis not present

## 2022-04-23 DIAGNOSIS — R7303 Prediabetes: Secondary | ICD-10-CM | POA: Diagnosis not present

## 2022-04-23 DIAGNOSIS — Z6837 Body mass index (BMI) 37.0-37.9, adult: Secondary | ICD-10-CM | POA: Diagnosis not present

## 2022-05-20 ENCOUNTER — Ambulatory Visit
Admission: RE | Admit: 2022-05-20 | Discharge: 2022-05-20 | Disposition: A | Discharge status: Home / Self Care | Payer: Medicare HMO | Source: Ambulatory Visit | Attending: Internal Medicine | Admitting: Internal Medicine

## 2022-05-20 DIAGNOSIS — R490 Dysphonia: Secondary | ICD-10-CM | POA: Diagnosis not present

## 2022-05-20 DIAGNOSIS — Z1231 Encounter for screening mammogram for malignant neoplasm of breast: Secondary | ICD-10-CM | POA: Diagnosis not present

## 2022-05-20 DIAGNOSIS — J309 Allergic rhinitis, unspecified: Secondary | ICD-10-CM | POA: Diagnosis not present

## 2022-05-20 DIAGNOSIS — R42 Dizziness and giddiness: Secondary | ICD-10-CM | POA: Diagnosis not present

## 2022-05-20 DIAGNOSIS — J343 Hypertrophy of nasal turbinates: Secondary | ICD-10-CM | POA: Diagnosis not present

## 2022-05-20 DIAGNOSIS — K219 Gastro-esophageal reflux disease without esophagitis: Secondary | ICD-10-CM | POA: Diagnosis not present

## 2022-05-20 DIAGNOSIS — R0982 Postnasal drip: Secondary | ICD-10-CM | POA: Diagnosis not present

## 2022-05-22 DIAGNOSIS — R309 Painful micturition, unspecified: Secondary | ICD-10-CM | POA: Diagnosis not present

## 2022-05-30 NOTE — Progress Notes (Unsigned)
Cardiology Office Note:    Date:  05/30/2022   ID:  Elizabeth Roman, DOB 03-02-1951, MRN [id number]  PCP:  Georgann Housekeeper, MD   [facility]  Cardiologist:  Meriam Sprague, MD  Advanced Practice Provider:  No care team member to display Electrophysiologist:  None    Referring MD: Georgann Housekeeper, MD    History of Present Illness:    Elizabeth Roman is a 71 y.o. female with a hx of HTN, HLD and OSA who returns to clinic for follow-up.  Was seen in the ED 07/06/20 for episode of lightheadedness and pre-syncope while shopping. UA there positive and patient had UTI and patient recently completed course of ABX. She was started on keflex in the ED. BG was also low at 61 which improved with eating. ECG with NSR, low voltage, no ischemia. She clinically improved and was sent home.   Was seen in clinic on 09/19/20 where she was having episodes of lightheadedness. We placed a cardiac monitor which showed 2 episodes of NS SVT but no significant arrhythmias or ectopy. She is also scheduled for TTE.  Seen in clinic on 10/2020 where she reported worsening extremity edema with duplex negative for DVT. Subsequent echocardiogram 12/19/2020 with normal LVEF, no R WMA, determinate diastolic parameters, mild MR.  Was seen as an urgent visit on 11/09/21 by Dr. Graciela Husbands for worsening dyspnea on exertion. She was started on lasix 20mg  PO x5 days.   Calcium score from 11/01/21 was 674 which is at 92nd percentile for age and sex. Atherosclerosis  of the thoracic aorta, calcification of mitral valve annulus, and small hiatal hernia here also found.   Was last seen in clinic on 10/2021 where she continued to have SOB. NM PET with no evidence of ischemia or infarction. EF 56%, normal MBFR.   Today,***  Past Medical History:  Diagnosis Date   Arthritis    High cholesterol    Hypertension    Left hip pain 09/2019   Multiple joint pain 03/2020   HANDICAP PLACARD    Sleep apnea    Thyroid  disease     Past Surgical History:  Procedure Laterality Date   ABDOMINAL HYSTERECTOMY     BREAST BIOPSY     BUNIONECTOMY     CARPAL TUNNEL RELEASE Left    HAND SURGERY Right    MEDIAL PARTIAL KNEE REPLACEMENT     TONSILLECTOMY      Current Medications: No outpatient medications have been marked as taking for the 06/03/22 encounter (Appointment) with 13/6/23, MD.     Allergies:   Morphine and related, Prednisone, Atorvastatin, Codeine, Hydroxychloroquine sulfate, Ivp dye [iodinated contrast media], Phenobarbital, Propoxyphene, Sulfa antibiotics, and Vicodin [hydrocodone-acetaminophen]   Social History   Socioeconomic History   Marital status: Married    Spouse name: Not on file   Number of children: 8   Years of education: Not on file   Highest education level: Not on file  Occupational History   Occupation: RETIRED HAIR DRESSER  Tobacco Use   Smoking status: Never   Smokeless tobacco: Never  Vaping Use   Vaping Use: Never used  Substance and Sexual Activity   Alcohol use: Yes    Comment: occ   Drug use: No   Sexual activity: Not on file  Other Topics Concern   Not on file  Social History Narrative   Retired Meriam Sprague    Social Determinants of Interior and spatial designer Strain: Not on file  Food Insecurity: Not on file  Transportation Needs: Not on file  Physical Activity: Not on file  Stress: Not on file  Social Connections: Not on file     Family History: The patient's family history includes Allergies in her mother; Asthma in her mother; Breast cancer in her daughter and mother; Cancer in her daughter; Heart disease in her father. Family history: Father with CAD (died of MI at 64), paternal uncles and aunts with CAD.   ROS:   Please see the history of present illness.    Review of Systems  Constitutional:  Negative for chills and fever.  HENT:  Negative for hearing loss and sinus pain.   Eyes:  Negative for blurred vision and redness.   Respiratory:  Positive for shortness of breath.   Cardiovascular:  Negative for chest pain, palpitations, orthopnea, claudication, leg swelling and PND.  Gastrointestinal:  Negative for nausea and vomiting.  Genitourinary:  Negative for frequency.  Musculoskeletal:  Positive for neck pain (R-sided). Negative for falls and joint pain.  Neurological:  Positive for headaches. Negative for dizziness and loss of consciousness.  Endo/Heme/Allergies:  Positive for environmental allergies.  Psychiatric/Behavioral:  Negative for substance abuse.     EKGs/Labs/Other Studies Reviewed:    The following studies were reviewed today: NM PET 11/2021:    LV perfusion is normal. There is no evidence of ischemia. There is no evidence of infarction.   Rest left ventricular function is normal. Rest EF: 56 %. Stress left ventricular function is normal. Stress EF: 67 %. End diastolic cavity size is normal.   Myocardial blood flow was computed to be 0.53ml/g/min at rest and 2.9ml/g/min at stress. Global myocardial blood flow reserve was 2.52 and was normal.   Coronary calcium was present on the attenuation correction CT images. Severe coronary calcifications were present. Coronary calcifications were present in the left anterior descending artery, left circumflex artery and right coronary artery distribution(s).   The study is normal. The study is low risk.   Electronically signed by Lennie Odor, MD  CT Cardiac Score 11/01/21 IMPRESSION: 1. Coronary calcium score of 674 is at the 92nd percentile for the patient's age, sex and race. 2. Atherosclerosis of the thoracic aorta. 3. Calcifications of the mitral valve annulus. 4. Small hiatal hernia.  TTE 11/2020: IMPRESSIONS     1. Left ventricular ejection fraction, by estimation, is 65 to 70%. The  left ventricle has normal function. The left ventricle has no regional  wall motion abnormalities. Left ventricular diastolic parameters are  indeterminate.    2. Right ventricular systolic function is normal. The right ventricular  size is normal.   3. Mild mitral valve regurgitation.   4. The aortic valve is tricuspid. Aortic valve regurgitation is not  visualized. Mild aortic valve sclerosis is present, with no evidence of  aortic valve stenosis.   5. The inferior vena cava is normal in size with greater than 50%  respiratory variability, suggesting right atrial pressure of 3 mmHg.  Cardiac Monitor 10/03/20: Patch wear time was 6 days and 22 hours Predominant rhythm was NSR with average HR 85bpm (ranging from 65-169bpm) 2 episodes of non-sustained SVT with longest lasting 6 beats at rate of 156bpm and fastest lasting 4 beats at rate 169bpm (not associated with patient triggered event) Rare SVEs, rare PVCs <1% No AFib, pauses or sustained arrhythmias  EKG:  EKG was not ordered today 09/19/20: NSR with HR 89  Recent Labs: 11/09/2021: NT-Pro BNP 114 11/26/2021: BUN 29; Creatinine, Ser  1.03; Potassium 4.9; Sodium 138 12/12/2021: TSH 3.45  Recent Lipid Panel No results found for: "CHOL", "TRIG", "HDL", "CHOLHDL", "VLDL", "LDLCALC", "LDLDIRECT"    Physical Exam:    VS:  There were no vitals taken for this visit.    Wt Readings from Last 3 Encounters:  02/05/22 218 lb (98.9 kg)  12/12/21 216 lb (98 kg)  11/20/21 216 lb (98 kg)   GEN:  Well nourished, well developed in no acute distress HEENT: Normal NECK: No JVD; No carotid bruits LYMPHATICS: No lymphadenopathy CARDIAC: RRR, no murmurs, rubs, gallops RESPIRATORY:  Clear to auscultation without rales, wheezing or rhonchi  ABDOMEN: Soft, non-tender, non-distended MUSCULOSKELETAL:  No edema; No deformity  SKIN: Warm and dry NEUROLOGIC:  Alert and oriented x 3 PSYCHIATRIC:  Normal affect   ASSESSMENT:    No diagnosis found.   PLAN:    In order of problems listed above:  #CAD: Ca score 672 which was the 92% for age, gender, race matched controls. TTE in 11/2020 with normal BiV  function. NM PET 11/2021 normal with no ischemia or infarction. Normal LVEF. Normal myocardial blood flow. Unlikely that dyspnea is due to underlying CAD.  -Continue ASA 81mg  daily -Continue prava 10mg  daily and zetia 10mg  daily  #Dyspnea: Reassuring cardiac work-up as above. TTE 11/2020 with normal BiV function and no significant valve disease. BNP normal and symptoms did not change with lasix. NM PET normal with no ischemia, infarction and normal EF and MBFR. Notably, symptoms resolved while in [place] and patient reports bad allergies currently. Will refer to Pulm for further evaluation. -Referred to Pulm  #HTN: Well controlled at home. -Continue lisinopril 20mg  daily  #HLD: -Continue pravastatin 10mg  daily -Continue zetia 10mg  daily -Repeat lipids in 11/2021 to ensure LDL at goal given elevated Ca score   Medication Adjustments/Labs and Tests Ordered: Current medicines are reviewed at length with the patient today.  Concerns regarding medicines are outlined above.  No orders of the defined types were placed in this encounter.  No orders of the defined types were placed in this encounter.   There are no Patient Instructions on file for this visit.     Follow-up in 6 months.  I,Mykaella Javier,acting as a scribe for Freada Bergeron, MD.,have documented all relevant documentation on the behalf of Freada Bergeron, MD,as directed by  Freada Bergeron, MD while in the presence of Freada Bergeron, MD.  I, Freada Bergeron, MD, have reviewed all documentation for this visit. The documentation on 05/30/22 for the exam, diagnosis, procedures, and orders are all accurate and complete.   Signed, Freada Bergeron, MD  05/30/2022 7:44 PM    [place]

## 2022-06-03 ENCOUNTER — Ambulatory Visit: Payer: Medicare HMO | Attending: Cardiology | Admitting: Cardiology

## 2022-06-03 ENCOUNTER — Encounter: Payer: Self-pay | Admitting: Cardiology

## 2022-06-03 VITALS — BP 120/90 | HR 85 | Ht 63.0 in | Wt 210.0 lb

## 2022-06-03 DIAGNOSIS — E782 Mixed hyperlipidemia: Secondary | ICD-10-CM | POA: Diagnosis not present

## 2022-06-03 DIAGNOSIS — R42 Dizziness and giddiness: Secondary | ICD-10-CM | POA: Diagnosis not present

## 2022-06-03 DIAGNOSIS — R309 Painful micturition, unspecified: Secondary | ICD-10-CM | POA: Diagnosis not present

## 2022-06-03 DIAGNOSIS — R0602 Shortness of breath: Secondary | ICD-10-CM

## 2022-06-03 DIAGNOSIS — I10 Essential (primary) hypertension: Secondary | ICD-10-CM

## 2022-06-03 DIAGNOSIS — I2510 Atherosclerotic heart disease of native coronary artery without angina pectoris: Secondary | ICD-10-CM | POA: Diagnosis not present

## 2022-06-03 LAB — LIPID PANEL
Chol/HDL Ratio: 4.6 ratio — ABNORMAL HIGH (ref 0.0–4.4)
Cholesterol, Total: 274 mg/dL — ABNORMAL HIGH (ref 100–199)
HDL: 60 mg/dL (ref >39)
LDL Chol Calc (NIH): 177 mg/dL — ABNORMAL HIGH (ref 0–99)
Triglycerides: 199 mg/dL — ABNORMAL HIGH (ref 0–149)
VLDL Cholesterol Cal: 37 mg/dL (ref 5–40)

## 2022-06-03 NOTE — Patient Instructions (Signed)
Medication Instructions:   Your physician recommends that you continue on your current medications as directed. Please refer to the Current Medication list given to you today.  *If you need a refill on your cardiac medications before your next appointment, please call your pharmacy*   You have been referred to [place]    Lab Work:  TODAY--LIPIDS  If you have labs (blood work) drawn today and your tests are completely normal, you will receive your results only by: MyChart Message (if you have MyChart) OR A paper copy in the mail If you have any lab test that is abnormal or we need to change your treatment, we will call you to review the results.     Follow-Up: At [facility], you and your health needs are our priority.  As part of our continuing mission to provide you with exceptional heart care, we have created designated Provider Care Teams.  These Care Teams include your primary Cardiologist (physician) and Advanced Practice Providers (APPs -  Physician Assistants and Nurse Practitioners) who all work together to provide you with the care you need, when you need it.  We recommend signing up for the patient portal called "MyChart".  Sign up information is provided on this After Visit Summary.  MyChart is used to connect with patients for Virtual Visits (Telemedicine).  Patients are able to view lab/test results, encounter notes, upcoming appointments, etc.  Non-urgent messages can be sent to your provider as well.   To learn more about what you can do with MyChart, go to [email or web address].    Your next appointment:   6 month(s)  The format for your next appointment:   In Person  Provider:   Robbie Lis, PA-C, Nicholes Rough, PA-C, Melina Copa, PA-C, Ambrose Pancoast, NP, Cecilie Kicks, NP, Ermalinda Barrios, PA-C, Christen Bame, NP, or Richardson Dopp, PA-C           Important Information About Sugar

## 2022-06-03 NOTE — Progress Notes (Signed)
Cardiology Office Note:    Date:  06/03/2022   ID:  Elizabeth Roman, DOB 1950-09-02, MRN [id number]  PCP:  Wenda Low, MD   [place]  Cardiologist:  Freada Bergeron, MD  Advanced Practice Provider:  No care team member to display Electrophysiologist:  None   Referring MD: Wenda Low, MD    History of Present Illness:    Elizabeth Roman is a 71 y.o. female with a hx of HTN, HLD and OSA who returns to clinic for follow-up.  Was seen in the ED 07/06/20 for episode of lightheadedness and pre-syncope while shopping. UA there positive and patient had UTI and patient recently completed course of ABX. She was started on keflex in the ED. BG was also low at 61 which improved with eating. ECG with NSR, low voltage, no ischemia. She clinically improved and was sent home.   Was seen in clinic on 09/19/20 where she was having episodes of lightheadedness. We placed a cardiac monitor which showed 2 episodes of NS SVT but no significant arrhythmias or ectopy. She is also scheduled for TTE.  Seen in clinic on 10/2020 where she reported worsening extremity edema with duplex negative for DVT. Subsequent echocardiogram 12/19/2020 with normal LVEF, no R WMA, determinate diastolic parameters, mild MR.  Was seen as an urgent visit on 11/09/21 by Dr. Caryl Comes for worsening dyspnea on exertion. She was started on lasix 20mg  PO x5 days.   Calcium score from 11/01/21 was 674 which is at 92nd percentile for age and sex. Atherosclerosis  of the thoracic aorta, calcification of mitral valve annulus, and small hiatal hernia here also found.   Was last seen in clinic on 10/2021 where she continued to have SOB. NM PET with no evidence of ischemia or infarction. EF 56%, normal MBFR.   Today, the patient states that a month ago she had a severe dizzy spell. At the time she had just eaten breakfast, and was getting out of the car. She suddenly couldn't move or function, and knew she was falling.  She did fall and hit the blacktop. She denies loss of consciousness. She went into her home and her blood pressure was found to be lower than normal, about 110/50-60. Usually her readings are closer to 120/70-80. No readings lower than [id number] systolic.  The dizzy spell described above was "the worst she ever had." Generally her dizzy spells are more mild, and occur while she is already standing. She then feels as though she can't focus, and feels like she will fall. The dizziness occurs suddenly, forcing her to grab onto something to steady herself. It is more a balance issue and she denies any presyncopal symptoms. Had seen ENT who recommended vestibular therapy.   She remains active and is able to walk 3 miles without chest discomfort.   She denies any palpitations, chest pain, or peripheral edema. No headaches, orthopnea, or PND.   Past Medical History:  Diagnosis Date   Arthritis    High cholesterol    Hypertension    Left hip pain 09/2019   Multiple joint pain 03/2020   HANDICAP PLACARD    Sleep apnea    Thyroid disease     Past Surgical History:  Procedure Laterality Date   ABDOMINAL HYSTERECTOMY     BREAST BIOPSY     BUNIONECTOMY     CARPAL TUNNEL RELEASE Left    HAND SURGERY Right    MEDIAL PARTIAL KNEE REPLACEMENT     TONSILLECTOMY  Current Medications: Current Meds  Medication Sig   albuterol (VENTOLIN HFA) 108 (90 Base) MCG/ACT inhaler Inhale 2 puffs into the lungs every 6 (six) hours as needed for wheezing or shortness of breath.   amLODipine (NORVASC) 10 MG tablet Take 1 tablet (10 mg total) by mouth at bedtime.   Bromelain 250 MG CAPS Take by mouth daily.   Coenzyme Q10 (CO Q-10) 200 MG CAPS Take 400 mg by mouth at bedtime.   Cranberry 500 MG CAPS Take by mouth. 2 times per day   ferrous sulfate 325 (65 FE) MG tablet daily.   fluticasone (FLONASE) 50 MCG/ACT nasal spray Administer 2 sprays in each nostril daily.   levothyroxine (SYNTHROID, LEVOTHROID) 75 MCG  tablet Take 75 mcg by mouth daily before breakfast.   lisinopril (ZESTRIL) 20 MG tablet Take 1 tablet (20 mg total) by mouth daily.   MAGNESIUM PO Take 400 mg by mouth daily.   Multiple Vitamins-Minerals (ANTIOXIDANT PO) Take by mouth. 3 cap fulls daily   OVER THE COUNTER MEDICATION Stool softener 250 mg at bedtime   OVER THE COUNTER MEDICATION Glucosamine 2000mg  daily. Patient stated "taking in powder form"   pantoprazole (PROTONIX) 40 MG tablet Take 40 mg by mouth daily as needed (indigestion).   Turmeric (CURCUMIN 95 PO) Take 1 tablet by mouth 2 (two) times daily.   VITAMIN B COMPLEX-C CAPS Take 1 capsule by mouth 2 (two) times daily.   vitamin C (ASCORBIC ACID) 500 MG tablet Take 500 mg by mouth 2 (two) times daily.   VITAMIN D PO Take by mouth daily. Per patient taking 5,000 units taking every evening/   zinc gluconate 50 MG tablet Take 50 mg by mouth daily.   [DISCONTINUED] aspirin EC 81 MG tablet Take 81 mg by mouth daily. Swallow whole.   [DISCONTINUED] ezetimibe (ZETIA) 10 MG tablet Take 1 tablet (10 mg total) by mouth daily.   [DISCONTINUED] furosemide (LASIX) 20 MG tablet Take 1 tablet (20 mg total) by mouth every other day. Take 1 tablet by mouth daily x 5 days then 1 tablet by mouth every other day. (Patient taking differently: Take 20 mg by mouth every other day.)   [DISCONTINUED] pravastatin (PRAVACHOL) 10 MG tablet Take 10 mg by mouth daily.   [DISCONTINUED] UNABLE TO FIND Med Name: D- Hist daily otc     Allergies:   Morphine and related, Prednisone, Atorvastatin, Codeine, Hydroxychloroquine sulfate, Ivp dye [iodinated contrast media], Phenobarbital, Propoxyphene, Sulfa antibiotics, and Vicodin [hydrocodone-acetaminophen]   Social History   Socioeconomic History   Marital status: Married    Spouse name: Not on file   Number of children: 8   Years of education: Not on file   Highest education level: Not on file  Occupational History   Occupation: RETIRED HAIR DRESSER   Tobacco Use   Smoking status: Never   Smokeless tobacco: Never  Vaping Use   Vaping Use: Never used  Substance and Sexual Activity   Alcohol use: Yes    Comment: occ   Drug use: No   Sexual activity: Not on file  Other Topics Concern   Not on file  Social History Narrative   Retired Theme park manager    Social Determinants of Radio broadcast assistant Strain: Not on file  Food Insecurity: Not on file  Transportation Needs: Not on file  Physical Activity: Not on file  Stress: Not on file  Social Connections: Not on file     Family History: The patient's family history includes  Allergies in her mother; Asthma in her mother; Breast cancer in her daughter and mother; Cancer in her daughter; Heart disease in her father. Family history: Father with CAD (died of MI at 45), paternal uncles and aunts with CAD.   ROS:   Please see the history of present illness.    Review of Systems  Constitutional:  Negative for chills and fever.  HENT:  Negative for hearing loss and sinus pain.   Eyes:  Negative for blurred vision and redness.  Respiratory:  Positive for shortness of breath.   Cardiovascular:  Negative for chest pain, palpitations, orthopnea, claudication, leg swelling and PND.  Gastrointestinal:  Negative for nausea and vomiting.  Genitourinary:  Negative for frequency.  Musculoskeletal:  Positive for falls. Negative for joint pain.  Neurological:  Positive for dizziness. Negative for loss of consciousness.  Endo/Heme/Allergies:  Positive for environmental allergies.  Psychiatric/Behavioral:  Negative for substance abuse.     EKGs/Labs/Other Studies Reviewed:    The following studies were reviewed today:  NM PET 11/2021:   LV perfusion is normal. There is no evidence of ischemia. There is no evidence of infarction.   Rest left ventricular function is normal. Rest EF: 56 %. Stress left ventricular function is normal. Stress EF: 67 %. End diastolic cavity size is normal.    Myocardial blood flow was computed to be 0.64ml/g/min at rest and 2.45ml/g/min at stress. Global myocardial blood flow reserve was 2.52 and was normal.   Coronary calcium was present on the attenuation correction CT images. Severe coronary calcifications were present. Coronary calcifications were present in the left anterior descending artery, left circumflex artery and right coronary artery distribution(s).   The study is normal. The study is low risk.   Electronically signed by Eleonore Chiquito, MD  CT Cardiac Score 11/01/21 IMPRESSION: 1. Coronary calcium score of 674 is at the 92nd percentile for the patient's age, sex and race. 2. Atherosclerosis of the thoracic aorta. 3. Calcifications of the mitral valve annulus. 4. Small hiatal hernia.  TTE 11/2020: IMPRESSIONS   1. Left ventricular ejection fraction, by estimation, is 65 to 70%. The  left ventricle has normal function. The left ventricle has no regional  wall motion abnormalities. Left ventricular diastolic parameters are  indeterminate.   2. Right ventricular systolic function is normal. The right ventricular  size is normal.   3. Mild mitral valve regurgitation.   4. The aortic valve is tricuspid. Aortic valve regurgitation is not  visualized. Mild aortic valve sclerosis is present, with no evidence of  aortic valve stenosis.   5. The inferior vena cava is normal in size with greater than 50%  respiratory variability, suggesting right atrial pressure of 3 mmHg.  Cardiac Monitor 10/03/20: Patch wear time was 6 days and 22 hours Predominant rhythm was NSR with average HR 85bpm (ranging from 65-169bpm) 2 episodes of non-sustained SVT with longest lasting 6 beats at rate of 156bpm and fastest lasting 4 beats at rate 169bpm (not associated with patient triggered event) Rare SVEs, rare PVCs <1% No AFib, pauses or sustained arrhythmias  EKG:  EKG is personally reviewed. 06/03/2022:  EKG was not ordered. 11/09/2021 (Dr. Caryl Comes):  Sinus  at 75, Intervals 15/08/36  09/19/20: NSR with HR 89  Recent Labs: 11/09/2021: NT-Pro BNP 114 11/26/2021: BUN 29; Creatinine, Ser 1.03; Potassium 4.9; Sodium 138 12/12/2021: TSH 3.45   Recent Lipid Panel No results found for: "CHOL", "TRIG", "HDL", "CHOLHDL", "VLDL", "LDLCALC", "LDLDIRECT"   Physical Exam:    VS:  BP (!) 120/90   Pulse 85   Ht 5\' 3"  (1.6 m)   Wt 210 lb (95.3 kg)   SpO2 96%   BMI 37.20 kg/m     Wt Readings from Last 3 Encounters:  06/03/22 210 lb (95.3 kg)  02/05/22 218 lb (98.9 kg)  12/12/21 216 lb (98 kg)   GEN:  Well nourished, well developed in no acute distress HEENT: Normal NECK: No JVD; No carotid bruits LYMPHATICS: No lymphadenopathy CARDIAC: RRR, no murmurs, rubs, gallops RESPIRATORY:  Clear to auscultation without rales, wheezing or rhonchi  ABDOMEN: Soft, non-tender, non-distended MUSCULOSKELETAL:  No edema; No deformity  SKIN: Warm and dry NEUROLOGIC:  Alert and oriented x 3 PSYCHIATRIC:  Normal affect   ASSESSMENT:    1. Coronary artery disease involving native coronary artery of native heart without angina pectoris   2. Mixed hyperlipidemia   3. Vertigo   4. SOB (shortness of breath)   5. Essential hypertension   6. Dizziness     PLAN:    In order of problems listed above:  #CAD: Ca score 672 which was the 92% for age, gender, race matched controls. TTE in 11/2020 with normal BiV function. NM PET 11/2021 normal with no ischemia or infarction. Normal LVEF. Normal myocardial blood flow. Unlikely that dyspnea is due to underlying CAD.  -Continue ASA 81mg  daily -Continue prava 10mg  daily and zetia 10mg  daily  #Dyspnea: Reassuring cardiac work-up as above. TTE 11/2020 with normal BiV function and no significant valve disease. BNP normal and symptoms did not change with lasix. NM PET normal with no ischemia, infarction and normal EF and MBFR. Notably, symptoms resolved while in [place] and patient reports bad allergies currently. Will refer  to Pulm for further evaluation. -Referred to Pulm  #HTN: Well controlled at home. -Continue lisinopril 20mg  daily -Continue amlodipine 10mg  daily  #HLD: -Continue pravastatin 10mg  daily -Continue zetia 10mg  daily -Check lipids today  #Vertigo: Having episodes of dizziness where she feels like she is going to fall due to unsteadiness on her feet. No lightheadedness or presyncopal symptoms. Was seen by ENT and recommended for vestibular therapy but patient declined. She is hoping it is not a central issue and wishes to see Neurology. Will refer.   Follow-up in 6 months.  Medication Adjustments/Labs and Tests Ordered: Current medicines are reviewed at length with the patient today.  Concerns regarding medicines are outlined above.   Orders Placed This Encounter  Procedures   Lipid Profile   Ambulatory referral to Neurology   No orders of the defined types were placed in this encounter.  Patient Instructions  Medication Instructions:   Your physician recommends that you continue on your current medications as directed. Please refer to the Current Medication list given to you today.  *If you need a refill on your cardiac medications before your next appointment, please call your pharmacy*   You have been referred to [facility]    Lab Work:  TODAY--LIPIDS  If you have labs (blood work) drawn today and your tests are completely normal, you will receive your results only by: MyChart Message (if you have MyChart) OR A paper copy in the mail If you have any lab test that is abnormal or we need to change your treatment, we will call you to review the results.     Follow-Up: At [facility], you and your health needs are our priority.  As part of our continuing mission to provide  you with exceptional heart care, we have created designated Provider Care Teams.  These Care Teams include your primary Cardiologist  (physician) and Advanced Practice Providers (APPs -  Physician Assistants and Nurse Practitioners) who all work together to provide you with the care you need, when you need it.  We recommend signing up for the patient portal called "MyChart".  Sign up information is provided on this After Visit Summary.  MyChart is used to connect with patients for Virtual Visits (Telemedicine).  Patients are able to view lab/test results, encounter notes, upcoming appointments, etc.  Non-urgent messages can be sent to your provider as well.   To learn more about what you can do with MyChart, go to [email or web address].    Your next appointment:   6 month(s)  The format for your next appointment:   In Person  Provider:   Robbie Lis, PA-C, Nicholes Rough, PA-C, Melina Copa, PA-C, Ambrose Pancoast, NP, Cecilie Kicks, NP, Ermalinda Barrios, PA-C, Christen Bame, NP, or Richardson Dopp, PA-C           Important Information About Sugar        I,Mathew Stumpf,acting as a scribe for Freada Bergeron, MD.,have documented all relevant documentation on the behalf of Freada Bergeron, MD,as directed by  Freada Bergeron, MD while in the presence of Freada Bergeron, MD.  I, Freada Bergeron, MD, have reviewed all documentation for this visit. The documentation on 06/03/22 for the exam, diagnosis, procedures, and orders are all accurate and complete.   Signed, Freada Bergeron, MD  06/03/2022 11:21 AM    Elizabeth Roman

## 2022-06-04 ENCOUNTER — Telehealth: Payer: Self-pay | Admitting: Cardiology

## 2022-06-04 DIAGNOSIS — H2513 Age-related nuclear cataract, bilateral: Secondary | ICD-10-CM | POA: Diagnosis not present

## 2022-06-04 DIAGNOSIS — E669 Obesity, unspecified: Secondary | ICD-10-CM | POA: Diagnosis not present

## 2022-06-04 DIAGNOSIS — Z6836 Body mass index (BMI) 36.0-36.9, adult: Secondary | ICD-10-CM | POA: Diagnosis not present

## 2022-06-04 DIAGNOSIS — G4733 Obstructive sleep apnea (adult) (pediatric): Secondary | ICD-10-CM | POA: Diagnosis not present

## 2022-06-04 DIAGNOSIS — R7303 Prediabetes: Secondary | ICD-10-CM | POA: Diagnosis not present

## 2022-06-04 DIAGNOSIS — E782 Mixed hyperlipidemia: Secondary | ICD-10-CM

## 2022-06-04 NOTE — Telephone Encounter (Signed)
Spoke with patient and discussed lab results.  Per Dr. Johney Frame: Cholesterol is very elevated. She has had difficulty tolerating statins. Is she interested in seeing the Pharm D lipid clinic to discuss alternative options for cholesterol lowering?   Patient agreeable to seeing Pharm D in [facility]. Referral ordered.   Patient verbalized understanding of the above.

## 2022-06-04 NOTE — Telephone Encounter (Signed)
-----   Message from Freada Bergeron, MD sent at 06/03/2022  8:22 PM EST ----- Cholesterol is very elevated. She has had difficulty tolerating statins. Is she interested in seeing the Pharm D lipid clinic to discuss alternative options for cholesterol lowering?

## 2022-06-10 DIAGNOSIS — G4733 Obstructive sleep apnea (adult) (pediatric): Secondary | ICD-10-CM | POA: Diagnosis not present

## 2022-06-13 ENCOUNTER — Telehealth: Payer: Self-pay | Admitting: Cardiology

## 2022-06-13 MED ORDER — ALBUTEROL SULFATE HFA 108 (90 BASE) MCG/ACT IN AERS: 2.0000 | INHALATION_SPRAY | Freq: Four times a day (QID) | RESPIRATORY_TRACT | 3 refills | Status: DC | PRN

## 2022-06-13 NOTE — Telephone Encounter (Signed)
*  STAT* If patient is at the pharmacy, call can be transferred to refill team.   1. Which medications need to be refilled? (please list name of each medication and dose if known)   albuterol (VENTOLIN HFA) 108 (90 Base) MCG/ACT inhaler   2. Which pharmacy/location (including street and city if local pharmacy) is medication to be sent to?  [facility] - [place], [place] - [address] AT Portal to Registered [facility] Sites   3. Do they need a 30 day or 90 day supply? 90 day  Patient stated she still a little of this medication left.

## 2022-06-13 NOTE — Telephone Encounter (Signed)
Pt's medication was sent to pt's pharmacy as requested. Confirmation received.  °

## 2022-06-13 NOTE — Telephone Encounter (Signed)
Pt is requesting a refill on albuterol inhaler. Would Dr. Shari Prows like to refill this medicaiton? Please address

## 2022-06-17 DIAGNOSIS — M67962 Unspecified disorder of synovium and tendon, left lower leg: Secondary | ICD-10-CM | POA: Diagnosis not present

## 2022-06-17 DIAGNOSIS — M67961 Unspecified disorder of synovium and tendon, right lower leg: Secondary | ICD-10-CM | POA: Diagnosis not present

## 2022-06-17 DIAGNOSIS — L84 Corns and callosities: Secondary | ICD-10-CM | POA: Diagnosis not present

## 2022-07-01 DIAGNOSIS — R7303 Prediabetes: Secondary | ICD-10-CM | POA: Diagnosis not present

## 2022-07-01 DIAGNOSIS — G4733 Obstructive sleep apnea (adult) (pediatric): Secondary | ICD-10-CM | POA: Diagnosis not present

## 2022-07-01 DIAGNOSIS — R42 Dizziness and giddiness: Secondary | ICD-10-CM | POA: Diagnosis not present

## 2022-07-01 DIAGNOSIS — Z6836 Body mass index (BMI) 36.0-36.9, adult: Secondary | ICD-10-CM | POA: Diagnosis not present

## 2022-07-01 DIAGNOSIS — E669 Obesity, unspecified: Secondary | ICD-10-CM | POA: Diagnosis not present

## 2022-07-02 DIAGNOSIS — M069 Rheumatoid arthritis, unspecified: Secondary | ICD-10-CM | POA: Diagnosis not present

## 2022-07-02 DIAGNOSIS — E039 Hypothyroidism, unspecified: Secondary | ICD-10-CM | POA: Diagnosis not present

## 2022-07-02 DIAGNOSIS — E7800 Pure hypercholesterolemia, unspecified: Secondary | ICD-10-CM | POA: Diagnosis not present

## 2022-07-02 DIAGNOSIS — I10 Essential (primary) hypertension: Secondary | ICD-10-CM | POA: Diagnosis not present

## 2022-07-02 DIAGNOSIS — R7303 Prediabetes: Secondary | ICD-10-CM | POA: Diagnosis not present

## 2022-07-03 DIAGNOSIS — R309 Painful micturition, unspecified: Secondary | ICD-10-CM | POA: Diagnosis not present

## 2022-07-08 DIAGNOSIS — Z0000 Encounter for general adult medical examination without abnormal findings: Secondary | ICD-10-CM | POA: Diagnosis not present

## 2022-07-08 DIAGNOSIS — I700 Atherosclerosis of aorta: Secondary | ICD-10-CM | POA: Diagnosis not present

## 2022-07-08 DIAGNOSIS — G720 Drug-induced myopathy: Secondary | ICD-10-CM | POA: Diagnosis not present

## 2022-07-08 DIAGNOSIS — Z6837 Body mass index (BMI) 37.0-37.9, adult: Secondary | ICD-10-CM | POA: Diagnosis not present

## 2022-07-08 DIAGNOSIS — N1831 Chronic kidney disease, stage 3a: Secondary | ICD-10-CM | POA: Diagnosis not present

## 2022-07-08 DIAGNOSIS — K219 Gastro-esophageal reflux disease without esophagitis: Secondary | ICD-10-CM | POA: Diagnosis not present

## 2022-07-08 DIAGNOSIS — R7303 Prediabetes: Secondary | ICD-10-CM | POA: Diagnosis not present

## 2022-07-08 DIAGNOSIS — E7800 Pure hypercholesterolemia, unspecified: Secondary | ICD-10-CM | POA: Diagnosis not present

## 2022-07-08 DIAGNOSIS — Z1331 Encounter for screening for depression: Secondary | ICD-10-CM | POA: Diagnosis not present

## 2022-07-08 DIAGNOSIS — E039 Hypothyroidism, unspecified: Secondary | ICD-10-CM | POA: Diagnosis not present

## 2022-07-08 DIAGNOSIS — G4733 Obstructive sleep apnea (adult) (pediatric): Secondary | ICD-10-CM | POA: Diagnosis not present

## 2022-07-08 DIAGNOSIS — I10 Essential (primary) hypertension: Secondary | ICD-10-CM | POA: Diagnosis not present

## 2022-07-08 DIAGNOSIS — N390 Urinary tract infection, site not specified: Secondary | ICD-10-CM | POA: Diagnosis not present

## 2022-07-09 ENCOUNTER — Encounter: Payer: Self-pay | Admitting: Psychiatry

## 2022-07-09 ENCOUNTER — Telehealth: Payer: Self-pay | Admitting: Psychiatry

## 2022-07-09 ENCOUNTER — Ambulatory Visit: Payer: Medicare HMO | Admitting: Psychiatry

## 2022-07-09 VITALS — BP 121/81 | HR 80 | Ht 63.0 in | Wt 207.0 lb

## 2022-07-09 DIAGNOSIS — R270 Ataxia, unspecified: Secondary | ICD-10-CM | POA: Diagnosis not present

## 2022-07-09 DIAGNOSIS — R2689 Other abnormalities of gait and mobility: Secondary | ICD-10-CM

## 2022-07-09 DIAGNOSIS — R519 Headache, unspecified: Secondary | ICD-10-CM | POA: Diagnosis not present

## 2022-07-09 NOTE — Progress Notes (Signed)
[facility]  PATIENT: Elizabeth Roman DOB: 12/22/1950  REFERRING CLINICIAN: Meriam Sprague, MD HISTORY FROM: self REASON FOR VISIT: dizziness   HISTORICAL  CHIEF COMPLAINT:  Chief Complaint  Patient presents with   Dizziness    RM 1 alone Pt is well, has been having pain in head and dizziness for at least yr. Cleared by cardio/ENT/pulmonary     HISTORY OF PRESENT ILLNESS:  The patient presents for evaluation of dizziness which began ~1 year ago. Initially this was mild and intermittent. She then had a severe episode in October 2023. At that time she got out of her car and suddenly felt like someone was shoving her backward, then she fell on the driveway and hit her head. She did not seek medical evaluation afterwards.  Currently she has a brief dizzy spell almost daily. These only last for seconds at a time. She is usually standing when these occur. Denies a sensation of spinning, just feels "off-balance" and like she is going to fall. Reports that her feet and hands feel numb. She does have a history of prediabetes and suspects she has neuropathy. She also reports low back pain with occasional sciatica.   She has also developed daily headaches described as sharp pains on the right side of her head. These typically last 5-30 minutes at a time.  She saw ENT in October 2023 who recommended vestibular therapy, which she declined. Hearing test earlier this month was normal. Saw Cardiology in November who did not believe symptoms were cardiac or related to presyncope. Saw Pulmonology who did not believe symptoms were pulmonary related.  She does also report a mechanical fall on 07/2021. Had a CTH at that time which was unremarkable. She had already been getting dizzy spells prior to this fall.  OTHER MEDICAL CONDITIONS: prediabetes, HTN, CAD, OSA   REVIEW OF SYSTEMS: Full 14 system review of systems performed and negative with exception of:  dizziness  ALLERGIES: Allergies  Allergen Reactions   Morphine And Related Anaphylaxis   Prednisone Hives and Other (See Comments)   Atorvastatin Other (See Comments)   Codeine Other (See Comments)    Very ill.   Hydroxychloroquine Sulfate     Other reaction(s): no benefit   Ivp Dye [Iodinated Contrast Media] Hives   Phenobarbital Hives   Propoxyphene    Sulfa Antibiotics Hives   Vicodin [Hydrocodone-Acetaminophen] Other (See Comments)    Very ill.    HOME MEDICATIONS: Outpatient Medications Prior to Visit  Medication Sig Dispense Refill   albuterol (VENTOLIN HFA) 108 (90 Base) MCG/ACT inhaler Inhale 2 puffs into the lungs every 6 (six) hours as needed for wheezing or shortness of breath. 8 g 3   amLODipine (NORVASC) 10 MG tablet Take 1 tablet (10 mg total) by mouth at bedtime. 90 tablet 2   Bromelain 250 MG CAPS Take by mouth daily.     Coenzyme Q10 (CO Q-10) 200 MG CAPS Take 400 mg by mouth at bedtime.     Cranberry 500 MG CAPS Take by mouth. 2 times per day     ferrous sulfate 325 (65 FE) MG tablet daily.     fluticasone (FLONASE) 50 MCG/ACT nasal spray Administer 2 sprays in each nostril daily.     levothyroxine (SYNTHROID, LEVOTHROID) 75 MCG tablet Take 75 mcg by mouth daily before breakfast.     lisinopril (ZESTRIL) 20 MG tablet Take 1 tablet (20 mg total) by mouth daily. 90 tablet 1   MAGNESIUM PO Take 400 mg  by mouth daily.     Multiple Vitamins-Minerals (ANTIOXIDANT PO) Take by mouth. 3 cap fulls daily     OVER THE COUNTER MEDICATION Stool softener 250 mg at bedtime     OVER THE COUNTER MEDICATION Glucosamine 2000mg  daily. Patient stated "taking in powder form"     pantoprazole (PROTONIX) 40 MG tablet Take 40 mg by mouth daily as needed (indigestion).     Turmeric (CURCUMIN 95 PO) Take 1 tablet by mouth 2 (two) times daily.     VITAMIN B COMPLEX-C CAPS Take 1 capsule by mouth 2 (two) times daily.     vitamin C (ASCORBIC ACID) 500 MG tablet Take 500 mg by mouth 2 (two)  times daily.     VITAMIN D PO Take by mouth daily. Per patient taking 5,000 units taking every evening/     zinc gluconate 50 MG tablet Take 50 mg by mouth daily.     No facility-administered medications prior to visit.    PAST MEDICAL HISTORY: Past Medical History:  Diagnosis Date   Arthritis    High cholesterol    Hypertension    Left hip pain 09/2019   Multiple joint pain 03/2020   HANDICAP PLACARD    Sleep apnea    Thyroid disease     PAST SURGICAL HISTORY: Past Surgical History:  Procedure Laterality Date   ABDOMINAL HYSTERECTOMY     BREAST BIOPSY     BUNIONECTOMY     CARPAL TUNNEL RELEASE Left    HAND SURGERY Right    MEDIAL PARTIAL KNEE REPLACEMENT     TONSILLECTOMY      FAMILY HISTORY: Family History  Problem Relation Age of Onset   Breast cancer Mother    Allergies Mother    Asthma Mother    Heart disease Father    Breast cancer Daughter    Cancer Daughter        breast    SOCIAL HISTORY: Social History   Socioeconomic History   Marital status: Married    Spouse name: Not on file   Number of children: 8   Years of education: Not on file   Highest education level: Not on file  Occupational History   Occupation: RETIRED HAIR DRESSER  Tobacco Use   Smoking status: Never   Smokeless tobacco: Never  Vaping Use   Vaping Use: Never used  Substance and Sexual Activity   Alcohol use: Yes    Comment: occ   Drug use: No   Sexual activity: Not on file  Other Topics Concern   Not on file  Social History Narrative   Retired 04/2020    Social Determinants of Interior and spatial designer Strain: Not on Corporate investment banker Insecurity: Not on file  Transportation Needs: Not on file  Physical Activity: Not on file  Stress: Not on file  Social Connections: Not on file  Intimate Partner Violence: Not on file     PHYSICAL EXAM  GENERAL EXAM/CONSTITUTIONAL: Vitals:  Vitals:   07/09/22 1257  BP: 121/81  Pulse: 80  Weight: 207 lb (93.9 kg)  Height:  5\' 3"  (1.6 m)   Body mass index is 36.67 kg/m. Wt Readings from Last 3 Encounters:  07/09/22 207 lb (93.9 kg)  06/03/22 210 lb (95.3 kg)  02/05/22 218 lb (98.9 kg)    NEUROLOGIC: MENTAL STATUS:  awake, alert, oriented to person, place and time recent and remote memory intact normal attention and concentration language fluent, comprehension intact, naming intact fund of knowledge appropriate  CRANIAL NERVE:  2nd, 3rd, 4th, 6th - pupils equal and reactive to light, visual fields full to confrontation, extraocular muscles intact, no nystagmus 5th - facial sensation symmetric 7th - facial strength symmetric 8th - hearing intact 9th - palate elevates symmetrically, uvula midline 11th - shoulder shrug symmetric 12th - tongue protrusion midline  MOTOR:  normal bulk and tone, full strength in the BUE, BLE  SENSORY:  Decreased sensation to vibration in bilateral feet up to knees  COORDINATION:  finger-nose-finger, fine finger movements normal  REFLEXES:  deep tendon reflexes present and symmetric  GAIT/STATION:  normal     DIAGNOSTIC DATA (LABS, IMAGING, TESTING) - I reviewed patient records, labs, notes, testing and imaging myself where available.  Lab Results  Component Value Date   WBC 11.6 (H) 07/06/2020   HGB 13.3 07/06/2020   HCT 42.4 07/06/2020   MCV 92.0 07/06/2020   PLT 248 07/06/2020      Component Value Date/Time   NA 138 11/26/2021 1354   K 4.9 11/26/2021 1354   CL 104 11/26/2021 1354   CO2 20 11/26/2021 1354   GLUCOSE 100 (H) 11/26/2021 1354   GLUCOSE 61 (L) 07/06/2020 1119   BUN 29 (H) 11/26/2021 1354   CREATININE 1.03 (H) 11/26/2021 1354   CALCIUM 9.8 11/26/2021 1354   GFRNONAA >60 07/06/2020 1119   GFRAA >60 09/24/2018 2057   Lab Results  Component Value Date   CHOL 274 (H) 06/03/2022   HDL 60 06/03/2022   LDLCALC 177 (H) 06/03/2022   TRIG 199 (H) 06/03/2022   CHOLHDL 4.6 (H) 06/03/2022   No results found for: "HGBA1C" No results  found for: "VITAMINB12" Lab Results  Component Value Date   TSH 3.45 12/12/2021  A1c 6.0  X-ray lumbar spine 12/2021: 1. 8 mm anterolisthesis of L4 versus L5 compared to 6 mm at this location on the comparison CT scan from May 17, 2021. 2. Degenerative disc disease and facet degenerative changes as above.  ASSESSMENT AND PLAN  71 y.o. year old female with a history of prediabetes, HTN, CAD, OSA who presents for evaluation of imbalance and daily headaches. Will order brain MRI to assess for structural causes of new daily headaches in patient >50. Her exam reveals decreased sensation to vibration over bilateral feet. Dizziness is elicited by Romberg, with no sway. Suspect she has a mild neuropathy contributing to her dizziness/imbalance. This may be secondary to her prediabetes. Will also check B12 levels today. Recommend PT for balance, which she will consider after MRI is done. She does also report lower back pain with sciatica, degenerative changes noted on lumbar X-ray. If no improvement with PT would consider EMG/MRI L-spine.   1. Ataxia       PLAN: -MRI brain -B12 level -Recommend PT for balance. She will consider this after MRI -Next steps: consider EMG, MRI L-spine  Orders Placed This Encounter  Procedures   MR BRAIN W WO CONTRAST   Vitamin B12    No orders of the defined types were placed in this encounter.   Return in about 6 months (around 01/08/2023).    Ocie Doyne, MD 07/09/22 1:42 PM  I spent an average of 50 chart reviewing and counseling the patient, with at least 50% of the time face to face with the patient.   [facility] [address] [place], [place] [address] [phone]

## 2022-07-09 NOTE — Telephone Encounter (Signed)
[employer] medicare sent to [facility] they obtain auth [id number] 

## 2022-07-09 NOTE — Patient Instructions (Signed)
Plan: Blood work to check vitamin B12 level Brain MRI

## 2022-07-10 LAB — VITAMIN B12: Vitamin B-12: 885 pg/mL (ref 232–1245)

## 2022-07-18 ENCOUNTER — Encounter: Payer: Self-pay | Admitting: Psychiatry

## 2022-07-24 ENCOUNTER — Ambulatory Visit
Admission: RE | Admit: 2022-07-24 | Discharge: 2022-07-24 | Disposition: A | Discharge status: Home / Self Care | Payer: Medicare HMO | Source: Ambulatory Visit | Attending: Psychiatry | Admitting: Psychiatry

## 2022-07-24 DIAGNOSIS — R270 Ataxia, unspecified: Secondary | ICD-10-CM

## 2022-07-24 MED ORDER — GADOPICLENOL 0.5 MMOL/ML IV SOLN
10.0000 mL | Freq: Once | INTRAVENOUS | Status: AC | PRN
  Administered 2022-07-24: 10 mL via INTRAVENOUS

## 2022-07-25 NOTE — Progress Notes (Signed)
Patient ID: Elizabeth Roman                 DOB: [birth year]                    MRN: [id number]     HPI: Elizabeth Roman is a 71 y.o. female patient referred to lipid clinic by Dr Shari Prows. PMH is significant for HTN, HLD, fibromyalgia, and OSA. Calcium score 11/01/21 was elevated at 674 (92nd percentile for age and sex). Pt has a history of statin intolerance and was referred to lipid clinic for follow up.  Pt stopped taking her pravastatin and ezetimibe about 2 weeks before she saw Dr Shari Prows last month. Was experiencing cramping in her calves which she's also previously experienced on 3 other statins. Took a while for aches to improve but they have. Does have joint pain at baseline with her arthritis, but statins specifically caused muscle cramping for her. Feeling back to baseline now. Reports TG in the 300s in the past. Eats a heart healthy diet and stays active.   Current Medications: none Intolerances: atorvastatin, simvastatin, rosuvastatin, pravastatin 10mg  daily, ezetimibe 10mg  daily - muscle pain Risk Factors: elevated CAC, FHx CAD, age, HTN LDL goal: 70mg /dL  Diet:  Breakfast - oatmeal with fruit or poached egg. Limiting salt Looks for low carb wraps with higher fiber. Almond milk Lunch/dinner - salad with grilled chicken and vinegar. Likes fish/seafood and chicken, not much other meat. Avoids fried food. Loves vegetables. Drinks mainly water. No added sugar in her drinks.  Exercise: Uses Fitbit, 10-13k steps a day, minimum 7k a day. [facility] and uses machines 1-2x/week.   Family History: Father with CAD (died of MI at 49), paternal uncles and aunts with CAD.   Social History: Occasional alcohol use  Labs: 06/03/22: TC 274, TG 199, HDL 60, LDL 177 (no LLT for about 2 weeks prior)  Past Medical History:  Diagnosis Date   Arthritis    High cholesterol    Hypertension    Left hip pain 09/2019   Multiple joint pain 03/2020   HANDICAP PLACARD    Sleep apnea     Thyroid disease     Current Outpatient Medications on File Prior to Visit  Medication Sig Dispense Refill   albuterol (VENTOLIN HFA) 108 (90 Base) MCG/ACT inhaler Inhale 2 puffs into the lungs every 6 (six) hours as needed for wheezing or shortness of breath. 8 g 3   amLODipine (NORVASC) 10 MG tablet Take 1 tablet (10 mg total) by mouth at bedtime. 90 tablet 2   Bromelain 250 MG CAPS Take by mouth daily.     Coenzyme Q10 (CO Q-10) 200 MG CAPS Take 400 mg by mouth at bedtime.     Cranberry 500 MG CAPS Take by mouth. 2 times per day     ferrous sulfate 325 (65 FE) MG tablet daily.     fluticasone (FLONASE) 50 MCG/ACT nasal spray Administer 2 sprays in each nostril daily.     levothyroxine (SYNTHROID, LEVOTHROID) 75 MCG tablet Take 75 mcg by mouth daily before breakfast.     lisinopril (ZESTRIL) 20 MG tablet Take 1 tablet (20 mg total) by mouth daily. 90 tablet 1   MAGNESIUM PO Take 400 mg by mouth daily.     Multiple Vitamins-Minerals (ANTIOXIDANT PO) Take by mouth. 3 cap fulls daily     OVER THE COUNTER MEDICATION Stool softener 250 mg at bedtime     OVER THE  COUNTER MEDICATION Glucosamine 2000mg  daily. Patient stated "taking in powder form"     pantoprazole (PROTONIX) 40 MG tablet Take 40 mg by mouth daily as needed (indigestion).     Turmeric (CURCUMIN 95 PO) Take 1 tablet by mouth 2 (two) times daily.     VITAMIN B COMPLEX-C CAPS Take 1 capsule by mouth 2 (two) times daily.     vitamin C (ASCORBIC ACID) 500 MG tablet Take 500 mg by mouth 2 (two) times daily.     VITAMIN D PO Take by mouth daily. Per patient taking 5,000 units taking every evening/     zinc gluconate 50 MG tablet Take 50 mg by mouth daily.     No current facility-administered medications on file prior to visit.    Allergies  Allergen Reactions   Morphine And Related Anaphylaxis   Prednisone Hives and Other (See Comments)   Atorvastatin Other (See Comments)   Codeine Other (See Comments)    Very ill.    Hydroxychloroquine Sulfate     Other reaction(s): no benefit   Ivp Dye [Iodinated Contrast Media] Hives   Phenobarbital Hives   Propoxyphene    Sulfa Antibiotics Hives   Vicodin [Hydrocodone-Acetaminophen] Other (See Comments)    Very ill.    Assessment/Plan:  1. Hyperlipidemia - Baseline LDL 177 above goal < 70 due to elevated calcium score. She is intolerant to 4 statins and ezetimibe. Discussed PCSK9i today including expected benefits, side effects, and injection technique. Pt willing to try Repatha. Will submit prior authorization next week - her plan covers Praluent until Jan 1 then changes over to covering Repatha. Will call pt once insurance determination is known, and schedule f/u labs at that time.  Rilyn Scroggs E. Laurana Magistro, PharmD, BCACP, CPP [facility] [address] [place], [place] Phone: [phone]; Fax: [phone] 07/26/2022 11:16 AM

## 2022-07-26 ENCOUNTER — Ambulatory Visit: Payer: Medicare HMO | Attending: Cardiovascular Disease | Admitting: Pharmacist

## 2022-07-26 DIAGNOSIS — R931 Abnormal findings on diagnostic imaging of heart and coronary circulation: Secondary | ICD-10-CM | POA: Diagnosis not present

## 2022-07-26 DIAGNOSIS — E782 Mixed hyperlipidemia: Secondary | ICD-10-CM

## 2022-07-26 NOTE — Patient Instructions (Signed)
Your LDL cholesterol is 177 and your goal is < 70  I will submit information to your insurance for Repatha and let you know when I hear back.    Repatha is a subcutaneous injection given once every 2 weeks in the fatty tissue of your stomach or upper outer thigh. Store the medication in the fridge. You can let your dose warm up to room temperature for 30 minutes before injecting if you prefer. Repatha will lower your LDL cholesterol by 60% and helps to lower your chance of having a heart attack or stroke.

## 2022-07-30 ENCOUNTER — Telehealth: Payer: Self-pay | Admitting: Pharmacist

## 2022-07-30 DIAGNOSIS — E782 Mixed hyperlipidemia: Secondary | ICD-10-CM

## 2022-07-30 MED ORDER — REPATHA SURECLICK 140 MG/ML ~~LOC~~ SOAJ: 1.0000 | SUBCUTANEOUS | 3 refills | Status: DC

## 2022-07-30 NOTE — Telephone Encounter (Signed)
Repatha prior auth submitted since now on formulary and approved through 07/29/23. Rx sent to pharmacy and pt made aware. F/u labs scheduled.

## 2022-07-31 DIAGNOSIS — Z1211 Encounter for screening for malignant neoplasm of colon: Secondary | ICD-10-CM | POA: Diagnosis not present

## 2022-07-31 DIAGNOSIS — R7303 Prediabetes: Secondary | ICD-10-CM | POA: Diagnosis not present

## 2022-07-31 DIAGNOSIS — E669 Obesity, unspecified: Secondary | ICD-10-CM | POA: Diagnosis not present

## 2022-07-31 DIAGNOSIS — I10 Essential (primary) hypertension: Secondary | ICD-10-CM | POA: Diagnosis not present

## 2022-07-31 DIAGNOSIS — Z6836 Body mass index (BMI) 36.0-36.9, adult: Secondary | ICD-10-CM | POA: Diagnosis not present

## 2022-07-31 DIAGNOSIS — Z1212 Encounter for screening for malignant neoplasm of rectum: Secondary | ICD-10-CM | POA: Diagnosis not present

## 2022-08-05 LAB — COLOGUARD: COLOGUARD: NEGATIVE

## 2022-09-02 DIAGNOSIS — I10 Essential (primary) hypertension: Secondary | ICD-10-CM | POA: Diagnosis not present

## 2022-09-02 DIAGNOSIS — R7303 Prediabetes: Secondary | ICD-10-CM | POA: Diagnosis not present

## 2022-09-02 DIAGNOSIS — E669 Obesity, unspecified: Secondary | ICD-10-CM | POA: Diagnosis not present

## 2022-09-02 DIAGNOSIS — Z6836 Body mass index (BMI) 36.0-36.9, adult: Secondary | ICD-10-CM | POA: Diagnosis not present

## 2022-09-10 DIAGNOSIS — N3020 Other chronic cystitis without hematuria: Secondary | ICD-10-CM | POA: Diagnosis not present

## 2022-09-23 DIAGNOSIS — L84 Corns and callosities: Secondary | ICD-10-CM | POA: Diagnosis not present

## 2022-09-23 DIAGNOSIS — M67961 Unspecified disorder of synovium and tendon, right lower leg: Secondary | ICD-10-CM | POA: Diagnosis not present

## 2022-09-23 DIAGNOSIS — M67962 Unspecified disorder of synovium and tendon, left lower leg: Secondary | ICD-10-CM | POA: Diagnosis not present

## 2022-09-24 DIAGNOSIS — R7303 Prediabetes: Secondary | ICD-10-CM | POA: Diagnosis not present

## 2022-09-24 DIAGNOSIS — I10 Essential (primary) hypertension: Secondary | ICD-10-CM | POA: Diagnosis not present

## 2022-09-24 DIAGNOSIS — Z6836 Body mass index (BMI) 36.0-36.9, adult: Secondary | ICD-10-CM | POA: Diagnosis not present

## 2022-09-24 DIAGNOSIS — E669 Obesity, unspecified: Secondary | ICD-10-CM | POA: Diagnosis not present

## 2022-09-27 ENCOUNTER — Encounter: Payer: Self-pay | Admitting: Student

## 2022-09-27 ENCOUNTER — Ambulatory Visit: Payer: Medicare HMO | Admitting: Student

## 2022-09-27 VITALS — BP 134/74 | HR 90 | Temp 98.0°F | Ht 63.0 in | Wt 212.4 lb

## 2022-09-27 DIAGNOSIS — R0600 Dyspnea, unspecified: Secondary | ICD-10-CM | POA: Diagnosis not present

## 2022-09-27 DIAGNOSIS — R053 Chronic cough: Secondary | ICD-10-CM | POA: Diagnosis not present

## 2022-09-27 MED ORDER — BREZTRI AEROSPHERE 160-9-4.8 MCG/ACT IN AERO: 2.0000 | INHALATION_SPRAY | Freq: Two times a day (BID) | RESPIRATORY_TRACT | 0 refills | Status: DC

## 2022-09-27 NOTE — Progress Notes (Signed)
Synopsis: Referred for dyspnea by Wenda Low, MD  Subjective:   PATIENT ID: Elizabeth Roman GENDER: female DOB: August 03, 1950, MRN: [id number]  Chief Complaint  Patient presents with   Follow-up    SOB worse. Dry cough.  Using inhaler more frequently.  Sx increased over last 2 weeks.   72yF with history of HTN, OSA, RA followed by Dr. Trudie Reed referred from cardiology for dyspnea.   Dyspneic since 04/2021, worst when she first wakes up, sometimes feels 'like choking' sensation. Greater dyspnea with activity. Mentions history of severe allergy, better when she heads back to [place] than in [place].  The last bad attack that she has was a week ago where she feels like someone is sitting at top of her chest choking her. She hasn't yet tried albuterol inhaler for it during a bad episode. She has never been on daily inhaler before. Her hoarseness has worsened over last couple of months.   She does have chronic cough but it's not significantly bothersome. She describes it as a frequent throat clearing kind of cough.   She has never seen an allergist. She does have some sinus congestion and occasional postnasal drainage. Does use protonix intermittently for GERD.   She has seen ENT years ago who ordered CPAP for her. She is unsure who she sees for her CPAP now.   She never sleeps without CPAP.   Symptoms did not improve with trial of lasix. She is on ACEi and dose was recently increased.  Cardiology has ordered stress test.   She has no family history of lung disease other than mom who had asbestosis (she was Oncologist)   She worked as a Theme park manager for many years. She is from [place] ([place] She lives in [place] now.   Interval HPI: Saw ENT found to have moderate arytenoid edema on FNL, revisited GERD lifestyle measures and offered referral for stroboscopy/SLP which she declined.   Using inhaler more and more frequently - now several times per day over last couple weeks. Greater  DOE. Wonders if it's related to sea moss gel.   She attributes her cough today to postnasal drainage  Otherwise pertinent review of systems is negative.  Past Medical History:  Diagnosis Date   Arthritis    High cholesterol    Hypertension    Left hip pain 09/2019   Multiple joint pain 03/2020   HANDICAP PLACARD    Sleep apnea    Thyroid disease      Family History  Problem Relation Age of Onset   Breast cancer Mother    Allergies Mother    Asthma Mother    Heart disease Father    Breast cancer Daughter    Cancer Daughter        breast     Past Surgical History:  Procedure Laterality Date   ABDOMINAL HYSTERECTOMY     BREAST BIOPSY     BUNIONECTOMY     CARPAL TUNNEL RELEASE Left    HAND SURGERY Right    MEDIAL PARTIAL KNEE REPLACEMENT     TONSILLECTOMY      Social History   Socioeconomic History   Marital status: Married    Spouse name: Not on file   Number of children: 8   Years of education: Not on file   Highest education level: Not on file  Occupational History   Occupation: RETIRED HAIR DRESSER  Tobacco Use   Smoking status: Never   Smokeless tobacco: Never  Vaping Use  Vaping Use: Never used  Substance and Sexual Activity   Alcohol use: Yes    Comment: occ   Drug use: No   Sexual activity: Not on file  Other Topics Concern   Not on file  Social History Narrative   Retired Theme park manager    Social Determinants of Radio broadcast assistant Strain: Not on file  Food Insecurity: Not on file  Transportation Needs: Not on file  Physical Activity: Not on file  Stress: Not on file  Social Connections: Not on file  Intimate Partner Violence: Not on file     Allergies  Allergen Reactions   Morphine And Related Anaphylaxis   Prednisone Hives and Other (See Comments)   Atorvastatin Other (See Comments)   Codeine Other (See Comments)    Very ill.   Hydroxychloroquine Sulfate     Other reaction(s): no benefit   Ivp Dye [Iodinated Contrast  Media] Hives   Phenobarbital Hives   Propoxyphene    Sulfa Antibiotics Hives   Vicodin [Hydrocodone-Acetaminophen] Other (See Comments)    Very ill.     Outpatient Medications Prior to Visit  Medication Sig Dispense Refill   albuterol (VENTOLIN HFA) 108 (90 Base) MCG/ACT inhaler Inhale 2 puffs into the lungs every 6 (six) hours as needed for wheezing or shortness of breath. 8 g 3   amLODipine (NORVASC) 10 MG tablet Take 1 tablet (10 mg total) by mouth at bedtime. 90 tablet 2   Bromelain 250 MG CAPS Take by mouth daily.     Coenzyme Q10 (CO Q-10) 200 MG CAPS Take 400 mg by mouth at bedtime.     Cranberry 500 MG CAPS Take by mouth. 2 times per day     Evolocumab (REPATHA SURECLICK) [id number] MG/ML SOAJ Inject 140 mg into the skin every 14 (fourteen) days. 6 mL 3   ferrous sulfate 325 (65 FE) MG tablet daily.     fluticasone (FLONASE) 50 MCG/ACT nasal spray Administer 2 sprays in each nostril daily.     levothyroxine (SYNTHROID, LEVOTHROID) 75 MCG tablet Take 75 mcg by mouth daily before breakfast.     lisinopril (ZESTRIL) 20 MG tablet Take 1 tablet (20 mg total) by mouth daily. 90 tablet 1   MAGNESIUM PO Take 400 mg by mouth daily.     methenamine (HIPREX) 1 g tablet Take 1 g by mouth 2 (two) times daily with a meal.     Multiple Vitamins-Minerals (ANTIOXIDANT PO) Take by mouth. 3 cap fulls daily     OVER THE COUNTER MEDICATION Stool softener 250 mg at bedtime     OVER THE COUNTER MEDICATION Glucosamine '2000mg'$  daily. Patient stated "taking in powder form"     pantoprazole (PROTONIX) 40 MG tablet Take 40 mg by mouth daily as needed (indigestion).     Turmeric (CURCUMIN 95 PO) Take 1 tablet by mouth 2 (two) times daily.     VITAMIN B COMPLEX-C CAPS Take 1 capsule by mouth 2 (two) times daily.     vitamin C (ASCORBIC ACID) 500 MG tablet Take 500 mg by mouth 2 (two) times daily.     VITAMIN D PO Take by mouth daily. Per patient taking 5,000 units taking every evening/     zinc gluconate 50 MG  tablet Take 50 mg by mouth daily.     No facility-administered medications prior to visit.       Objective:   Physical Exam:  General appearance: 72 y.o.,, female, NAD, conversant  Eyes: anicteric sclerae; PERRL, tracking  appropriately HENT: NCAT; MMM Neck: Trachea midline; no lymphadenopathy, no JVD Lungs: CTAB, no crackles, no wheeze, with normal respiratory effort CV: RRR, no murmur  Abdomen: Soft, non-tender; non-distended, BS present  Extremities: No peripheral edema, warm Skin: Normal turgor and texture; no rash Psych: Appropriate affect Neuro: Alert and oriented to person and place, no focal deficit     Vitals:   09/27/22 1020  BP: 134/74  Pulse: 90  Temp: 98 F (36.7 C)  TempSrc: Oral  SpO2: 97%  Weight: 212 lb 6.4 oz (96.3 kg)  Height: '5\' 3"'$  (1.6 m)   97% on RA BMI Readings from Last 3 Encounters:  09/27/22 37.62 kg/m  07/09/22 36.67 kg/m  06/03/22 37.20 kg/m   Wt Readings from Last 3 Encounters:  09/27/22 212 lb 6.4 oz (96.3 kg)  07/09/22 207 lb (93.9 kg)  06/03/22 210 lb (95.3 kg)     CBC    Component Value Date/Time   WBC 11.6 (H) 07/06/2020 1119   RBC 4.61 07/06/2020 1119   HGB 13.3 07/06/2020 1119   HCT 42.4 07/06/2020 1119   PLT 248 07/06/2020 1119   MCV 92.0 07/06/2020 1119   MCH 28.9 07/06/2020 1119   MCHC 31.4 07/06/2020 1119   RDW 13.3 07/06/2020 1119   LYMPHSABS 1.5 09/24/2018 2057   MONOABS 0.7 09/24/2018 2057   EOSABS 0.6 (H) 09/24/2018 2057   BASOSABS 0.0 09/24/2018 2057    Eos 600 08/2018  TSH WNL  Chest Imaging: CT Cardiac scoring 11/01/21 reviewed by me with focal osteophyte induced atelectasis and fibrosis RLL, bronchial wall thickening but otherwise unremarkable  Pulmonary Functions Testing Results:    Latest Ref Rng & Units 02/05/2022   10:05 AM  PFT Results  FVC-Pre L 3.07   FVC-Predicted Pre % 113   FVC-Post L 2.93   FVC-Predicted Post % 108   Pre FEV1/FVC % % 69   Post FEV1/FCV % % 73   FEV1-Pre L 2.13    FEV1-Predicted Pre % 104   FEV1-Post L 2.15   DLCO uncorrected ml/min/mmHg 17.78   DLCO UNC% % 97   DLCO corrected ml/min/mmHg 17.78   DLCO COR %Predicted % 97   DLVA Predicted % 80   TLC L 6.23   TLC % Predicted % 129   RV % Predicted % 143   Hyperinflation, borderline for air trapping, otherwise normal   Echocardiogram:   TTE 12/19/21:  1. Left ventricular ejection fraction, by estimation, is 65 to 70%. The  left ventricle has normal function. The left ventricle has no regional  wall motion abnormalities. Left ventricular diastolic parameters are  indeterminate.   2. Right ventricular systolic function is normal. The right ventricular  size is normal.   3. Mild mitral valve regurgitation.   4. The aortic valve is tricuspid. Aortic valve regurgitation is not  visualized. Mild aortic valve sclerosis is present, with no evidence of  aortic valve stenosis.   5. The inferior vena cava is normal in size with greater than 50%  respiratory variability, suggesting right atrial pressure of 3 mmHg.   Ziopatch 10/03/20: Patch wear time was 6 days and 22 hours Predominant rhythm was NSR with average HR 85bpm (ranging from 65-169bpm) 2 episodes of non-sustained SVT with longest lasting 6 beats at rate of 156bpm and fastest lasting 4 beats at rate 169bpm (not associated with patient triggered event) Rare SVEs, rare PVCs <1% No AFib, pauses or sustained arrhythmias    PET/Perfusion 12/18/21: lowo risk Assessment & Plan:   #  Dyspnea # Chronic cough Today emphasizes that dyspnea over last couple weeks is the major issue. She's not substantially bothered by the cough that she attributes to PND (though I also feel irritable larynx/LPR likely also playing role). Family history, episodic nature, eosinophilia, bronchial wall thickening, reported allergy raise concern for asthma but pre/post spiro are WNL. Though less bothersome today, regarding cough, need to also be mindful of her recent ACEi  dose increase. If we can't figure out her dyspnea/cough then we may need to consider alternative antihypertensive.  # OSA on CPAP  Plan: - Would remain on protonix 40 mg daily (or omeprazole or esomeprazole over the counter) - try breztri 2 puffs twice daily need to rinse mouth and brush tongue/teeth after each use. Let me know if you have found this inhaler helpful as you get toward the end of the course and we can try to prescribe this or a similar medication depending on what your insurance prefers.  - albuterol 1-2 puffs as needed and can use 5- 20 minutes before exertion - you will be called to schedule appointment with ENT - see you in 4 months - albuterol prn with spacer, instructed in use - discuss CPAP machine download next vsiit   If she has good response to LABA/ICS I will refer her to allergy since she mentions that she has had hives when given prednisone in past. Seems unlikely to truly have IgE allergy to prednisone but it has been noted in case reports.   RTC 8 weeks  Maryjane Hurter, MD [facility] 09/27/2022 10:39 AM

## 2022-09-27 NOTE — Patient Instructions (Signed)
-   Would remain on protonix 40 mg daily (or omeprazole or esomeprazole over the counter) - try breztri 2 puffs twice daily need to rinse mouth and brush tongue/teeth after each use. Let me know if you have found this inhaler helpful as you get toward the end of the course and we can try to prescribe this or a similar medication depending on what your insurance prefers.  - albuterol 1-2 puffs as needed and can use 5- 20 minutes before exertion - you will be called to schedule appointment with ENT - see you in 4 months

## 2022-10-05 IMAGING — CT CT ABD-PELV W/O CM
2 of 4 series · 16 of 46 positions shown, 18 images · non-contrast
Comparison: None

CLINICAL DATA: Lower abdominal pain.

EXAM:
CT ABDOMEN AND PELVIS WITHOUT CONTRAST
TECHNIQUE: Multidetector CT imaging of the abdomen and pelvis was performed
following the standard protocol without IV contrast.

[Series 2: routine abdomen pelvis without 5.00 br40 s3 axial · axial · non-contrast · 0.80mm/px · z∈[+1209,+1599]mm · 13 of 86 slices shown, 15 images]
[im 4/86  soft-tissue]
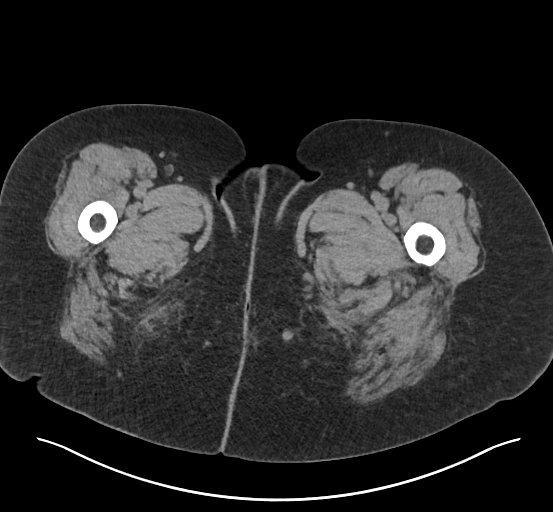
[im 4/86  bone]
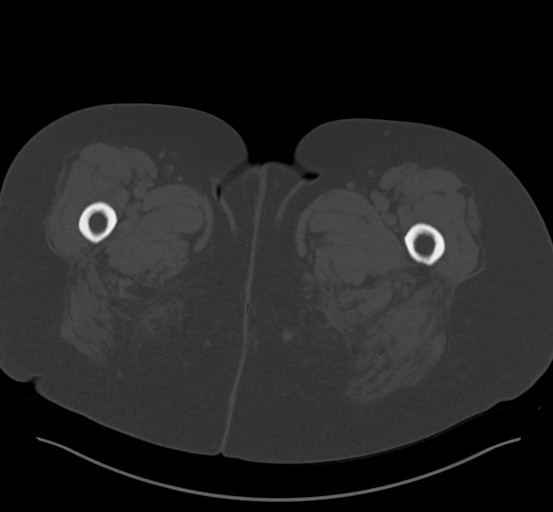
[im 11/86  soft-tissue]
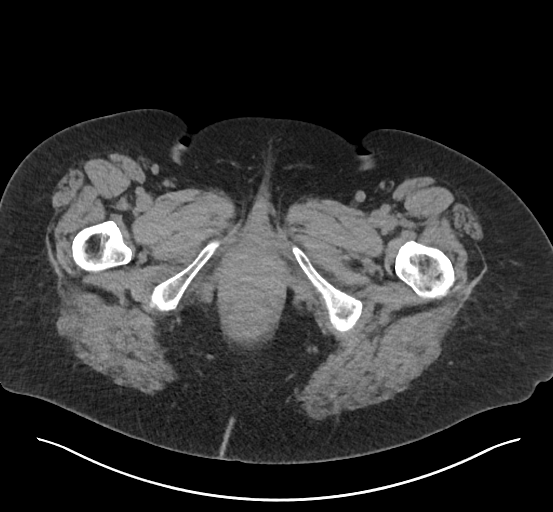
[im 18/86  soft-tissue]
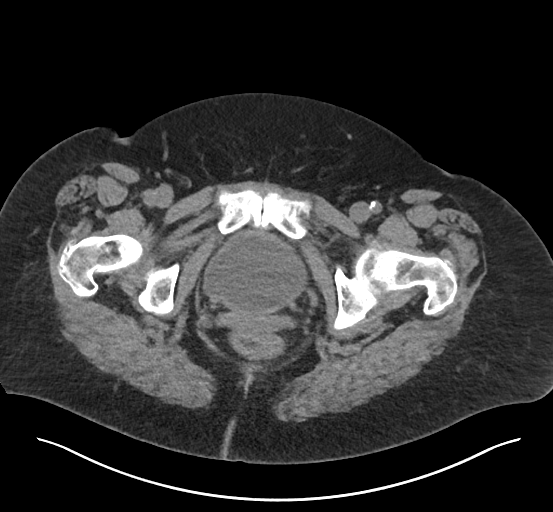
[im 25/86  soft-tissue]
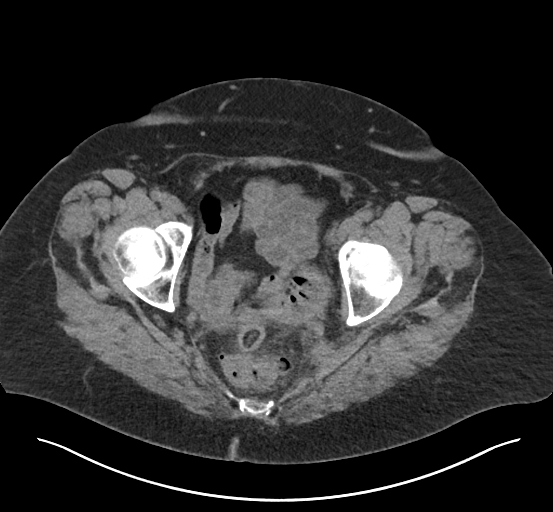
[im 29/86  soft-tissue]
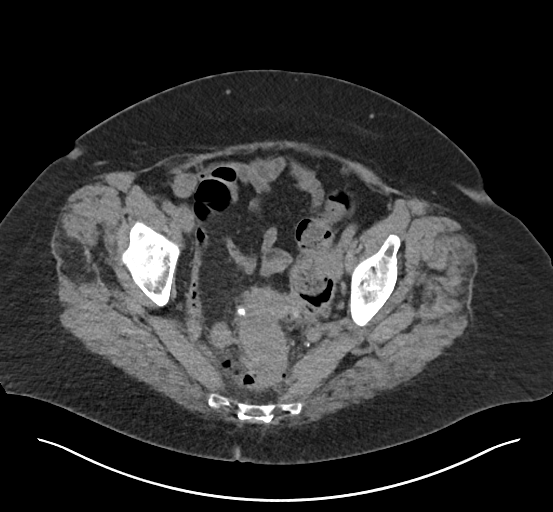
[im 36/86  soft-tissue]
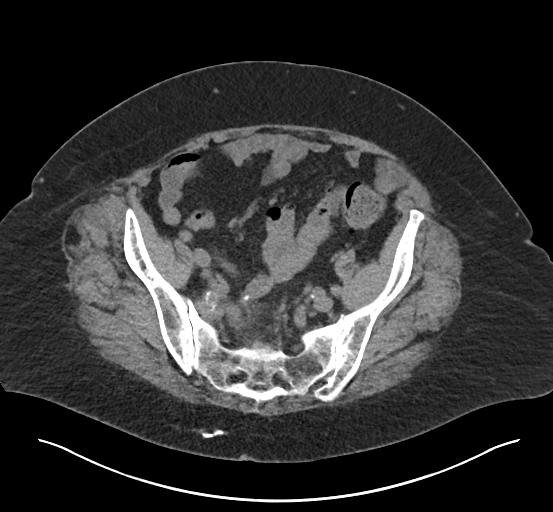
[im 43/86  soft-tissue]
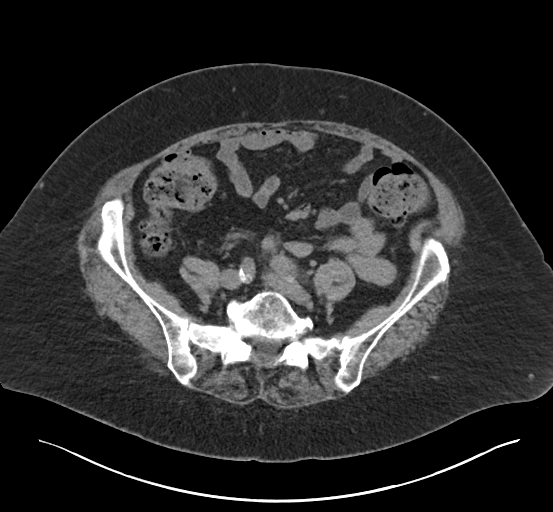
[im 50/86  soft-tissue]
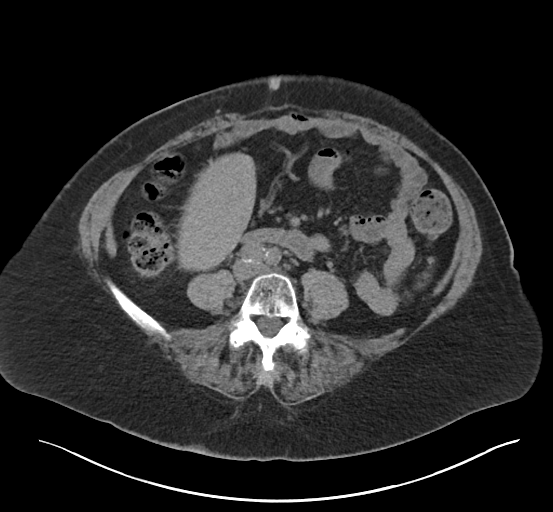
[im 57/86  soft-tissue]
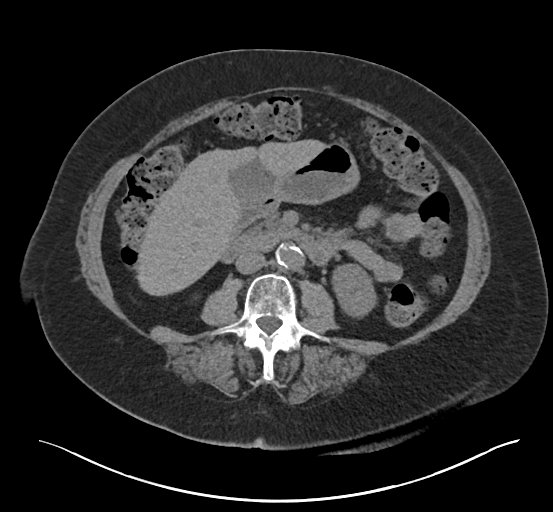
[im 57/86  bone]
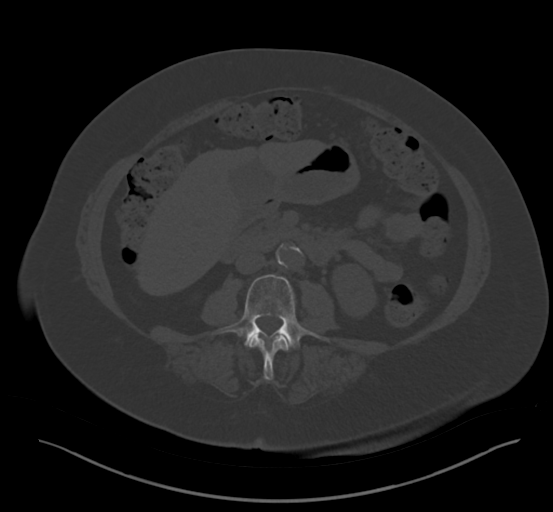
[im 61/86  soft-tissue]
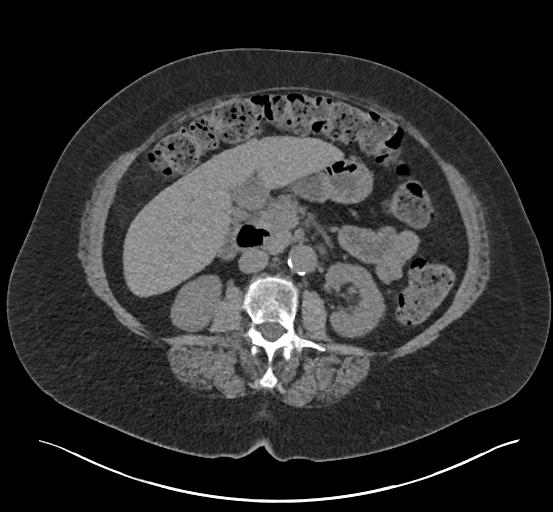
[im 68/86  soft-tissue]
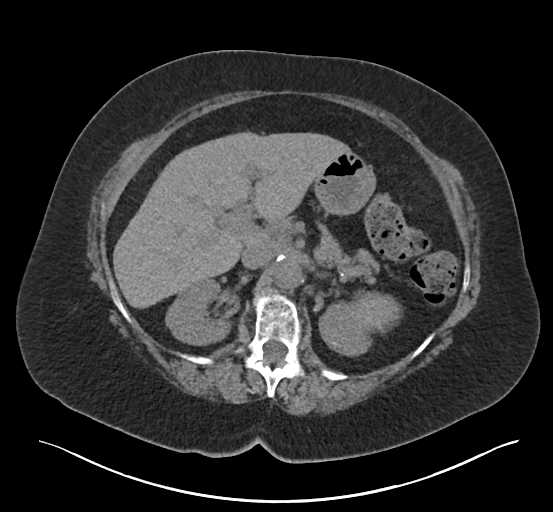
[im 75/86  soft-tissue]
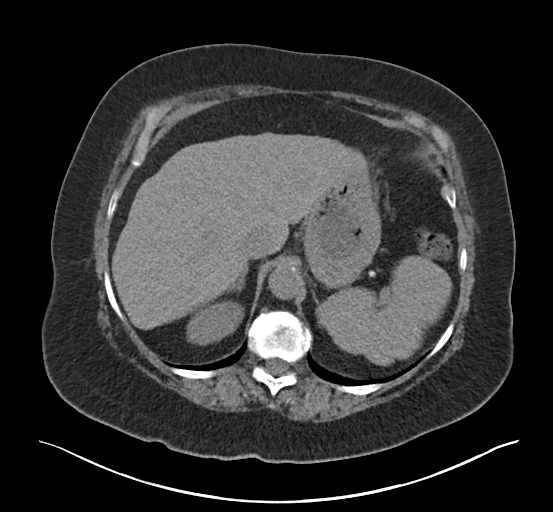
[im 82/86  soft-tissue]
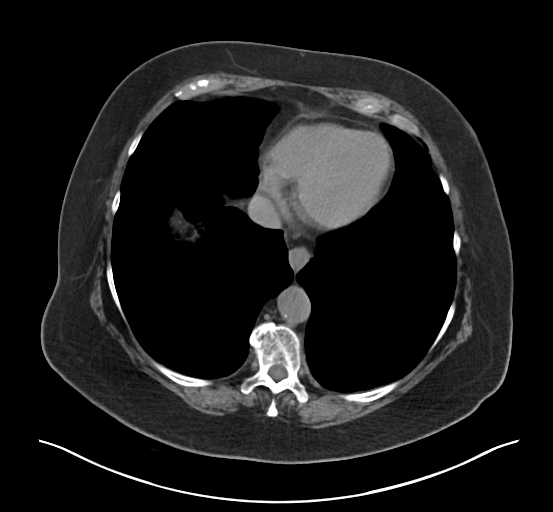

[Series 4: routine abdomen pelvis without 2.00 br40 s3 cor · coronal · non-contrast · 0.83mm/px · 3 of 203 slices shown]
[im 68/203  soft-tissue]
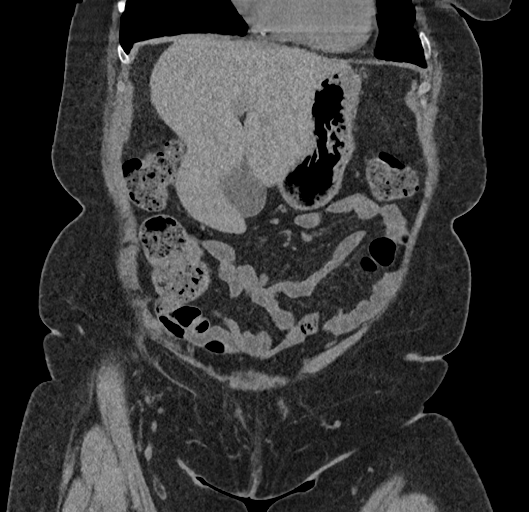
[im 90/203  soft-tissue]
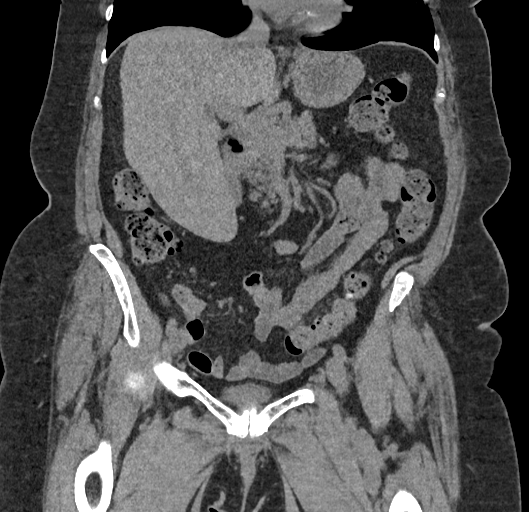
[im 113/203  soft-tissue]
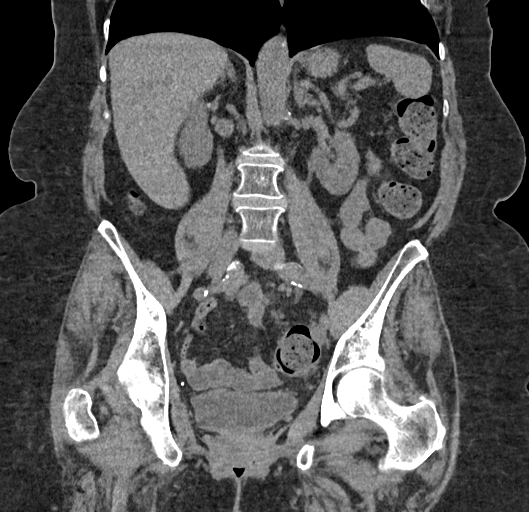

[16 of 46 positions shown; findings below may reference images not displayed]

FINDINGS: Lower chest: Lung bases are clear.

Hepatobiliary: Normal appearance of the liver and gallbladder.

Pancreas: Unremarkable. No pancreatic ductal dilatation or
surrounding inflammatory changes.

Spleen: Normal in size without focal abnormality.

Adrenals/Urinary Tract: Normal adrenals. Normal appearance of both
kidneys without stones or hydronephrosis. Normal appearance of the
urinary bladder.

Stomach/Bowel: Colonic diverticula involving the distal sigmoid
colon with pericolonic stranding. No evidence for an extraluminal
fluid collection around the sigmoid colon. Low-density structure
adjacent to the distal sigmoid colon appears to contain fat. This
could be associated with a granuloma or possibly a lipoma and
measures up to 2.1 cm on sequence 2 image 62. No evidence for a
bowel obstruction. Stomach is normal except for a small hiatal
hernia.

Vascular/Lymphatic: Aortic atherosclerosis. No enlarged abdominal or
pelvic lymph nodes.

Reproductive: Status post hysterectomy. No adnexal masses.

Other: Negative for free fluid.  Negative for free air.

Musculoskeletal: Grade 1 anterolisthesis of L4 on L5 likely
secondary to facet arthropathy. Disc space narrowing in the lower
thoracic spine. Disc space narrowing at L5-S1.
IMPRESSION: 1. Inflammation centered around the distal sigmoid colon. Findings
are suggestive for acute diverticulitis. No evidence for an
extraluminal fluid collection or abscess. There is a low-density
structure around the sigmoid colon which could be related to a
diverticulum, granuloma or a lipoma. Consider follow-up imaging to
ensure resolution of the inflammatory changes and/or follow-up
colonoscopy.
2.  Aortic Atherosclerosis ([id number]).

These results will be called to the ordering clinician or
representative by the Radiologist Assistant, and communication
documented in the PACS or [REDACTED].

## 2022-10-07 ENCOUNTER — Ambulatory Visit: Payer: Medicare HMO | Attending: Cardiology

## 2022-10-07 DIAGNOSIS — E782 Mixed hyperlipidemia: Secondary | ICD-10-CM

## 2022-10-07 LAB — LIPID PANEL
Chol/HDL Ratio: 2.7 ratio (ref 0.0–4.4)
Cholesterol, Total: 161 mg/dL (ref 100–199)
HDL: 60 mg/dL (ref >39)
LDL Chol Calc (NIH): 71 mg/dL (ref 0–99)
Triglycerides: 184 mg/dL — ABNORMAL HIGH (ref 0–149)
VLDL Cholesterol Cal: 30 mg/dL (ref 5–40)

## 2022-10-07 LAB — HEPATIC FUNCTION PANEL
ALT: 16 IU/L (ref 0–32)
AST: 16 IU/L (ref 0–40)
Albumin: 4.7 g/dL (ref 3.8–4.8)
Alkaline Phosphatase: 90 IU/L (ref 44–121)
Bilirubin Total: 0.5 mg/dL (ref 0.0–1.2)
Bilirubin, Direct: 0.14 mg/dL (ref 0.00–0.40)
Total Protein: 6.9 g/dL (ref 6.0–8.5)

## 2022-10-09 DIAGNOSIS — Z6836 Body mass index (BMI) 36.0-36.9, adult: Secondary | ICD-10-CM | POA: Diagnosis not present

## 2022-10-09 DIAGNOSIS — R7303 Prediabetes: Secondary | ICD-10-CM | POA: Diagnosis not present

## 2022-10-09 DIAGNOSIS — I10 Essential (primary) hypertension: Secondary | ICD-10-CM | POA: Diagnosis not present

## 2022-10-09 DIAGNOSIS — E669 Obesity, unspecified: Secondary | ICD-10-CM | POA: Diagnosis not present

## 2022-10-10 ENCOUNTER — Other Ambulatory Visit: Payer: Self-pay

## 2022-10-10 MED ORDER — ALBUTEROL SULFATE HFA 108 (90 BASE) MCG/ACT IN AERS: 2.0000 | INHALATION_SPRAY | Freq: Four times a day (QID) | RESPIRATORY_TRACT | 3 refills | Status: AC | PRN

## 2022-10-10 NOTE — Telephone Encounter (Signed)
Pt's pharmacy is requesting a refill on albuterol inhaler. Would Dr. Johney Frame like to refill this medication? Please address

## 2022-10-21 DIAGNOSIS — R7303 Prediabetes: Secondary | ICD-10-CM | POA: Diagnosis not present

## 2022-10-21 DIAGNOSIS — E669 Obesity, unspecified: Secondary | ICD-10-CM | POA: Diagnosis not present

## 2022-10-21 DIAGNOSIS — I10 Essential (primary) hypertension: Secondary | ICD-10-CM | POA: Diagnosis not present

## 2022-10-21 DIAGNOSIS — Z6837 Body mass index (BMI) 37.0-37.9, adult: Secondary | ICD-10-CM | POA: Diagnosis not present

## 2022-10-24 DIAGNOSIS — N3020 Other chronic cystitis without hematuria: Secondary | ICD-10-CM | POA: Diagnosis not present

## 2022-10-24 DIAGNOSIS — R300 Dysuria: Secondary | ICD-10-CM | POA: Diagnosis not present

## 2022-11-19 DIAGNOSIS — I10 Essential (primary) hypertension: Secondary | ICD-10-CM | POA: Diagnosis not present

## 2022-11-19 DIAGNOSIS — Z6837 Body mass index (BMI) 37.0-37.9, adult: Secondary | ICD-10-CM | POA: Diagnosis not present

## 2022-11-19 DIAGNOSIS — E669 Obesity, unspecified: Secondary | ICD-10-CM | POA: Diagnosis not present

## 2022-11-19 DIAGNOSIS — R7303 Prediabetes: Secondary | ICD-10-CM | POA: Diagnosis not present

## 2022-12-03 DIAGNOSIS — Z6837 Body mass index (BMI) 37.0-37.9, adult: Secondary | ICD-10-CM | POA: Diagnosis not present

## 2022-12-03 DIAGNOSIS — R7303 Prediabetes: Secondary | ICD-10-CM | POA: Diagnosis not present

## 2022-12-03 DIAGNOSIS — E669 Obesity, unspecified: Secondary | ICD-10-CM | POA: Diagnosis not present

## 2022-12-03 DIAGNOSIS — I10 Essential (primary) hypertension: Secondary | ICD-10-CM | POA: Diagnosis not present

## 2022-12-10 ENCOUNTER — Telehealth: Payer: Self-pay | Admitting: Student

## 2022-12-10 ENCOUNTER — Other Ambulatory Visit: Payer: Self-pay

## 2022-12-10 MED ORDER — BREZTRI AEROSPHERE 160-9-4.8 MCG/ACT IN AERO: 2.0000 | INHALATION_SPRAY | Freq: Two times a day (BID) | RESPIRATORY_TRACT | 6 refills | Status: DC

## 2022-12-10 NOTE — Telephone Encounter (Signed)
Spoke with patient. She advised Markus Daft has worked really well for her and is requesting a script to be sent to her pharmacy. I have sent prescription. Routing to Dr. Thora Lance as an Lorain Childes

## 2022-12-10 NOTE — Telephone Encounter (Signed)
Patient requesting refill of Breztri. States sample worked really well for her. Script has been sent to pharmacy. Routing to Dr. Thora Lance as an Lorain Childes

## 2022-12-10 NOTE — Telephone Encounter (Signed)
Patient states needs new RX for [employer]. Pharmacy is [facility] [place]. Patient phone number is [phone].

## 2022-12-30 DIAGNOSIS — N3020 Other chronic cystitis without hematuria: Secondary | ICD-10-CM | POA: Diagnosis not present

## 2022-12-31 DIAGNOSIS — E669 Obesity, unspecified: Secondary | ICD-10-CM | POA: Diagnosis not present

## 2022-12-31 DIAGNOSIS — R7303 Prediabetes: Secondary | ICD-10-CM | POA: Diagnosis not present

## 2022-12-31 DIAGNOSIS — I700 Atherosclerosis of aorta: Secondary | ICD-10-CM | POA: Diagnosis not present

## 2022-12-31 DIAGNOSIS — Z6837 Body mass index (BMI) 37.0-37.9, adult: Secondary | ICD-10-CM | POA: Diagnosis not present

## 2022-12-31 DIAGNOSIS — I10 Essential (primary) hypertension: Secondary | ICD-10-CM | POA: Diagnosis not present

## 2023-01-03 DIAGNOSIS — E039 Hypothyroidism, unspecified: Secondary | ICD-10-CM | POA: Diagnosis not present

## 2023-01-03 DIAGNOSIS — R7303 Prediabetes: Secondary | ICD-10-CM | POA: Diagnosis not present

## 2023-01-03 DIAGNOSIS — N1831 Chronic kidney disease, stage 3a: Secondary | ICD-10-CM | POA: Diagnosis not present

## 2023-01-06 DIAGNOSIS — M069 Rheumatoid arthritis, unspecified: Secondary | ICD-10-CM | POA: Diagnosis not present

## 2023-01-06 DIAGNOSIS — I10 Essential (primary) hypertension: Secondary | ICD-10-CM | POA: Diagnosis not present

## 2023-01-06 DIAGNOSIS — I700 Atherosclerosis of aorta: Secondary | ICD-10-CM | POA: Diagnosis not present

## 2023-01-06 DIAGNOSIS — G4733 Obstructive sleep apnea (adult) (pediatric): Secondary | ICD-10-CM | POA: Diagnosis not present

## 2023-01-06 DIAGNOSIS — N1831 Chronic kidney disease, stage 3a: Secondary | ICD-10-CM | POA: Diagnosis not present

## 2023-01-06 DIAGNOSIS — R7303 Prediabetes: Secondary | ICD-10-CM | POA: Diagnosis not present

## 2023-01-06 DIAGNOSIS — R053 Chronic cough: Secondary | ICD-10-CM | POA: Diagnosis not present

## 2023-01-06 DIAGNOSIS — E039 Hypothyroidism, unspecified: Secondary | ICD-10-CM | POA: Diagnosis not present

## 2023-01-06 DIAGNOSIS — G720 Drug-induced myopathy: Secondary | ICD-10-CM | POA: Diagnosis not present

## 2023-01-16 DIAGNOSIS — R7303 Prediabetes: Secondary | ICD-10-CM | POA: Diagnosis not present

## 2023-01-16 DIAGNOSIS — I10 Essential (primary) hypertension: Secondary | ICD-10-CM | POA: Diagnosis not present

## 2023-01-16 DIAGNOSIS — E669 Obesity, unspecified: Secondary | ICD-10-CM | POA: Diagnosis not present

## 2023-01-16 DIAGNOSIS — I700 Atherosclerosis of aorta: Secondary | ICD-10-CM | POA: Diagnosis not present

## 2023-01-16 DIAGNOSIS — Z6837 Body mass index (BMI) 37.0-37.9, adult: Secondary | ICD-10-CM | POA: Diagnosis not present

## 2023-01-20 DIAGNOSIS — E7800 Pure hypercholesterolemia, unspecified: Secondary | ICD-10-CM | POA: Diagnosis not present

## 2023-01-23 ENCOUNTER — Ambulatory Visit: Payer: Medicare HMO | Admitting: Family Medicine

## 2023-01-31 DIAGNOSIS — M67961 Unspecified disorder of synovium and tendon, right lower leg: Secondary | ICD-10-CM | POA: Diagnosis not present

## 2023-01-31 DIAGNOSIS — L84 Corns and callosities: Secondary | ICD-10-CM | POA: Diagnosis not present

## 2023-01-31 DIAGNOSIS — M67962 Unspecified disorder of synovium and tendon, left lower leg: Secondary | ICD-10-CM | POA: Diagnosis not present

## 2023-02-06 DIAGNOSIS — I700 Atherosclerosis of aorta: Secondary | ICD-10-CM | POA: Diagnosis not present

## 2023-02-06 DIAGNOSIS — I10 Essential (primary) hypertension: Secondary | ICD-10-CM | POA: Diagnosis not present

## 2023-02-06 DIAGNOSIS — R7303 Prediabetes: Secondary | ICD-10-CM | POA: Diagnosis not present

## 2023-02-06 DIAGNOSIS — E669 Obesity, unspecified: Secondary | ICD-10-CM | POA: Diagnosis not present

## 2023-02-06 DIAGNOSIS — Z6837 Body mass index (BMI) 37.0-37.9, adult: Secondary | ICD-10-CM | POA: Diagnosis not present

## 2023-02-17 DIAGNOSIS — M25562 Pain in left knee: Secondary | ICD-10-CM | POA: Diagnosis not present

## 2023-02-17 DIAGNOSIS — M25561 Pain in right knee: Secondary | ICD-10-CM | POA: Diagnosis not present

## 2023-02-20 DIAGNOSIS — I10 Essential (primary) hypertension: Secondary | ICD-10-CM | POA: Diagnosis not present

## 2023-02-20 DIAGNOSIS — R059 Cough, unspecified: Secondary | ICD-10-CM | POA: Diagnosis not present

## 2023-02-27 ENCOUNTER — Other Ambulatory Visit (HOSPITAL_COMMUNITY): Payer: Self-pay

## 2023-02-27 DIAGNOSIS — I700 Atherosclerosis of aorta: Secondary | ICD-10-CM | POA: Diagnosis not present

## 2023-02-27 DIAGNOSIS — E669 Obesity, unspecified: Secondary | ICD-10-CM | POA: Diagnosis not present

## 2023-02-27 DIAGNOSIS — R7303 Prediabetes: Secondary | ICD-10-CM | POA: Diagnosis not present

## 2023-02-27 DIAGNOSIS — I10 Essential (primary) hypertension: Secondary | ICD-10-CM | POA: Diagnosis not present

## 2023-02-27 DIAGNOSIS — Z6837 Body mass index (BMI) 37.0-37.9, adult: Secondary | ICD-10-CM | POA: Diagnosis not present

## 2023-02-27 MED ORDER — WEGOVY 0.5 MG/0.5ML ~~LOC~~ SOAJ
0.5000 mg | SUBCUTANEOUS | 0 refills | Status: DC
  Filled 2023-02-27: qty 2, 28d supply, fill #0

## 2023-03-14 ENCOUNTER — Ambulatory Visit: Payer: Medicare HMO | Admitting: Primary Care

## 2023-03-14 ENCOUNTER — Encounter: Payer: Self-pay | Admitting: Primary Care

## 2023-03-14 VITALS — BP 110/70 | HR 95 | Temp 97.2°F | Ht 63.0 in | Wt 213.0 lb

## 2023-03-14 DIAGNOSIS — R053 Chronic cough: Secondary | ICD-10-CM | POA: Diagnosis not present

## 2023-03-14 DIAGNOSIS — G4730 Sleep apnea, unspecified: Secondary | ICD-10-CM

## 2023-03-14 MED ORDER — BUDESONIDE-FORMOTEROL FUMARATE 80-4.5 MCG/ACT IN AERO: 2.0000 | INHALATION_SPRAY | Freq: Two times a day (BID) | RESPIRATORY_TRACT | 3 refills | Status: DC

## 2023-03-14 NOTE — Patient Instructions (Addendum)
PET scan in 2023 showed clear lungs.  Recommend getting chest x-ray to acute bronchitis symptoms and back pain  Recommendations: Continue Breztri until finished; then start Symbicort 1-2 puffs morning and evening   ORDERS: CXR   Follow-up: 6 months with Dr. Francine Graven or Dr. Celine Mans (30 min visit-former Thora Lance) or sooner if needed

## 2023-03-14 NOTE — Progress Notes (Unsigned)
@Patient  ID: Elizabeth Roman, female    DOB: 29-Mar-1951, 72 y.o.   MRN: [id number]  Chief Complaint  Patient presents with   Follow-up    OSA on CPAP    Referring provider: Georgann Housekeeper, MD  HPI: 72 year old female, never smoked.  Past medical history significant for hypertension, sleep apnea on CPAP, GERD, fibromyalgia, hyperlipidemia, obesity.  03/14/2023 Patient presents today for follow-up OSA.  Former patient of Dr. Thora Lance, she was last seen in March for dyspnea. She was started on trial of Breztri which she feels has helped her shortness of breath and cough, however, she cannot afford inhaler. She has been using Breztri every morning. Occasionally she has wheezing symptoms. She was taken off lisinopril which has helped her cough. She has been bringing up clear mucus. She experiences pain to her right upper back when laying on her side at night,  no pain during the day.  Her mother had asthma.   Airview download 02/11/2023 - 03/12/2023 Usage days 30/30 days (100%); 29 days (97%) greater than 4 hours Average usage 7 hours 25 minutes Pressures 8 cm H2O Air leaks 47.4 L/min (95%) AHI 1.5  Allergies  Allergen Reactions   Morphine And Codeine Anaphylaxis   Prednisone Hives and Other (See Comments)   Atorvastatin Other (See Comments)   Codeine Other (See Comments)    Very ill.   Hydroxychloroquine Sulfate     Other reaction(s): no benefit   Ivp Dye [Iodinated Contrast Media] Hives   Phenobarbital Hives   Propoxyphene    Sulfa Antibiotics Hives   Vicodin [Hydrocodone-Acetaminophen] Other (See Comments)    Very ill.     There is no immunization history on file for this patient.  Past Medical History:  Diagnosis Date   Arthritis    High cholesterol    Hypertension    Left hip pain 09/2019   Multiple joint pain 03/2020   HANDICAP PLACARD    Sleep apnea    Thyroid disease     Tobacco History: Social History   Tobacco Use  Smoking Status Never  Smokeless Tobacco Never    Counseling given: Not Answered   Outpatient Medications Prior to Visit  Medication Sig Dispense Refill   albuterol (VENTOLIN HFA) 108 (90 Base) MCG/ACT inhaler Inhale 2 puffs into the lungs every 6 (six) hours as needed for wheezing or shortness of breath. 8 g 3   amLODipine (NORVASC) 10 MG tablet Take 1 tablet (10 mg total) by mouth at bedtime. 90 tablet 2   Bromelain 250 MG CAPS Take by mouth daily.     Coenzyme Q10 (CO Q-10) 200 MG CAPS Take 400 mg by mouth at bedtime.     Cranberry 500 MG CAPS Take by mouth. 2 times per day     Evolocumab (REPATHA SURECLICK) 140 MG/ML SOAJ Inject 140 mg into the skin every 14 (fourteen) days. 6 mL 3   ferrous sulfate 325 (65 FE) MG tablet daily.     fluticasone (FLONASE) 50 MCG/ACT nasal spray Administer 2 sprays in each nostril daily.     levothyroxine (SYNTHROID, LEVOTHROID) 75 MCG tablet Take 75 mcg by mouth daily before breakfast.     losartan (COZAAR) 100 MG tablet 1 tablet Orally Once a day for 90 days     MAGNESIUM PO Take 400 mg by mouth daily.     Multiple Vitamins-Minerals (ANTIOXIDANT PO) Take by mouth. 3 cap fulls daily     OVER THE COUNTER MEDICATION Stool softener 250 mg at bedtime  OVER THE COUNTER MEDICATION Glucosamine 2000mg  daily. Patient stated "taking in powder form"     pantoprazole (PROTONIX) 40 MG tablet Take 40 mg by mouth daily as needed (indigestion).     Semaglutide-Weight Management (WEGOVY) 0.5 MG/0.5ML SOAJ Inject 0.5 mg into the skin every 7 (seven) days. 2 mL 0   Turmeric (CURCUMIN 95 PO) Take 1 tablet by mouth 2 (two) times daily.     VITAMIN B COMPLEX-C CAPS Take 1 capsule by mouth 2 (two) times daily.     vitamin C (ASCORBIC ACID) 500 MG tablet Take 500 mg by mouth 2 (two) times daily.     VITAMIN D PO Take by mouth daily. Per patient taking 5,000 units taking every evening/     zinc gluconate 50 MG tablet Take 50 mg by mouth daily.     Budeson-Glycopyrrol-Formoterol (BREZTRI AEROSPHERE) 160-9-4.8 MCG/ACT AERO  Inhale 2 puffs into the lungs in the morning and at bedtime. 1 each 6   lisinopril (ZESTRIL) 20 MG tablet Take 1 tablet (20 mg total) by mouth daily. (Patient not taking: Reported on 03/14/2023) 90 tablet 1   methenamine (HIPREX) 1 g tablet Take 1 g by mouth 2 (two) times daily with a meal. (Patient not taking: Reported on 03/14/2023)     No facility-administered medications prior to visit.   Review of Systems  Review of Systems  Constitutional: Negative.   HENT: Negative.    Respiratory:  Positive for cough. Negative for chest tightness, shortness of breath and wheezing.   Cardiovascular: Negative.   Musculoskeletal:  Positive for back pain.    Physical Exam  BP 110/70 (BP Location: Left Arm, Patient Position: Sitting, Cuff Size: Normal)   Pulse 95   Temp (!) 97.2 F (36.2 C) (Temporal)   Ht 5\' 3"  (1.6 m)   Wt 213 lb (96.6 kg)   SpO2 97%   BMI 37.73 kg/m  Physical Exam Constitutional:      Appearance: Normal appearance.  HENT:     Head: Normocephalic and atraumatic.  Cardiovascular:     Rate and Rhythm: Normal rate and regular rhythm.  Pulmonary:     Effort: Pulmonary effort is normal.     Breath sounds: Normal breath sounds.  Musculoskeletal:        General: Normal range of motion.  Skin:    General: Skin is warm and dry.  Neurological:     General: No focal deficit present.     Mental Status: She is alert and oriented to person, place, and time. Mental status is at baseline.      Lab Results:  CBC    Component Value Date/Time   WBC 11.6 (H) 07/06/2020 1119   RBC 4.61 07/06/2020 1119   HGB 13.3 07/06/2020 1119   HCT 42.4 07/06/2020 1119   PLT 248 07/06/2020 1119   MCV 92.0 07/06/2020 1119   MCH 28.9 07/06/2020 1119   MCHC 31.4 07/06/2020 1119   RDW 13.3 07/06/2020 1119   LYMPHSABS 1.5 09/24/2018 2057   MONOABS 0.7 09/24/2018 2057   EOSABS 0.6 (H) 09/24/2018 2057   BASOSABS 0.0 09/24/2018 2057    BMET    Component Value Date/Time   NA 138  11/26/2021 1354   K 4.9 11/26/2021 1354   CL 104 11/26/2021 1354   CO2 20 11/26/2021 1354   GLUCOSE 100 (H) 11/26/2021 1354   GLUCOSE 61 (L) 07/06/2020 1119   BUN 29 (H) 11/26/2021 1354   CREATININE 1.03 (H) 11/26/2021 1354   CALCIUM 9.8  11/26/2021 1354   GFRNONAA >60 07/06/2020 1119   GFRAA >60 09/24/2018 2057    BNP No results found for: "BNP"  ProBNP    Component Value Date/Time   PROBNP 114 11/09/2021 1251    Imaging: No results found.   Assessment & Plan:   Asthma - Dyspnea and cough have improved with trial Breztri as well as being off ACEi. Associated cough and intermittent wheezing symptoms. She can not afford Breztri. She has been using maintenance once a day, Recommend de-escalating therapy to Symbicort two puffs twice daily. Checking CXR due to bronchitis symptoms and back pain while laying flat.   Sleep apnea with use of continuous positive airway pressure (CPAP) - Patient is 97% compliant with CPAP use >4 hour last 30 days. Current pressure 8cm h20; Residual AHI 1.5/hour. No changes. Encourage patient continue to wear nightly 4-6 hours or longer. Focus on side sleeping position and weight loss.    Glenford Bayley, NP 03/16/2023

## 2023-03-16 DIAGNOSIS — J45909 Unspecified asthma, uncomplicated: Secondary | ICD-10-CM | POA: Insufficient documentation

## 2023-03-16 NOTE — Assessment & Plan Note (Addendum)
-   Dyspnea and cough have improved with trial Breztri as well as being off ACEi. She has intermittent wheezing symptoms. She can not afford Breztri. She has been using maintenance once a day. Recommend de-escalating therapy to Symbicort two puffs twice daily. Checking CXR due to bronchitis symptoms and back pain while laying flat.

## 2023-03-16 NOTE — Assessment & Plan Note (Signed)
-   Patient is 97% compliant with CPAP use >4 hour last 30 days. Current pressure 8cm h20; Residual AHI 1.5/hour. No changes. Encourage patient continue to wear nightly 4-6 hours or longer. Focus on side sleeping position and weight loss.

## 2023-03-17 DIAGNOSIS — I10 Essential (primary) hypertension: Secondary | ICD-10-CM | POA: Diagnosis not present

## 2023-03-19 NOTE — Progress Notes (Unsigned)
No chief complaint on file.   HISTORY OF PRESENT ILLNESS:  03/19/23 ALL:  Elizabeth Roman is a 72 y.o. female here today for follow up for headaches and imbalance. She was seen in consult with Dr Delena Bali 06/2022. She complained of sharp stabbing headaches and intermittent dizziness. Exam showed dizziness elicited by Romberg with no sway and decreased vibratory sensation of bilateral feet. MRI was unremarkable. PT advised. Since,    HISTORY (copied from Dr Quentin Mulling previous note)  The patient presents for evaluation of dizziness which began ~1 year ago. Initially this was mild and intermittent. She then had a severe episode in October 2023. At that time she got out of her car and suddenly felt like someone was shoving her backward, then she fell on the driveway and hit her head. She did not seek medical evaluation afterwards.   Currently she has a brief dizzy spell almost daily. These only last for seconds at a time. She is usually standing when these occur. Denies a sensation of spinning, just feels "off-balance" and like she is going to fall. Reports that her feet and hands feel numb. She does have a history of prediabetes and suspects she has neuropathy. She also reports low back pain with occasional sciatica.    She has also developed daily headaches described as sharp pains on the right side of her head. These typically last 5-30 minutes at a time.   She saw ENT in October 2023 who recommended vestibular therapy, which she declined. Hearing test earlier this month was normal. Saw Cardiology in November who did not believe symptoms were cardiac or related to presyncope. Saw Pulmonology who did not believe symptoms were pulmonary related.   She does also report a mechanical fall on 07/2021. Had a CTH at that time which was unremarkable. She had already been getting dizzy spells prior to this fall.   OTHER MEDICAL CONDITIONS: prediabetes, HTN, CAD, OSA   REVIEW OF SYSTEMS: Out of a complete 14  system review of symptoms, the patient complains only of the following symptoms, and all other reviewed systems are negative.   ALLERGIES: Allergies  Allergen Reactions   Morphine And Codeine Anaphylaxis   Prednisone Hives and Other (See Comments)   Atorvastatin Other (See Comments)   Codeine Other (See Comments)    Very ill.   Hydroxychloroquine Sulfate     Other reaction(s): no benefit   Ivp Dye [Iodinated Contrast Media] Hives   Phenobarbital Hives   Propoxyphene    Sulfa Antibiotics Hives   Vicodin [Hydrocodone-Acetaminophen] Other (See Comments)    Very ill.     HOME MEDICATIONS: Outpatient Medications Prior to Visit  Medication Sig Dispense Refill   albuterol (VENTOLIN HFA) 108 (90 Base) MCG/ACT inhaler Inhale 2 puffs into the lungs every 6 (six) hours as needed for wheezing or shortness of breath. 8 g 3   amLODipine (NORVASC) 10 MG tablet Take 1 tablet (10 mg total) by mouth at bedtime. 90 tablet 2   Bromelain 250 MG CAPS Take by mouth daily.     budesonide-formoterol (SYMBICORT) 80-4.5 MCG/ACT inhaler Inhale 2 puffs into the lungs in the morning and at bedtime. 1 each 3   Coenzyme Q10 (CO Q-10) 200 MG CAPS Take 400 mg by mouth at bedtime.     Cranberry 500 MG CAPS Take by mouth. 2 times per day     Evolocumab (REPATHA SURECLICK) 140 MG/ML SOAJ Inject 140 mg into the skin every 14 (fourteen) days. 6 mL 3  ferrous sulfate 325 (65 FE) MG tablet daily.     fluticasone (FLONASE) 50 MCG/ACT nasal spray Administer 2 sprays in each nostril daily.     levothyroxine (SYNTHROID, LEVOTHROID) 75 MCG tablet Take 75 mcg by mouth daily before breakfast.     losartan (COZAAR) 100 MG tablet 1 tablet Orally Once a day for 90 days     MAGNESIUM PO Take 400 mg by mouth daily.     Multiple Vitamins-Minerals (ANTIOXIDANT PO) Take by mouth. 3 cap fulls daily     OVER THE COUNTER MEDICATION Stool softener 250 mg at bedtime     OVER THE COUNTER MEDICATION Glucosamine 2000mg  daily. Patient  stated "taking in powder form"     pantoprazole (PROTONIX) 40 MG tablet Take 40 mg by mouth daily as needed (indigestion).     Semaglutide-Weight Management (WEGOVY) 0.5 MG/0.5ML SOAJ Inject 0.5 mg into the skin every 7 (seven) days. 2 mL 0   Turmeric (CURCUMIN 95 PO) Take 1 tablet by mouth 2 (two) times daily.     VITAMIN B COMPLEX-C CAPS Take 1 capsule by mouth 2 (two) times daily.     vitamin C (ASCORBIC ACID) 500 MG tablet Take 500 mg by mouth 2 (two) times daily.     VITAMIN D PO Take by mouth daily. Per patient taking 5,000 units taking every evening/     zinc gluconate 50 MG tablet Take 50 mg by mouth daily.     No facility-administered medications prior to visit.     PAST MEDICAL HISTORY: Past Medical History:  Diagnosis Date   Arthritis    High cholesterol    Hypertension    Left hip pain 09/2019   Multiple joint pain 03/2020   HANDICAP PLACARD    Sleep apnea    Thyroid disease      PAST SURGICAL HISTORY: Past Surgical History:  Procedure Laterality Date   ABDOMINAL HYSTERECTOMY     BREAST BIOPSY     BUNIONECTOMY     CARPAL TUNNEL RELEASE Left    HAND SURGERY Right    MEDIAL PARTIAL KNEE REPLACEMENT     TONSILLECTOMY       FAMILY HISTORY: Family History  Problem Relation Age of Onset   Breast cancer Mother    Allergies Mother    Asthma Mother    Heart disease Father    Breast cancer Daughter    Cancer Daughter        breast     SOCIAL HISTORY: Social History   Socioeconomic History   Marital status: Married    Spouse name: Not on file   Number of children: 8   Years of education: Not on file   Highest education level: Not on file  Occupational History   Occupation: RETIRED HAIR DRESSER  Tobacco Use   Smoking status: Never   Smokeless tobacco: Never  Vaping Use   Vaping status: Never Used  Substance and Sexual Activity   Alcohol use: Yes    Comment: occ   Drug use: No   Sexual activity: Not on file  Other Topics Concern   Not on file   Social History Narrative   Retired Interior and spatial designer    Social Determinants of Corporate investment banker Strain: Not on file  Food Insecurity: Not on file  Transportation Needs: Not on file  Physical Activity: Not on file  Stress: Not on file  Social Connections: Not on file  Intimate Partner Violence: Unknown (12/16/2021)   [facility]   Intimate Partner Violence    Fear of Current or Ex-Partner: Not on file     PHYSICAL EXAM  There were no vitals filed for this visit. There is no height or weight on file to calculate BMI.  Generalized: Well developed, in no acute distress  Cardiology: normal rate and rhythm, no murmur auscultated  Respiratory: clear to auscultation bilaterally    Neurological examination  Mentation: Alert oriented to time, place, history taking. Follows all commands speech and language fluent Cranial nerve II-XII: Pupils were equal round reactive to light. Extraocular movements were full, visual field were full on confrontational test. Facial sensation and strength were normal. Uvula tongue midline. Head turning and shoulder shrug  were normal and symmetric. Motor: The motor testing reveals 5 over 5 strength of all 4 extremities. Good symmetric motor tone is noted throughout.  Sensory: Sensory testing is intact to soft touch on all 4 extremities. No evidence of extinction is noted.  Coordination: Cerebellar testing reveals good finger-nose-finger and heel-to-shin bilaterally.  Gait and station: Gait is normal. Tandem gait is normal. Romberg is negative. No drift is seen.  Reflexes: Deep tendon reflexes are symmetric and normal bilaterally.    DIAGNOSTIC DATA (LABS, IMAGING, TESTING) - I reviewed patient records, labs, notes, testing and imaging myself where available.  Lab Results  Component Value Date   WBC 11.6 (H) 07/06/2020   HGB 13.3 07/06/2020   HCT 42.4 07/06/2020   MCV 92.0 07/06/2020   PLT 248 07/06/2020       Component Value Date/Time   NA 138 11/26/2021 1354   K 4.9 11/26/2021 1354   CL 104 11/26/2021 1354   CO2 20 11/26/2021 1354   GLUCOSE 100 (H) 11/26/2021 1354   GLUCOSE 61 (L) 07/06/2020 1119   BUN 29 (H) 11/26/2021 1354   CREATININE 1.03 (H) 11/26/2021 1354   CALCIUM 9.8 11/26/2021 1354   PROT 6.9 10/07/2022 1151   ALBUMIN 4.7 10/07/2022 1151   AST 16 10/07/2022 1151   ALT 16 10/07/2022 1151   ALKPHOS 90 10/07/2022 1151   BILITOT 0.5 10/07/2022 1151   GFRNONAA >60 07/06/2020 1119   GFRAA >60 09/24/2018 2057   Lab Results  Component Value Date   CHOL 161 10/07/2022   HDL 60 10/07/2022   LDLCALC 71 10/07/2022   TRIG 184 (H) 10/07/2022   CHOLHDL 2.7 10/07/2022   No results found for: "HGBA1C" Lab Results  Component Value Date   VITAMINB12 885 07/09/2022   Lab Results  Component Value Date   TSH 3.45 12/12/2021        No data to display               No data to display           ASSESSMENT AND PLAN  72 y.o. year old female  has a past medical history of Arthritis, High cholesterol, Hypertension, Left hip pain (09/2019), Multiple joint pain (03/2020), Sleep apnea, and Thyroid disease. here with    No diagnosis found.  [name] ***.  Healthy lifestyle habits encouraged. *** will follow up with PCP as directed. *** will return to see me in ***, sooner if needed. *** verbalizes understanding and agreement with this plan.   No orders of the defined types were placed in this encounter.    No orders of the defined types were placed in this encounter.    Shawnie Dapper, MSN, FNP-C 03/19/2023, 1:10 PM  [facility] [address], [address] [place], [place] [address] [phone]

## 2023-03-19 NOTE — Patient Instructions (Signed)

## 2023-03-20 ENCOUNTER — Ambulatory Visit (INDEPENDENT_AMBULATORY_CARE_PROVIDER_SITE_OTHER): Payer: Medicare HMO

## 2023-03-20 ENCOUNTER — Ambulatory Visit: Payer: Medicare HMO | Admitting: Family Medicine

## 2023-03-20 ENCOUNTER — Encounter: Payer: Self-pay | Admitting: Family Medicine

## 2023-03-20 VITALS — BP 119/70 | HR 87 | Ht 63.0 in | Wt 207.0 lb

## 2023-03-20 DIAGNOSIS — R202 Paresthesia of skin: Secondary | ICD-10-CM

## 2023-03-20 DIAGNOSIS — R200 Anesthesia of skin: Secondary | ICD-10-CM

## 2023-03-20 DIAGNOSIS — R059 Cough, unspecified: Secondary | ICD-10-CM | POA: Diagnosis not present

## 2023-03-20 DIAGNOSIS — R053 Chronic cough: Secondary | ICD-10-CM | POA: Diagnosis not present

## 2023-03-20 DIAGNOSIS — R42 Dizziness and giddiness: Secondary | ICD-10-CM | POA: Diagnosis not present

## 2023-03-20 DIAGNOSIS — R519 Headache, unspecified: Secondary | ICD-10-CM

## 2023-03-20 DIAGNOSIS — R270 Ataxia, unspecified: Secondary | ICD-10-CM

## 2023-03-20 NOTE — Progress Notes (Addendum)
Reviewed and agree with assessment/plan.   Coralyn Helling, MD [facility] Pulmonary/Critical Care 04/01/2023, 5:10 PM Pager:  [phone]

## 2023-03-21 ENCOUNTER — Encounter (HOSPITAL_BASED_OUTPATIENT_CLINIC_OR_DEPARTMENT_OTHER): Payer: Self-pay | Admitting: Family

## 2023-03-21 ENCOUNTER — Ambulatory Visit (HOSPITAL_BASED_OUTPATIENT_CLINIC_OR_DEPARTMENT_OTHER): Payer: Medicare HMO | Admitting: Family

## 2023-03-21 VITALS — BP 106/66 | HR 88 | Ht 63.0 in | Wt 206.0 lb

## 2023-03-21 DIAGNOSIS — Z6836 Body mass index (BMI) 36.0-36.9, adult: Secondary | ICD-10-CM

## 2023-03-21 DIAGNOSIS — I2510 Atherosclerotic heart disease of native coronary artery without angina pectoris: Secondary | ICD-10-CM | POA: Diagnosis not present

## 2023-03-21 DIAGNOSIS — I10 Essential (primary) hypertension: Secondary | ICD-10-CM

## 2023-03-21 DIAGNOSIS — E785 Hyperlipidemia, unspecified: Secondary | ICD-10-CM | POA: Diagnosis not present

## 2023-03-21 NOTE — Patient Instructions (Addendum)
Medication Instructions:   STOP Repatha   *If you need a refill on your cardiac medications before your next appointment, please call your pharmacy*  Testing/Procedures: Your EKG today looks great!  Follow-Up: At [facility], you and your health needs are our priority.  As part of our continuing mission to provide you with exceptional heart care, we have created designated Provider Care Teams.  These Care Teams include your primary Cardiologist (physician) and Advanced Practice Providers (APPs -  Physician Assistants and Nurse Practitioners) who all work together to provide you with the care you need, when you need it.  We recommend signing up for the patient portal called "MyChart".  Sign up information is provided on this After Visit Summary.  MyChart is used to connect with patients for Virtual Visits (Telemedicine).  Patients are able to view lab/test results, encounter notes, upcoming appointments, etc.  Non-urgent messages can be sent to your provider as well.   To learn more about what you can do with MyChart, go to [email or web address].    Your next appointment:   In December as scheduled with Dr. Cristal Deer   Other Instructions  Keep an eye on your blood pressure at home, if consistently less than 120/80 at home let us know and we may reduce your blood pressure medications.   We will call you at the end of September to check in on how your leg cramps are doing and to re-discuss options for cholesterol management.

## 2023-03-21 NOTE — Progress Notes (Signed)
Cardiology Office Note:  .   Date:  03/21/2023  ID:  Elizabeth Roman, DOB January 18, 1951, MRN [id number] PCP: Georgann Housekeeper, MD  [facility] Providers Cardiologist:  Meriam Sprague, MD    History of Present Illness: .   Elizabeth Roman is a 72 y.o. female with hx of HTN, HLD, OSA, CAD, MR.  Cardiology visit 08/2020 with lightheadedness, monitor with 2 episodes SVT but no significant arrhythmias. Echo 12/19/20 normal LVEF, no RWMA, indeterminate diastolic parameters, mild MR. Coronary calcium score 11/01/21 of 674 placing her in 92nd percentile. Also noted atherosclerosis of thoracic aorta, calcification mitral valve annulus, small hiatal hernia. Seen 10/2021 in clinic with dyspnea, NM PET with no ischemia nor infarction, LVEF 56%, normal MBFR. Last seen by Dr. Shari Prows 06/03/22 with dizziness consistent with vertigo.   Presents today for follow up independently. Enjoys travelling to see family in [place], [place], [place]. Having difficulty with cost of Repatha. She also notes she is having leg cramps while taking. She took her last shot and does not plan to refill. She is hesitant about reducing her LDL to be "too low". Lipid panel 10/07/22 total cholesterol 161, triglycerides 184, LDL 71. LDL prior to that was 177. Reviewe CTA and LDL goals. Mild chest discomfort if she walks a long distance associated with dyspnea which improves with her PRN inhaler. She has been losing weight through diet changes and exercising in the pool. Also goes to a senior group exercise class. BP at home 117-120/60-70s  ROS: Please see the history of present illness.    All other systems reviewed and are negative.   Studies Reviewed: Marland Kitchen   EKG Interpretation Date/Time:  Friday March 21 2023 11:15:20 EDT Ventricular Rate:  88 PR Interval:  140 QRS Duration:  88 QT Interval:  356 QTC Calculation: 430 R Axis:   65  Text Interpretation: Normal sinus rhythm Normal ECG Confirmed by Gillian Shields ([id number]) on 03/21/2023  11:59:51 AM    Cardiac Studies & Procedures     STRESS TESTS  NM PET CT CARDIAC PERFUSION MULTI W/ABSOLUTE BLOODFLOW 12/18/2021  Narrative   LV perfusion is normal. There is no evidence of ischemia. There is no evidence of infarction.   Rest left ventricular function is normal. Rest EF: 56 %. Stress left ventricular function is normal. Stress EF: 67 %. End diastolic cavity size is normal.   Myocardial blood flow was computed to be 0.58ml/g/min at rest and 2.45ml/g/min at stress. Global myocardial blood flow reserve was 2.52 and was normal.   Coronary calcium was present on the attenuation correction CT images. Severe coronary calcifications were present. Coronary calcifications were present in the left anterior descending artery, left circumflex artery and right coronary artery distribution(s).   The study is normal. The study is low risk.   Electronically signed by Lennie Odor, MD  CLINICAL DATA:  This over-read does not include interpretation of cardiac or coronary anatomy or pathology. The Cardiac PET CT interpretation by the cardiologist is attached.  COMPARISON:  None Available.  FINDINGS: Vascular: Normal heart size. No pericardial effusion. Three-vessel coronary artery calcifications. Normal caliber thoracic aorta with mild calcific atherosclerotic disease.  Mediastinum/Nodes: Moderate hiatal hernia and patulous esophagus. No enlarged lymph nodes seen in the chest.  Lungs/Pleura: Central airways are patent. No consolidation, pleural effusion or pneumothorax.  Upper Abdomen: No acute abnormalities.  Musculoskeletal: No aggressive appearing osseous lesions.  IMPRESSION: 1. No acute extracardiac abnormalities. 2. Thoracic aortic Atherosclerosis (ICD10-I70.0).   Electronically Signed By: Tacey Ruiz  Strickland M.D. On: 12/18/2021 11:37   ECHOCARDIOGRAM  ECHOCARDIOGRAM COMPLETE 12/19/2020  Narrative ECHOCARDIOGRAM REPORT    Patient Name:   Elizabeth Roman   Date of  Exam: 12/19/2020 Medical Rec #:  [id number]     Height:       63.0 in Accession #:    [id number]    Weight:       213.6 lb Date of Birth:  [birth year]      BSA:          1.988 m Patient Age:    70 years      BP:           138/90 mmHg Patient Gender: F             HR:           72 bpm. Exam Location:  [address]  Procedure: 2D Echo, Cardiac Doppler and Color Doppler  Indications:    R42 Dizziness R00.2 Palpitations  History:        Patient has no prior history of Echocardiogram examinations. Risk Factors:Hypertension, Dyslipidemia and Obesity. Sleep apnea. Thyroid Disorder.  Sonographer:    Daphine Deutscher RDCS Referring Phys: [id number] HEATHER E [place]  IMPRESSIONS   1. Left ventricular ejection fraction, by estimation, is 65 to 70%. The left ventricle has normal function. The left ventricle has no regional wall motion abnormalities. Left ventricular diastolic parameters are indeterminate. 2. Right ventricular systolic function is normal. The right ventricular size is normal. 3. Mild mitral valve regurgitation. 4. The aortic valve is tricuspid. Aortic valve regurgitation is not visualized. Mild aortic valve sclerosis is present, with no evidence of aortic valve stenosis. 5. The inferior vena cava is normal in size with greater than 50% respiratory variability, suggesting right atrial pressure of 3 mmHg.  FINDINGS Left Ventricle: Left ventricular ejection fraction, by estimation, is 65 to 70%. The left ventricle has normal function. The left ventricle has no regional wall motion abnormalities. The left ventricular internal cavity size was normal in size. There is no left ventricular hypertrophy. Left ventricular diastolic parameters are indeterminate.  Right Ventricle: The right ventricular size is normal. No increase in right ventricular wall thickness. Right ventricular systolic function is normal.  Left Atrium: Left atrial size was normal in size.  Right Atrium: Right  atrial size was normal in size.  Pericardium: There is no evidence of pericardial effusion.  Mitral Valve: There is mild thickening of the mitral valve leaflet(s). Mild mitral valve regurgitation.  Tricuspid Valve: The tricuspid valve is normal in structure. Tricuspid valve regurgitation is trivial.  Aortic Valve: The aortic valve is tricuspid. Aortic valve regurgitation is not visualized. Mild aortic valve sclerosis is present, with no evidence of aortic valve stenosis.  Pulmonic Valve: The pulmonic valve was not well visualized. Pulmonic valve regurgitation is not visualized.  Aorta: The aortic root and ascending aorta are structurally normal, with no evidence of dilitation.  Venous: The inferior vena cava is normal in size with greater than 50% respiratory variability, suggesting right atrial pressure of 3 mmHg.  IAS/Shunts: No atrial level shunt detected by color flow Doppler.   LEFT VENTRICLE PLAX 2D LVIDd:         4.80 cm  Diastology LVIDs:         3.00 cm  LV e' medial:    7.29 cm/s LV PW:         0.90 cm  LV E/e' medial:  10.1 LV IVS:  0.80 cm  LV e' lateral:   9.79 cm/s LVOT diam:     1.90 cm  LV E/e' lateral: 7.5 LV SV:         69 LV SV Index:   35 LVOT Area:     2.84 cm   RIGHT VENTRICLE             IVC RV Basal diam:  4.10 cm     IVC diam: 1.50 cm RV S prime:     14.80 cm/s TAPSE (M-mode): 2.8 cm  LEFT ATRIUM             Index       RIGHT ATRIUM           Index LA diam:        4.60 cm 2.31 cm/m  RA Area:     14.90 cm LA Vol (A2C):   57.3 ml 28.82 ml/m RA Volume:   39.80 ml  20.02 ml/m LA Vol (A4C):   60.2 ml 30.27 ml/m LA Biplane Vol: 63.5 ml 31.93 ml/m AORTIC VALVE LVOT Vmax:   94.45 cm/s LVOT Vmean:  67.750 cm/s LVOT VTI:    0.243 m  AORTA Ao Root diam: 3.20 cm Ao Asc diam:  3.40 cm  MITRAL VALVE                TRICUSPID VALVE MV Area (PHT): 2.76 cm     TR Peak grad:   23.4 mmHg MV Decel Time: 275 msec     TR Vmax:        242.00 cm/s MV  E velocity: 73.30 cm/s MV A velocity: 113.00 cm/s  SHUNTS MV E/A ratio:  0.65         Systemic VTI:  0.24 m Systemic Diam: 1.90 cm  Dietrich Pates MD Electronically signed by Dietrich Pates MD Signature Date/Time: 12/19/2020/12:04:07 PM    Final    MONITORS  LONG TERM MONITOR (3-14 DAYS) 10/03/2020  Narrative  Patch wear time was 6 days and 22 hours  Predominant rhythm was NSR with average HR 85bpm (ranging from 65-169bpm)  2 episodes of non-sustained SVT with longest lasting 6 beats at rate of 156bpm and fastest lasting 4 beats at rate 169bpm (not associated with patient triggered event)  Rare SVEs, rare PVCs <1%  No AFib, pauses or sustained arrhythmias   Patch Wear Time:  6 days and 22 hours (2022-02-22T12:09:09-498 to 2022-03-01T11:05:11-498)  Patient had a min HR of 65 bpm, max HR of 169 bpm, and avg HR of 85 bpm. Predominant underlying rhythm was Sinus Rhythm. 2 Supraventricular Tachycardia runs occurred, the run with the fastest interval lasting 4 beats with a max rate of 169 bpm, the longest lasting 6 beats with an avg rate of 156 bpm. Isolated SVEs were rare (<1.0%), SVE Couplets were rare (<1.0%), and SVE Triplets were rare (<1.0%). Isolated VEs were rare (<1.0%), and no VE Couplets or VE Triplets were present.  Laurance Flatten, MD           Risk Assessment/Calculations:             Physical Exam:   VS:  BP 106/66 (BP Location: Left Arm, Patient Position: Sitting, Cuff Size: Large)   Pulse 88   Ht 5\' 3"  (1.6 m)   Wt 206 lb (93.4 kg)   BMI 36.49 kg/m    Wt Readings from Last 3 Encounters:  03/21/23 206 lb (93.4 kg)  03/20/23 207 lb (93.9 kg)  03/14/23 213 lb (96.6  kg)    GEN: Well nourished, overweight, well developed in no acute distress NECK: No JVD; No carotid bruits CARDIAC: RRR, no murmurs, rubs, gallops RESPIRATORY:  Clear to auscultation without rales, wheezing or rhonchi  ABDOMEN: Soft, non-tender, non-distended EXTREMITIES:  No edema; No deformity    ASSESSMENT AND PLAN: .    CAD - Stable with no anginal symptoms. No indication for ischemic evaluation.  Lipid management, as below. Recommend aiming for 150 minutes of moderate intensity activity per week and following a heart healthy diet.    HLD, LDL goal <70 - Prior intolerance to statin and Zetia. She took her last dose of Repatha today. She notes leg cramps, discussed atypical side effect of Repatha. However, per her request will hold next Repatha dose and check in on symptoms at end of September. Will reach out to pharmacy team about cost concerns.   HTN - Relatively hypotensive. Discussed to monitor BP at home at least 2 hours after medications and sitting for 5-10 minutes. If BP persistently <120/80 at home or if has lightheadedness/dizziness, she will contact us and plan to reduce her Amlodipine in half.   BMI 36 - Weight loss via diet and exercise encouraged. Discussed the impact being overweight would have on cardiovascular risk. Congratulated on weight loss.        Dispo: follow up in 4 months (to establish with Dr. Cristal Deer)  Signed, Alver Sorrow, NP

## 2023-03-24 ENCOUNTER — Ambulatory Visit: Payer: Self-pay | Admitting: Neurology

## 2023-03-24 ENCOUNTER — Ambulatory Visit (INDEPENDENT_AMBULATORY_CARE_PROVIDER_SITE_OTHER): Payer: Medicare HMO | Admitting: Neurology

## 2023-03-24 DIAGNOSIS — R202 Paresthesia of skin: Secondary | ICD-10-CM | POA: Diagnosis not present

## 2023-03-24 DIAGNOSIS — R200 Anesthesia of skin: Secondary | ICD-10-CM

## 2023-03-24 DIAGNOSIS — Z0289 Encounter for other administrative examinations: Secondary | ICD-10-CM

## 2023-03-26 DIAGNOSIS — Z6836 Body mass index (BMI) 36.0-36.9, adult: Secondary | ICD-10-CM | POA: Diagnosis not present

## 2023-03-26 DIAGNOSIS — E669 Obesity, unspecified: Secondary | ICD-10-CM | POA: Diagnosis not present

## 2023-03-26 DIAGNOSIS — I10 Essential (primary) hypertension: Secondary | ICD-10-CM | POA: Diagnosis not present

## 2023-03-26 DIAGNOSIS — R7303 Prediabetes: Secondary | ICD-10-CM | POA: Diagnosis not present

## 2023-03-26 DIAGNOSIS — I700 Atherosclerosis of aorta: Secondary | ICD-10-CM | POA: Diagnosis not present

## 2023-03-26 NOTE — Progress Notes (Unsigned)
Full Name: Elizabeth Roman Gender: Female MRN #: [id number] Date of Birth: Apr 04, 1951    Visit Date: 03/24/2023 13:33 Age: 72 Years Examining Physician: Dr. Naomie Dean Referring Physician: Shawnie Dapper, NP Height: 5 feet 3 inch   History: Patient with pain in her hands and left-sided pain in the back with radicular low-back symptoms    Summary:    The right median APB motor nerve showed reduced amplitude(2.1 mV, N<4) The left  median APB motor nerve showed  reduced amplitude(2.0 mV, N<4)  The right median/ulnar (palm) comparison nerve showed prolonged distal peak latency (Median Palm, 2.7 ms, N<2.2) and abnormal peak latency difference (Median Palm-Ulnar Palm, 0.8 ms, N<0.4) with a relative median delay.    The left median/ulnar (palm) comparison nerve showed prolonged distal peak latency (Median Palm, 2.5 ms, N<2.2) and abnormal peak latency difference (Median Palm-Ulnar Palm, 0.5 ms, N<0.4) with a relative median delay.    The right Median 2nd Digit orthodromic sensory nerve showed prolonged distal peak latency (4.2 ms, N<3.4)  The left  Median 2nd Digit orthodromic sensory nerve showed prolonged distal peak latency (3.74ms, N<3.4) and reduced amplitude(8 uV, N>10) All remaining nerves (as indicated in the following tables) were within normal limits.   EMG/Needle study showed: The left and right opponens pollicis muscle showed spontaneous activity prolonged motor unit duration, polyphasic motor units and diminished motor unit recruitment..  The left lower lumbar paraspinals showed spontaneous activity.  The left gastrocnemius muscle and the left extensor hallucis longus muscle showed spontaneous activity, prolonged motor unit duration, polyphasic motor units, and diminished motor unit recruitment.All remaining muscles (as indicated in the following tables) were within normal limits.     Conclusion:  1. There is severe Carpal Tunnel Syndrome with acte/ongoing denervation and chronic  neurogenic changes in distal medial muscles. No evidence for large-fiber polyneuropathy or cervical radiculopathy. 2. The left leg study is consistent with lumbar radiculopathy but no large-fiber polyneuropathy. There is acute/ongoing denervation and chronic neurogenic changes in left lower lumbar paraspinals and in left muscles that share L5,S1 innervation as well as decreased amplitude of Tibial motor conduction with normal sensory conductions which are c/w radiculopathy. She could not tolerate EMG needle study of the right leg.   ------------------------------- Naomie Dean , M.D.  [facility] [address] [place], [place] [address] Tel: [phone] Fax: [phone]  Verbal informed consent was obtained from the patient, patient was informed of potential risk of procedure, including bruising, bleeding, hematoma formation, infection, muscle weakness, muscle pain, numbness, among others.        MNC    Nerve / Sites Muscle Latency Ref. Amplitude Ref. Rel Amp Segments Distance Velocity Ref. Area    ms ms mV mV %  cm m/s m/s mVms  R Median - APB     Wrist APB 4.1 ?4.4 2.1 ?4.0 100 Wrist - APB 7   5.4     Upper arm APB 9.2  1.8  82.1 Upper arm - Wrist 26 51 ?49 4.4  L Median - APB     Wrist APB 3.6 ?4.4 2.0 ?4.0 100 Wrist - APB 7   5.2     Upper arm APB 8.0  3.5  177 Upper arm - Wrist 24 55 ?49 8.8  R Ulnar - ADM     Wrist ADM 2.6 ?3.3 7.2 ?6.0 100 Wrist - ADM 7   22.9     B.Elbow ADM 4.7  6.4  88.1 B.Elbow -  Wrist 14 68 ?49 21.2     A.Elbow ADM 7.2  7.0  110 A.Elbow - B.Elbow 15 61 ?49 23.6  R Peroneal - EDB     Ankle EDB 4.0 ?6.5 3.3 ?2.0 100 Ankle - EDB 9   11.1     Fib head EDB 10.0  3.3  102 Fib head - Ankle 29 48 ?44 11.0     Pop fossa EDB 12.0  3.1  93.1 Pop fossa - Fib head 12 60 ?44 10.1         Pop fossa - Ankle      L Peroneal - EDB     Ankle EDB 5.5 ?6.5 2.2 ?2.0 100 Ankle - EDB 9   6.1     Fib head EDB 11.7  1.8  84.2 Fib head - Ankle 28 45  ?44 5.7     Pop fossa EDB 13.8  1.4  77.4 Pop fossa - Fib head 10 47 ?44 3.8         Pop fossa - Ankle      R Tibial - AH     Ankle AH 5.6 ?5.8 5.4 ?4.0 100 Ankle - AH 9   12.0     Pop fossa AH 12.3  4.2  79 Pop fossa - Ankle 38 57 ?41 11.6  L Tibial - AH     Ankle AH 5.4 ?5.8 3.4 ?4.0 100 Ankle - AH 9   8.3     Pop fossa AH 14.3  1.9  56.1 Pop fossa - Ankle 37 41 ?41 4.9                   SNC    Nerve / Sites Rec. Site Peak Lat Ref.  Amp Ref. Segments Distance Peak Diff Ref.    ms ms V V  cm ms ms  R Radial - Anatomical snuff box (Forearm)     Forearm Wrist 2.2 ?2.9 15 ?15 Forearm - Wrist 10    R Sural - Ankle (Calf)     Calf Ankle 4.0 ?4.4 6 ?6 Calf - Ankle 14    L Sural - Ankle (Calf)     Calf Ankle 2.9 ?4.4 5 ?6 Calf - Ankle 14    R Superficial peroneal - Ankle     Lat leg Ankle 3.4 ?4.4 8 ?6 Lat leg - Ankle 14    L Superficial peroneal - Ankle     Lat leg Ankle 3.8 ?4.4 8 ?6 Lat leg - Ankle 14    R Median, Ulnar - Transcarpal comparison     Median Palm Wrist 2.7 ?2.2 54 ?35 Median Palm - Wrist 8       Ulnar Palm Wrist 1.9 ?2.2 34 ?12 Ulnar Palm - Wrist 8          Median Palm - Ulnar Palm  0.8 ?0.4  L Median, Ulnar - Transcarpal comparison     Median Palm Wrist 2.5 ?2.2 44 ?35 Median Palm - Wrist 8       Ulnar Palm Wrist 2.0 ?2.2 20 ?12 Ulnar Palm - Wrist 8          Median Palm - Ulnar Palm  0.5 ?0.4  R Median - Orthodromic (Dig II, Mid palm)     Dig II Wrist 4.2 ?3.4 20 ?10 Dig II - Wrist 13    L Median - Orthodromic (Dig II, Mid palm)     Dig II Wrist 3.6 ?3.4 8 ?10 Dig II -  Wrist 13    R Ulnar - Orthodromic, (Dig V, Mid palm)     Dig V Wrist 2.8 ?3.1 12 ?5 Dig V - Wrist 75                           F  Wave    Nerve F Lat Ref.   ms ms  R Tibial - AH 44.4 ?56.0  R Ulnar - ADM 28.8 ?32.0  L Tibial - AH 54.8 ?56.0           H Reflex    Nerve H Lat Lat Hmax   ms ms   Left Right Ref. Left Right Ref.  Tibial - Soleus 33.3 32.6 ?35.0 33.1 19.8 ?35.0         EMG  Summary Table    Spontaneous MUAP Recruitment  Muscle IA Fib PSW Fasc Other Amp Dur. Poly Pattern  L. Cervical paraspinals (low) Normal None None None _______ Normal Normal Normal Normal  L. Deltoid Normal None None None _______ Normal Normal Normal Normal  L. Triceps brachii Normal None None None _______ Normal Normal Normal Normal  L. Pronator teres Normal None None None _______ Normal Normal Normal Normal  L. Opponens pollicis Normal None 3+ None _______ Increased Normal 2+ Reduced  L. First dorsal interosseous Normal None None None _______ Normal Normal Normal Normal  L. Extensor indicis proprius Normal None None None _______ Normal Normal Normal Normal  R. First dorsal interosseous Normal None None None _______ Normal Normal Normal Normal  R. Extensor indicis proprius Normal None None None _______ Normal Normal Normal Normal  R. Opponens pollicis Normal None 3+ None _______ Increased Normal 2+ Reduced  L. Lumbar paraspinals (low) Normal None +2 None _______ Normal Normal Normal Normal  L. Vastus medialis Normal None None None _______ Normal Normal Normal Normal  L. Tibialis anterior Normal None None None _______ Normal Normal Normal Normal  L. Gastrocnemius (Medial head) Normal None 3+ None _______ Normal Increased 3+ Reduced  L. Extensor hallucis longus Normal None 3+ None CRDs Normal Increased 3+ Reduced  L. Biceps femoris (long head) Normal None None None _______ Normal Normal Normal Normal  L. Gluteus maximus Normal None None None _______ Normal Normal Normal Normal  L. Gluteus medius Normal None None None _______ Normal Normal Normal Normal

## 2023-03-28 ENCOUNTER — Encounter (HOSPITAL_BASED_OUTPATIENT_CLINIC_OR_DEPARTMENT_OTHER): Payer: Self-pay

## 2023-03-28 NOTE — Progress Notes (Signed)
Please let patient know there was no acute cardiopulmonary disease.  She had some atelectasis to right lung base, remainder of lungs were clear.  No evidence of pneumonia.  She did have a compression deformity of her upper thoracic vertebrae, this could be source of her back pain.  Recommend she follow-up with primary care or sports medicine

## 2023-03-31 NOTE — Procedures (Signed)
Full Name: Elizabeth Roman Gender: Female MRN #: [id number] Date of Birth: Apr 04, 1951    Visit Date: 03/24/2023 13:33 Age: 72 Years Examining Physician: Dr. Naomie Dean Referring Physician: Shawnie Dapper, NP Height: 5 feet 3 inch   History: Patient with pain in her hands and left-sided pain in the back with radicular low-back symptoms    Summary:    The right median APB motor nerve showed reduced amplitude(2.1 mV, N<4) The left  median APB motor nerve showed  reduced amplitude(2.0 mV, N<4)  The right median/ulnar (palm) comparison nerve showed prolonged distal peak latency (Median Palm, 2.7 ms, N<2.2) and abnormal peak latency difference (Median Palm-Ulnar Palm, 0.8 ms, N<0.4) with a relative median delay.    The left median/ulnar (palm) comparison nerve showed prolonged distal peak latency (Median Palm, 2.5 ms, N<2.2) and abnormal peak latency difference (Median Palm-Ulnar Palm, 0.5 ms, N<0.4) with a relative median delay.    The right Median 2nd Digit orthodromic sensory nerve showed prolonged distal peak latency (4.2 ms, N<3.4)  The left  Median 2nd Digit orthodromic sensory nerve showed prolonged distal peak latency (3.74ms, N<3.4) and reduced amplitude(8 uV, N>10) All remaining nerves (as indicated in the following tables) were within normal limits.   EMG/Needle study showed: The left and right opponens pollicis muscle showed spontaneous activity prolonged motor unit duration, polyphasic motor units and diminished motor unit recruitment..  The left lower lumbar paraspinals showed spontaneous activity.  The left gastrocnemius muscle and the left extensor hallucis longus muscle showed spontaneous activity, prolonged motor unit duration, polyphasic motor units, and diminished motor unit recruitment.All remaining muscles (as indicated in the following tables) were within normal limits.     Conclusion:  1. There is severe Carpal Tunnel Syndrome with acte/ongoing denervation and chronic  neurogenic changes in distal medial muscles. No evidence for large-fiber polyneuropathy or cervical radiculopathy. 2. The left leg study is consistent with lumbar radiculopathy but no large-fiber polyneuropathy. There is acute/ongoing denervation and chronic neurogenic changes in left lower lumbar paraspinals and in left muscles that share L5,S1 innervation as well as decreased amplitude of Tibial motor conduction with normal sensory conductions which are c/w radiculopathy. She could not tolerate EMG needle study of the right leg.   ------------------------------- Naomie Dean , M.D.  [facility] [address] [place], [place] [address] Tel: [phone] Fax: [phone]  Verbal informed consent was obtained from the patient, patient was informed of potential risk of procedure, including bruising, bleeding, hematoma formation, infection, muscle weakness, muscle pain, numbness, among others.        MNC    Nerve / Sites Muscle Latency Ref. Amplitude Ref. Rel Amp Segments Distance Velocity Ref. Area    ms ms mV mV %  cm m/s m/s mVms  R Median - APB     Wrist APB 4.1 ?4.4 2.1 ?4.0 100 Wrist - APB 7   5.4     Upper arm APB 9.2  1.8  82.1 Upper arm - Wrist 26 51 ?49 4.4  L Median - APB     Wrist APB 3.6 ?4.4 2.0 ?4.0 100 Wrist - APB 7   5.2     Upper arm APB 8.0  3.5  177 Upper arm - Wrist 24 55 ?49 8.8  R Ulnar - ADM     Wrist ADM 2.6 ?3.3 7.2 ?6.0 100 Wrist - ADM 7   22.9     B.Elbow ADM 4.7  6.4  88.1 B.Elbow -  Wrist 14 68 ?49 21.2     A.Elbow ADM 7.2  7.0  110 A.Elbow - B.Elbow 15 61 ?49 23.6  R Peroneal - EDB     Ankle EDB 4.0 ?6.5 3.3 ?2.0 100 Ankle - EDB 9   11.1     Fib head EDB 10.0  3.3  102 Fib head - Ankle 29 48 ?44 11.0     Pop fossa EDB 12.0  3.1  93.1 Pop fossa - Fib head 12 60 ?44 10.1         Pop fossa - Ankle      L Peroneal - EDB     Ankle EDB 5.5 ?6.5 2.2 ?2.0 100 Ankle - EDB 9   6.1     Fib head EDB 11.7  1.8  84.2 Fib head - Ankle 28 45  ?44 5.7     Pop fossa EDB 13.8  1.4  77.4 Pop fossa - Fib head 10 47 ?44 3.8         Pop fossa - Ankle      R Tibial - AH     Ankle AH 5.6 ?5.8 5.4 ?4.0 100 Ankle - AH 9   12.0     Pop fossa AH 12.3  4.2  79 Pop fossa - Ankle 38 57 ?41 11.6  L Tibial - AH     Ankle AH 5.4 ?5.8 3.4 ?4.0 100 Ankle - AH 9   8.3     Pop fossa AH 14.3  1.9  56.1 Pop fossa - Ankle 37 41 ?41 4.9                   SNC    Nerve / Sites Rec. Site Peak Lat Ref.  Amp Ref. Segments Distance Peak Diff Ref.    ms ms V V  cm ms ms  R Radial - Anatomical snuff box (Forearm)     Forearm Wrist 2.2 ?2.9 15 ?15 Forearm - Wrist 10    R Sural - Ankle (Calf)     Calf Ankle 4.0 ?4.4 6 ?6 Calf - Ankle 14    L Sural - Ankle (Calf)     Calf Ankle 2.9 ?4.4 5 ?6 Calf - Ankle 14    R Superficial peroneal - Ankle     Lat leg Ankle 3.4 ?4.4 8 ?6 Lat leg - Ankle 14    L Superficial peroneal - Ankle     Lat leg Ankle 3.8 ?4.4 8 ?6 Lat leg - Ankle 14    R Median, Ulnar - Transcarpal comparison     Median Palm Wrist 2.7 ?2.2 54 ?35 Median Palm - Wrist 8       Ulnar Palm Wrist 1.9 ?2.2 34 ?12 Ulnar Palm - Wrist 8          Median Palm - Ulnar Palm  0.8 ?0.4  L Median, Ulnar - Transcarpal comparison     Median Palm Wrist 2.5 ?2.2 44 ?35 Median Palm - Wrist 8       Ulnar Palm Wrist 2.0 ?2.2 20 ?12 Ulnar Palm - Wrist 8          Median Palm - Ulnar Palm  0.5 ?0.4  R Median - Orthodromic (Dig II, Mid palm)     Dig II Wrist 4.2 ?3.4 20 ?10 Dig II - Wrist 13    L Median - Orthodromic (Dig II, Mid palm)     Dig II Wrist 3.6 ?3.4 8 ?10 Dig II -  Wrist 13    R Ulnar - Orthodromic, (Dig V, Mid palm)     Dig V Wrist 2.8 ?3.1 12 ?5 Dig V - Wrist 75                           F  Wave    Nerve F Lat Ref.   ms ms  R Tibial - AH 44.4 ?56.0  R Ulnar - ADM 28.8 ?32.0  L Tibial - AH 54.8 ?56.0           H Reflex    Nerve H Lat Lat Hmax   ms ms   Left Right Ref. Left Right Ref.  Tibial - Soleus 33.3 32.6 ?35.0 33.1 19.8 ?35.0         EMG  Summary Table    Spontaneous MUAP Recruitment  Muscle IA Fib PSW Fasc Other Amp Dur. Poly Pattern  L. Cervical paraspinals (low) Normal None None None _______ Normal Normal Normal Normal  L. Deltoid Normal None None None _______ Normal Normal Normal Normal  L. Triceps brachii Normal None None None _______ Normal Normal Normal Normal  L. Pronator teres Normal None None None _______ Normal Normal Normal Normal  L. Opponens pollicis Normal None 3+ None _______ Increased Normal 2+ Reduced  L. First dorsal interosseous Normal None None None _______ Normal Normal Normal Normal  L. Extensor indicis proprius Normal None None None _______ Normal Normal Normal Normal  R. First dorsal interosseous Normal None None None _______ Normal Normal Normal Normal  R. Extensor indicis proprius Normal None None None _______ Normal Normal Normal Normal  R. Opponens pollicis Normal None 3+ None _______ Increased Normal 2+ Reduced  L. Lumbar paraspinals (low) Normal None +2 None _______ Normal Normal Normal Normal  L. Vastus medialis Normal None None None _______ Normal Normal Normal Normal  L. Tibialis anterior Normal None None None _______ Normal Normal Normal Normal  L. Gastrocnemius (Medial head) Normal None 3+ None _______ Normal Increased 3+ Reduced  L. Extensor hallucis longus Normal None 3+ None CRDs Normal Increased 3+ Reduced  L. Biceps femoris (long head) Normal None None None _______ Normal Normal Normal Normal  L. Gluteus maximus Normal None None None _______ Normal Normal Normal Normal  L. Gluteus medius Normal None None None _______ Normal Normal Normal Normal

## 2023-04-01 ENCOUNTER — Telehealth: Payer: Self-pay

## 2023-04-01 NOTE — Telephone Encounter (Signed)
Called patient to review cxr results.  Patient c/o cough with a lot of mucus production and chest pain persistent with cough.  Patient using Breztri, one puff twice a day due to cost.  Patient has not started Symbicort.  Also using albuterol prn.  Patient asking if results of cxr, atelectasis to right lung base, is causing the cough with mucus production. Patient states no fever, dyspnea or SOB.  Reviewed cxr and recommendations.  Patient still has questions about results and why she has persistent cough with a lot of mucus .  Patient requesting provider to call her.    Please call patient to discuss cxr in detail.

## 2023-04-01 NOTE — Telephone Encounter (Signed)
Reviewed results. No acute process.  She has a lot of sinus drainage. She going to use nasal spray more consistently and will consider taking OTC antihistamine. She declined need for cough medication. Back pain is her main issues, likely from compression deformity- she plans on speaking with her primary care or neurologist.

## 2023-04-03 MED ORDER — REPATHA SURECLICK 140 MG/ML ~~LOC~~ SOAJ: 140.0000 mg | SUBCUTANEOUS | 3 refills | Status: DC

## 2023-04-03 NOTE — Telephone Encounter (Signed)
Spoke with pt and advised her that grant funding has reopened for Repatha. I have enrolled her in the [employer] and sent info to her via mychart. She was appreciative for the assistance. She did mention having some cramping and was wondering if Repatha could be contributing. She would be due for an injection tomorrow. I advised her she could skip her injection tomorrow and monitor for any improvement in cramping, then resume her Repatha and see if cramping returns. It's not a common side effect of Repatha but is possible and this would be the best way to determine if her symptoms are coming from the med.

## 2023-04-10 ENCOUNTER — Encounter: Payer: Self-pay | Admitting: Family Medicine

## 2023-04-21 NOTE — Progress Notes (Unsigned)
   CC:  imbalance, paresthesias, headaches  Follow-up Visit  Last visit: 03/20/23  Brief HPI: 72 year old female with a history of prediabetes, HTN, CAD, OSA who follows in clinic for dizziness, imbalance, and paresthesias. MRI brain 07/24/22 shows a small right frontal DVA and mild chronic microvascular ischemic changes. EMG 03/24/23 with severe carpal tunnel syndrome and left L5, S1 radiculopathy.  Interval History: Imbalance***dizziness***headaches***   Physical Exam:   Vital Signs: There were no vitals taken for this visit. GENERAL:  well appearing, in no acute distress, alert  SKIN:  Color, texture, turgor normal. No rashes or lesions HEAD:  Normocephalic/atraumatic. RESP: normal respiratory effort MSK:  No gross joint deformities.   NEUROLOGICAL: Mental Status: Alert, oriented to person, place and time, Follows commands, and Speech fluent and appropriate. Cranial Nerves: PERRL, face symmetric, no dysarthria, hearing grossly intact Motor: moves all extremities equally Gait: normal-based.  IMPRESSION: ***  PLAN: ***   Follow-up: ***  I spent a total of *** minutes on the date of the service. Discussed medication side effects, adverse reactions and drug interactions. Written educational materials and patient instructions outlining all of the above were given.  Ocie Doyne, MD

## 2023-04-22 ENCOUNTER — Encounter: Payer: Self-pay | Admitting: Psychiatry

## 2023-04-22 ENCOUNTER — Ambulatory Visit: Payer: Medicare HMO | Admitting: Psychiatry

## 2023-04-22 VITALS — BP 120/80 | HR 76 | Ht 63.0 in | Wt 209.0 lb

## 2023-04-22 DIAGNOSIS — G5603 Carpal tunnel syndrome, bilateral upper limbs: Secondary | ICD-10-CM

## 2023-04-22 DIAGNOSIS — M48061 Spinal stenosis, lumbar region without neurogenic claudication: Secondary | ICD-10-CM

## 2023-04-23 DIAGNOSIS — I10 Essential (primary) hypertension: Secondary | ICD-10-CM | POA: Diagnosis not present

## 2023-04-23 DIAGNOSIS — R7303 Prediabetes: Secondary | ICD-10-CM | POA: Diagnosis not present

## 2023-04-23 DIAGNOSIS — E669 Obesity, unspecified: Secondary | ICD-10-CM | POA: Diagnosis not present

## 2023-04-23 DIAGNOSIS — I700 Atherosclerosis of aorta: Secondary | ICD-10-CM | POA: Diagnosis not present

## 2023-04-23 DIAGNOSIS — Z6836 Body mass index (BMI) 36.0-36.9, adult: Secondary | ICD-10-CM | POA: Diagnosis not present

## 2023-04-24 ENCOUNTER — Telehealth: Payer: Self-pay | Admitting: Psychiatry

## 2023-04-24 NOTE — Telephone Encounter (Signed)
Referral for orthopedics fax to [facility]. Phone: [phone], Fax: [phone].

## 2023-04-25 ENCOUNTER — Other Ambulatory Visit: Payer: Self-pay | Admitting: Internal Medicine

## 2023-04-25 DIAGNOSIS — Z1231 Encounter for screening mammogram for malignant neoplasm of breast: Secondary | ICD-10-CM

## 2023-04-28 ENCOUNTER — Encounter (HOSPITAL_BASED_OUTPATIENT_CLINIC_OR_DEPARTMENT_OTHER): Payer: Self-pay

## 2023-04-28 NOTE — Telephone Encounter (Signed)
Muscle cramp update

## 2023-04-29 ENCOUNTER — Other Ambulatory Visit (HOSPITAL_COMMUNITY): Payer: Self-pay

## 2023-04-30 ENCOUNTER — Other Ambulatory Visit (HOSPITAL_COMMUNITY): Payer: Self-pay

## 2023-04-30 MED ORDER — SEMAGLUTIDE-WEIGHT MANAGEMENT 0.5 MG/0.5ML ~~LOC~~ SOAJ
0.5000 mg | SUBCUTANEOUS | 0 refills | Status: DC
  Filled 2023-04-30: qty 2, 28d supply, fill #0

## 2023-05-01 DIAGNOSIS — G5601 Carpal tunnel syndrome, right upper limb: Secondary | ICD-10-CM | POA: Insufficient documentation

## 2023-05-01 DIAGNOSIS — M1811 Unilateral primary osteoarthritis of first carpometacarpal joint, right hand: Secondary | ICD-10-CM | POA: Insufficient documentation

## 2023-05-12 DIAGNOSIS — M67961 Unspecified disorder of synovium and tendon, right lower leg: Secondary | ICD-10-CM | POA: Diagnosis not present

## 2023-05-12 DIAGNOSIS — L84 Corns and callosities: Secondary | ICD-10-CM | POA: Diagnosis not present

## 2023-05-12 DIAGNOSIS — M67962 Unspecified disorder of synovium and tendon, left lower leg: Secondary | ICD-10-CM | POA: Diagnosis not present

## 2023-05-20 DIAGNOSIS — M5416 Radiculopathy, lumbar region: Secondary | ICD-10-CM | POA: Insufficient documentation

## 2023-05-20 DIAGNOSIS — M4316 Spondylolisthesis, lumbar region: Secondary | ICD-10-CM | POA: Diagnosis not present

## 2023-05-20 DIAGNOSIS — M47816 Spondylosis without myelopathy or radiculopathy, lumbar region: Secondary | ICD-10-CM | POA: Insufficient documentation

## 2023-05-20 DIAGNOSIS — M5451 Vertebrogenic low back pain: Secondary | ICD-10-CM | POA: Diagnosis not present

## 2023-05-20 DIAGNOSIS — M47896 Other spondylosis, lumbar region: Secondary | ICD-10-CM | POA: Diagnosis not present

## 2023-05-28 ENCOUNTER — Other Ambulatory Visit (HOSPITAL_COMMUNITY): Payer: Self-pay

## 2023-05-28 DIAGNOSIS — I700 Atherosclerosis of aorta: Secondary | ICD-10-CM | POA: Diagnosis not present

## 2023-05-28 DIAGNOSIS — I10 Essential (primary) hypertension: Secondary | ICD-10-CM | POA: Diagnosis not present

## 2023-05-28 MED ORDER — WEGOVY 1 MG/0.5ML ~~LOC~~ SOAJ
1.0000 mg | SUBCUTANEOUS | 0 refills | Status: DC
  Filled 2023-05-28: qty 2, 28d supply, fill #0

## 2023-05-29 ENCOUNTER — Other Ambulatory Visit (HOSPITAL_COMMUNITY): Payer: Self-pay

## 2023-05-30 DIAGNOSIS — M1811 Unilateral primary osteoarthritis of first carpometacarpal joint, right hand: Secondary | ICD-10-CM | POA: Diagnosis not present

## 2023-06-02 ENCOUNTER — Ambulatory Visit: Payer: Medicare HMO

## 2023-06-03 ENCOUNTER — Ambulatory Visit
Admission: RE | Admit: 2023-06-03 | Discharge: 2023-06-03 | Disposition: A | Discharge status: Home / Self Care | Payer: Medicare HMO | Source: Ambulatory Visit | Attending: Internal Medicine | Admitting: Internal Medicine

## 2023-06-03 ENCOUNTER — Other Ambulatory Visit (HOSPITAL_COMMUNITY): Payer: Self-pay

## 2023-06-03 DIAGNOSIS — Z1231 Encounter for screening mammogram for malignant neoplasm of breast: Secondary | ICD-10-CM | POA: Diagnosis not present

## 2023-06-05 ENCOUNTER — Other Ambulatory Visit (HOSPITAL_COMMUNITY): Payer: Self-pay

## 2023-06-09 ENCOUNTER — Other Ambulatory Visit: Payer: Self-pay | Admitting: Family

## 2023-06-09 MED ORDER — AMLODIPINE BESYLATE 10 MG PO TABS: 10.0000 mg | ORAL_TABLET | Freq: Every day | ORAL | 2 refills | Status: DC

## 2023-06-10 DIAGNOSIS — M1811 Unilateral primary osteoarthritis of first carpometacarpal joint, right hand: Secondary | ICD-10-CM | POA: Diagnosis not present

## 2023-06-10 DIAGNOSIS — H2513 Age-related nuclear cataract, bilateral: Secondary | ICD-10-CM | POA: Diagnosis not present

## 2023-06-10 DIAGNOSIS — H353131 Nonexudative age-related macular degeneration, bilateral, early dry stage: Secondary | ICD-10-CM | POA: Diagnosis not present

## 2023-06-12 DIAGNOSIS — M791 Myalgia, unspecified site: Secondary | ICD-10-CM | POA: Diagnosis not present

## 2023-06-12 DIAGNOSIS — M546 Pain in thoracic spine: Secondary | ICD-10-CM | POA: Diagnosis not present

## 2023-06-12 DIAGNOSIS — M47896 Other spondylosis, lumbar region: Secondary | ICD-10-CM | POA: Diagnosis not present

## 2023-06-16 DIAGNOSIS — G4733 Obstructive sleep apnea (adult) (pediatric): Secondary | ICD-10-CM | POA: Diagnosis not present

## 2023-06-30 DIAGNOSIS — G4733 Obstructive sleep apnea (adult) (pediatric): Secondary | ICD-10-CM | POA: Diagnosis not present

## 2023-07-07 ENCOUNTER — Other Ambulatory Visit (HOSPITAL_COMMUNITY): Payer: Self-pay

## 2023-07-07 MED ORDER — WEGOVY 1 MG/0.5ML ~~LOC~~ SOAJ
1.0000 mg | SUBCUTANEOUS | 0 refills | Status: DC
  Filled 2023-07-07: qty 2, 28d supply, fill #0

## 2023-07-08 DIAGNOSIS — M7918 Myalgia, other site: Secondary | ICD-10-CM | POA: Diagnosis not present

## 2023-07-09 ENCOUNTER — Other Ambulatory Visit (HOSPITAL_COMMUNITY): Payer: Self-pay

## 2023-07-11 DIAGNOSIS — R7303 Prediabetes: Secondary | ICD-10-CM | POA: Diagnosis not present

## 2023-07-11 DIAGNOSIS — G720 Drug-induced myopathy: Secondary | ICD-10-CM | POA: Diagnosis not present

## 2023-07-11 DIAGNOSIS — Z0000 Encounter for general adult medical examination without abnormal findings: Secondary | ICD-10-CM | POA: Diagnosis not present

## 2023-07-11 DIAGNOSIS — E039 Hypothyroidism, unspecified: Secondary | ICD-10-CM | POA: Diagnosis not present

## 2023-07-11 DIAGNOSIS — Z9989 Dependence on other enabling machines and devices: Secondary | ICD-10-CM | POA: Diagnosis not present

## 2023-07-11 DIAGNOSIS — N1831 Chronic kidney disease, stage 3a: Secondary | ICD-10-CM | POA: Diagnosis not present

## 2023-07-11 DIAGNOSIS — I10 Essential (primary) hypertension: Secondary | ICD-10-CM | POA: Diagnosis not present

## 2023-07-11 DIAGNOSIS — I700 Atherosclerosis of aorta: Secondary | ICD-10-CM | POA: Diagnosis not present

## 2023-07-11 DIAGNOSIS — G4733 Obstructive sleep apnea (adult) (pediatric): Secondary | ICD-10-CM | POA: Diagnosis not present

## 2023-07-11 DIAGNOSIS — M069 Rheumatoid arthritis, unspecified: Secondary | ICD-10-CM | POA: Diagnosis not present

## 2023-07-11 DIAGNOSIS — E7800 Pure hypercholesterolemia, unspecified: Secondary | ICD-10-CM | POA: Diagnosis not present

## 2023-07-18 ENCOUNTER — Ambulatory Visit (HOSPITAL_BASED_OUTPATIENT_CLINIC_OR_DEPARTMENT_OTHER): Payer: Medicare HMO | Admitting: Cardiology

## 2023-07-18 ENCOUNTER — Other Ambulatory Visit (HOSPITAL_COMMUNITY): Payer: Self-pay

## 2023-07-18 VITALS — BP 126/74 | HR 88 | Ht 63.0 in | Wt 204.7 lb

## 2023-07-18 DIAGNOSIS — I2510 Atherosclerotic heart disease of native coronary artery without angina pectoris: Secondary | ICD-10-CM

## 2023-07-18 DIAGNOSIS — R0789 Other chest pain: Secondary | ICD-10-CM

## 2023-07-18 DIAGNOSIS — I10 Essential (primary) hypertension: Secondary | ICD-10-CM | POA: Diagnosis not present

## 2023-07-18 DIAGNOSIS — E66812 Obesity, class 2: Secondary | ICD-10-CM | POA: Diagnosis not present

## 2023-07-18 DIAGNOSIS — I700 Atherosclerosis of aorta: Secondary | ICD-10-CM

## 2023-07-18 DIAGNOSIS — E782 Mixed hyperlipidemia: Secondary | ICD-10-CM | POA: Diagnosis not present

## 2023-07-18 MED ORDER — WEGOVY 1 MG/0.5ML ~~LOC~~ SOAJ
1.0000 mg | SUBCUTANEOUS | 0 refills | Status: DC
  Filled 2023-07-18 – 2023-07-25 (×2): qty 2, 28d supply, fill #0

## 2023-07-18 NOTE — Progress Notes (Unsigned)
Cardiology Office Note:  .   Date:  07/18/2023  ID:  Elizabeth Roman, DOB 06-03-1951, MRN [id number] PCP: Georgann Housekeeper, MD  [facility] Providers Cardiologist:  Jodelle Red, MD {  History of Present Illness: .   Elizabeth Roman is a 72 y.o. female with PMH nonobstructive CAD, rheumatoid arthritis, hypertension, mixed hyperlipidemia, obesity, OSA. She was previously followed by Dr. Shari Prows and established care with me on 07/18/23.  Pertinent CV history: Prior history of lightheadedness; in 2022 monitor was unremarkable, echo with normal LVEF, mild MR. Calcium score 2023 ws 674 (92nd percentile) with aortic atherosclerosis. Small hiatal hernia also noted. Had cardiac PET for DOE, no evidence of ischemia or infarct, normal MBFR  Today: Pending hand surgery on the right side in several weeks. Sometimes can radiate up her arm. Notes history of central chest pressure, has had evaluation for this as above, has been told it is GERD. Symptoms are better when she belches. Symptoms happen at night/when laying down. Stays active, walks, tries to swim. Lives with her daughter and 74 children, daughter has cancer. They are always busy.   ROS: Denies chest pain, shortness of breath at rest or with normal exertion. No PND, orthopnea, LE edema or unexpected weight gain. No syncope or palpitations. ROS otherwise negative except as noted.   Studies Reviewed: Marland Kitchen    EKG:       Physical Exam:   VS:  BP 126/74   Pulse 88   Ht 5\' 3"  (1.6 m)   Wt 204 lb 11.2 oz (92.9 kg)   SpO2 99%   BMI 36.26 kg/m    Wt Readings from Last 3 Encounters:  07/18/23 204 lb 11.2 oz (92.9 kg)  04/22/23 209 lb (94.8 kg)  03/21/23 206 lb (93.4 kg)    GEN: Well nourished, well developed in no acute distress HEENT: Normal, moist mucous membranes NECK: No JVD CARDIAC: regular rhythm, normal S1 and S2, no rubs or gallops. No murmur. VASCULAR: Radial and DP pulses 2+ bilaterally. No carotid bruits RESPIRATORY:  Clear  to auscultation without rales, wheezing or rhonchi  ABDOMEN: Soft, non-tender, non-distended MUSCULOSKELETAL:  Ambulates independently SKIN: Warm and dry, no edema NEUROLOGIC:  Alert and oriented x 3. No focal neuro deficits noted. PSYCHIATRIC:  Normal affect    ASSESSMENT AND PLAN: .    Nonobstructive CAD Aortic atherosclerosis Mixed hyperlipidemia -Ca score 672 which was the 92% for age, gender, race matched controls. TTE in 11/2020 with normal BiV function. NM PET 11/2021 normal with no ischemia or infarction. Normal LVEF. Normal myocardial blood flow. Unlikely that dyspnea is due to underlying CAD.  -Continue ASA 81mg  daily -Continue repatha -lipids 09/2022 with LDL 71 (down from 177), TG 184 -rheumatoid arthritis as additional risk factor -did not tolerate statins due to severe leg/muscle pain. Tried pravastatin, atorvastatin. Did not tolerate ezetimibe   Dyspnea Atypical chest pressure Reassuring cardiac work-up as above. TTE 11/2020 with normal BiV function and no significant valve disease. BNP normal and symptoms did not change with lasix. NM PET normal with no ischemia, infarction and normal EF and MBFR.  -on PPI -reviewed red flag warning signs that need immediate medical attention   Hypertension Well controlled at home. -continue losartan 100 mg daily -Continue amlodipine 10mg  daily   Obesity -current BMI 36 -On Wegovy, started several months ago. On 1 mg dose currently. Peak weight 220 lbs, now 204 lbs.  CV risk counseling and prevention -recommend heart healthy/Mediterranean diet, with whole grains, fruits, vegetable, fish,  lean meats, nuts, and olive oil. Limit salt. -recommend moderate walking, 3-5 times/week for 30-50 minutes each session. Aim for at least 150 minutes.week. Goal should be pace of 3 miles/hours, or walking 1.5 miles in 30 minutes -recommend avoidance of tobacco products. Avoid excess alcohol.  Dispo: 12 months or sooner as  needed  Signed, Jodelle Red, MD   Jodelle Red, MD, PhD, [facility]  [facility]  [place]  [facility] at [facility] at [facility] [address], [address] [place], [place] [address] [phone]

## 2023-07-18 NOTE — Patient Instructions (Signed)

## 2023-07-21 ENCOUNTER — Other Ambulatory Visit (HOSPITAL_COMMUNITY): Payer: Self-pay

## 2023-07-25 ENCOUNTER — Other Ambulatory Visit (HOSPITAL_COMMUNITY): Payer: Self-pay

## 2023-07-28 ENCOUNTER — Ambulatory Visit: Payer: Medicare HMO | Admitting: Family Medicine

## 2023-07-31 DIAGNOSIS — G4733 Obstructive sleep apnea (adult) (pediatric): Secondary | ICD-10-CM | POA: Diagnosis not present

## 2023-08-10 ENCOUNTER — Encounter (HOSPITAL_BASED_OUTPATIENT_CLINIC_OR_DEPARTMENT_OTHER): Payer: Self-pay | Admitting: Cardiology

## 2023-08-31 DIAGNOSIS — G4733 Obstructive sleep apnea (adult) (pediatric): Secondary | ICD-10-CM | POA: Diagnosis not present

## 2023-09-03 ENCOUNTER — Other Ambulatory Visit (HOSPITAL_COMMUNITY): Payer: Self-pay

## 2023-09-03 MED ORDER — WEGOVY 1 MG/0.5ML ~~LOC~~ SOAJ
1.0000 mg | SUBCUTANEOUS | 0 refills | Status: DC
  Filled 2023-09-03 – 2023-09-16 (×3): qty 2, 28d supply, fill #0

## 2023-09-08 ENCOUNTER — Other Ambulatory Visit (HOSPITAL_COMMUNITY): Payer: Self-pay

## 2023-09-09 ENCOUNTER — Telehealth: Payer: Self-pay | Admitting: Psychiatry

## 2023-09-09 NOTE — Telephone Encounter (Signed)
Pt left a vm asking to r/s her appointment

## 2023-09-15 ENCOUNTER — Other Ambulatory Visit (HOSPITAL_COMMUNITY): Payer: Self-pay

## 2023-09-16 ENCOUNTER — Other Ambulatory Visit: Payer: Self-pay

## 2023-09-16 ENCOUNTER — Other Ambulatory Visit (HOSPITAL_COMMUNITY): Payer: Self-pay

## 2023-09-23 DIAGNOSIS — H25013 Cortical age-related cataract, bilateral: Secondary | ICD-10-CM | POA: Diagnosis not present

## 2023-09-23 DIAGNOSIS — H2511 Age-related nuclear cataract, right eye: Secondary | ICD-10-CM | POA: Diagnosis not present

## 2023-09-23 DIAGNOSIS — H25043 Posterior subcapsular polar age-related cataract, bilateral: Secondary | ICD-10-CM | POA: Diagnosis not present

## 2023-09-23 DIAGNOSIS — H18413 Arcus senilis, bilateral: Secondary | ICD-10-CM | POA: Diagnosis not present

## 2023-09-23 DIAGNOSIS — H2513 Age-related nuclear cataract, bilateral: Secondary | ICD-10-CM | POA: Diagnosis not present

## 2023-09-24 ENCOUNTER — Telehealth: Payer: Self-pay | Admitting: Cardiology

## 2023-09-24 NOTE — Telephone Encounter (Signed)
 Spoke to patient she stated she has been having sob,chest tightness off and on for the past 1 month.She also has a frequent productive cough for the past 1 month,coughing clear phlegm.She felt worse today.No chest tightness and sob at present.She also has tingling in both hands and feet off and on.Appointment scheduled with scheduled with Dr.Christopher 3/5 at 2:20 pm.She will call PCP tomorrow and schedule appointment with him.Advised if symptoms worsen she will need to go to ED.

## 2023-09-24 NOTE — Telephone Encounter (Signed)
 Pt c/o Shortness Of Breath: STAT if SOB developed within the last 24 hours or pt is noticeably SOB on the phone  1. Are you currently SOB (can you hear that pt is SOB on the phone)? No  2. How long have you been experiencing SOB? This morning  3. Are you SOB when sitting or when up moving around? Up moving around  4. Are you currently experiencing any other symptoms? Tingling in fingers and back

## 2023-10-01 ENCOUNTER — Other Ambulatory Visit (HOSPITAL_COMMUNITY): Payer: Self-pay

## 2023-10-01 ENCOUNTER — Ambulatory Visit (HOSPITAL_BASED_OUTPATIENT_CLINIC_OR_DEPARTMENT_OTHER): Payer: Medicare HMO | Admitting: Cardiology

## 2023-10-01 ENCOUNTER — Encounter (HOSPITAL_BASED_OUTPATIENT_CLINIC_OR_DEPARTMENT_OTHER): Payer: Self-pay | Admitting: Cardiology

## 2023-10-01 VITALS — BP 98/62 | HR 105 | Ht 63.0 in | Wt 198.6 lb

## 2023-10-01 DIAGNOSIS — R053 Chronic cough: Secondary | ICD-10-CM

## 2023-10-01 DIAGNOSIS — R0789 Other chest pain: Secondary | ICD-10-CM | POA: Diagnosis not present

## 2023-10-01 DIAGNOSIS — R0602 Shortness of breath: Secondary | ICD-10-CM

## 2023-10-01 DIAGNOSIS — I10 Essential (primary) hypertension: Secondary | ICD-10-CM

## 2023-10-01 DIAGNOSIS — I2510 Atherosclerotic heart disease of native coronary artery without angina pectoris: Secondary | ICD-10-CM | POA: Diagnosis not present

## 2023-10-01 NOTE — Progress Notes (Signed)
 Cardiology Office Note:  .   Date:  10/01/2023  ID:  Elizabeth Roman, DOB 08/16/1950, MRN [id number] PCP: Georgann Housekeeper, MD  [facility] Providers Cardiologist:  Jodelle Red, MD {  History of Present Illness: .   Elizabeth Roman is a 73 y.o. female with PMH nonobstructive CAD,fibromyalgia,  rheumatoid arthritis, hypertension, mixed hyperlipidemia, obesity, OSA. She was previously followed by Dr. Shari Prows and established care with me on 07/18/23.  Pertinent CV history: Prior history of lightheadedness; in 2022 monitor was unremarkable, echo with normal LVEF, mild MR. Calcium score 2023 was 674 (92nd percentile) with aortic atherosclerosis. Small hiatal hernia also noted. 11/2021 had cardiac PET for DOE, no evidence of ischemia or infarct, normal MBFR  Today: Seen for urgent visit. Called into the office last week with shortness of breath and chest tightness on and off for the last month as well as productive cough. Recommended to speak to PCP as well and scheduled for urgent visit.  Throat is hoarse today. Notes that she has had a constant cough for months, produces clear phlegm. Feels short of breath at many different times, sometimes laying down. With exertion, can either start out short of breath and then it improves with walking, but other times the shortness of breath worsens with more walking. Better if she rests or uses the albuterol inhaler. Never short of breathing when she is swimming. No fevers/chills. Does have a lot of nasal drainage; used flonase in the past and didn't feel that it helped.  Has intermittent chest pressure, better when she belches. Chest pressure doesn't occur with exertion.  Has L leg pain when she sleeps. Has chronic back pain/arthritis that limits her as well. Has chronic intermittent dizziness as well.  Leaving soon and will not be back until mid-April.  ROS: Denies PND, orthopnea, LE edema or unexpected weight gain. No syncope or palpitations. ROS  otherwise negative except as noted.   Studies Reviewed: Marland Kitchen    EKG:  EKG Interpretation Date/Time:  Wednesday October 01 2023 14:32:25 EST Ventricular Rate:  98 PR Interval:  134 QRS Duration:  92 QT Interval:  330 QTC Calculation: 421 R Axis:   69  Text Interpretation: Normal sinus rhythm Normal ECG When compared with ECG of 21-Mar-2023 11:15, No significant change was found Confirmed by Jodelle Red [id number]) on 10/01/2023 2:38:47 PM    Physical Exam:   VS:  BP 98/62   Pulse (!) 105   Ht 5\' 3"  (1.6 m)   Wt 198 lb 9.6 oz (90.1 kg)   SpO2 95%   BMI 35.18 kg/m    Wt Readings from Last 3 Encounters:  10/01/23 198 lb 9.6 oz (90.1 kg)  07/18/23 204 lb 11.2 oz (92.9 kg)  04/22/23 209 lb (94.8 kg)    GEN: Well nourished, well developed in no acute distress HEENT: Normal, moist mucous membranes NECK: No JVD CARDIAC: regular rhythm, normal S1 and S2, no rubs or gallops. No murmur. VASCULAR: Radial and DP pulses 2+ bilaterally. No carotid bruits RESPIRATORY:  Clear to auscultation without rales, wheezing or rhonchi  ABDOMEN: Soft, non-tender, non-distended MUSCULOSKELETAL:  Ambulates independently SKIN: Warm and dry, no edema NEUROLOGIC:  Alert and oriented x 3. No focal neuro deficits noted. PSYCHIATRIC:  Normal affect    ASSESSMENT AND PLAN: .    Dyspnea Atypical chest pressure -seen for urgent visit, but on review symptoms are similar to what she has been dealing with chronically from a cardiac perspective. Cough/throat hoarseness/shortness of breath noted as worse,  improve with inhaler. Recommended she speak with PCP or pulm, but she is leaving for several weeks and does not want to reach out to them before she leaves. -Reassuring cardiac work-up as above. TTE 11/2020 with normal BiV function and no significant valve disease. BNP normal and symptoms did not change with lasix. NM PET  normal with no ischemia, infarction and normal EF and MBFR.  -on PPI -reviewed red flag  warning signs that need immediate medical attention  Nonobstructive CAD Aortic atherosclerosis Mixed hyperlipidemia -Ca score 672 which was the 92% for age, gender, race matched controls. TTE in 11/2020 with normal BiV function. NM PET 11/2021 normal with no ischemia or infarction. Normal LVEF. Normal myocardial blood flow. Unlikely that dyspnea is due to underlying CAD.  -Continue ASA 81mg  daily -Continue repatha -lipids 09/2022 with LDL 71 (down from 177), TG 184 -rheumatoid arthritis as additional risk factor -did not tolerate statins due to severe leg/muscle pain. Tried pravastatin, atorvastatin. Did not tolerate ezetimibe    Hypertension -well controlled, typically higher than today but she denies symptoms today. Recommended monitoring at home, contacting me if she has lightheadedness/dizziness -continue losartan 100 mg daily -Continue amlodipine 10mg  daily   Obesity -current BMI 35 -On [place], followed by wellness center. Peak weight 220 lbs, now 204 lbs.  CV risk counseling and prevention -recommend heart healthy/Mediterranean diet, with whole grains, fruits, vegetable, fish, lean meats, nuts, and olive oil. Limit salt. -recommend moderate walking, 3-5 times/week for 30-50 minutes each session. Aim for at least 150 minutes.week. Goal should be pace of 3 miles/hours, or walking 1.5 miles in 30 minutes -recommend avoidance of tobacco products. Avoid excess alcohol.  Dispo: keep scheduled 12 mos follow up (06/2024), contact sooner with change in symptoms. Recommended discussing her respiratory concerns with PCP and pulmonary teams  Signed, Jodelle Red, MD   Jodelle Red, MD, PhD, [facility]  [facility]  [place]  Heart & Vascular at [facility] at [facility] [address] [place], [place] [address] [phone]

## 2023-10-01 NOTE — Patient Instructions (Signed)
 Medication Instructions:  Your physician recommends that you continue on your current medications as directed. Please refer to the Current Medication list given to you today.  *If you need a refill on your cardiac medications before your next appointment, please call your pharmacy*  Follow-Up: At [facility] ([facility]), you and your health needs are our priority.  As part of our continuing mission to provide you with exceptional heart care, we have created designated Provider Care Teams.  These Care Teams include your primary Cardiologist (physician) and Advanced Practice Providers (APPs -  Physician Assistants and Nurse Practitioners) who all work together to provide you with the care you need, when you need it.  We recommend signing up for the patient portal called "MyChart".  Sign up information is provided on this After Visit Summary.  MyChart is used to connect with patients for Virtual Visits (Telemedicine).  Patients are able to view lab/test results, encounter notes, upcoming appointments, etc.  Non-urgent messages can be sent to your provider as well.   To learn more about what you can do with MyChart, go to [email or web address].    Your next appointment:   Around Dec 2025 (recall placed)  Provider:   Jodelle Red, MD

## 2023-10-02 ENCOUNTER — Other Ambulatory Visit (HOSPITAL_COMMUNITY): Payer: Self-pay

## 2023-10-02 MED ORDER — WEGOVY 1 MG/0.5ML ~~LOC~~ SOAJ
1.0000 mg | SUBCUTANEOUS | 0 refills | Status: DC
  Filled 2023-10-02 – 2023-10-13 (×4): qty 2, 28d supply, fill #0

## 2023-10-03 ENCOUNTER — Other Ambulatory Visit (HOSPITAL_COMMUNITY): Payer: Self-pay

## 2023-10-06 ENCOUNTER — Other Ambulatory Visit (HOSPITAL_COMMUNITY): Payer: Self-pay

## 2023-10-06 MED ORDER — WEGOVY 1 MG/0.5ML ~~LOC~~ SOAJ
1.0000 mg | SUBCUTANEOUS | 0 refills | Status: DC
  Filled 2023-10-06: qty 2, 28d supply, fill #0

## 2023-10-07 ENCOUNTER — Other Ambulatory Visit (HOSPITAL_COMMUNITY): Payer: Self-pay

## 2023-10-13 ENCOUNTER — Other Ambulatory Visit: Payer: Self-pay

## 2023-10-13 ENCOUNTER — Other Ambulatory Visit (HOSPITAL_COMMUNITY): Payer: Self-pay

## 2023-10-16 ENCOUNTER — Other Ambulatory Visit (HOSPITAL_COMMUNITY): Payer: Self-pay

## 2023-10-17 ENCOUNTER — Other Ambulatory Visit (HOSPITAL_COMMUNITY): Payer: Self-pay

## 2023-11-10 ENCOUNTER — Ambulatory Visit: Payer: Medicare HMO | Admitting: Family Medicine

## 2023-11-24 ENCOUNTER — Encounter: Payer: Self-pay | Admitting: Adult Health

## 2023-11-24 ENCOUNTER — Ambulatory Visit: Admitting: Adult Health

## 2023-11-24 VITALS — BP 118/78 | HR 93 | Temp 97.9°F | Ht 63.0 in | Wt 202.0 lb

## 2023-11-24 DIAGNOSIS — R053 Chronic cough: Secondary | ICD-10-CM | POA: Diagnosis not present

## 2023-11-24 DIAGNOSIS — J4530 Mild persistent asthma, uncomplicated: Secondary | ICD-10-CM

## 2023-11-24 NOTE — Patient Instructions (Addendum)
 Begin Claritin 5mg  daily  Continue on Protonix daily  Begin Liquid Mucinex DM 2 tsp Twice daily  for cough As needed   Recommend to increase Symbicort  2 puffs Twice daily, rinse after use.  Albuterol  inhaler As needed   Set up HRCT chest .  Follow up with Dr. Waylan Haggard in 3 months -30 min slot

## 2023-11-24 NOTE — Progress Notes (Unsigned)
 @Patient  ID: Elizabeth Roman, female    DOB: [birth year], 73 y.o.   MRN: [id number]  Chief Complaint  Patient presents with   Follow-up    Referring provider: Jearldine Mina, MD  HPI: 73 year old female never smoker followed for asthma, chronic cough, obstructive sleep apnea  TEST/EVENTS :  Chest x-ray March 20, 2023 mild right basilar atelectasis   11/24/2023 Follow up ; Asthma, Cough , OSA    Takes symbicort  in am .   Gets winded with activity ,  Uses albuterol  couple times a week   Has helped with dyspnea.   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  + basement or  No hot tub.   Swims 2 x week.  Never smoker  Has RA -uses only natural supplement  Has FM   Back pain , compression deformity - discuss with pcp     Allergies  Allergen Reactions   Morphine And Codeine Anaphylaxis   Prednisone Hives and Other (See Comments)   Atorvastatin Other (See Comments)   Codeine Other (See Comments)    Very ill.   Hydroxychloroquine Sulfate     Other reaction(s): no benefit   Ivp Dye [Iodinated Contrast Media] Hives   Phenobarbital Hives   Propoxyphene    Sulfa Antibiotics Hives   Vicodin [Hydrocodone-Acetaminophen] Other (See Comments)    Very ill.     There is no immunization history on file for this patient.  Past Medical History:  Diagnosis Date   Arthritis    High cholesterol    Hypertension    Left hip pain 09/2019   Multiple joint pain 03/2020   HANDICAP PLACARD    Sleep apnea    Thyroid  disease     Tobacco History: Social History   Tobacco Use  Smoking Status Never  Smokeless Tobacco Never   Counseling given: Not Answered   Outpatient Medications Prior to Visit  Medication Sig Dispense Refill   albuterol  (VENTOLIN  HFA) 108  (90 Base) MCG/ACT inhaler Inhale 2 puffs into the lungs every 6 (six) hours as needed for wheezing or shortness of breath. 8 g 3   amLODipine  (NORVASC ) 10 MG tablet Take 1 tablet (10 mg total) by mouth at bedtime. 90 tablet 2   Bromelain 250 MG CAPS Take by mouth daily.     budesonide -formoterol  (SYMBICORT ) 80-4.5 MCG/ACT inhaler Inhale 2 puffs into the lungs in the morning and at bedtime. 1 each 3   Coenzyme Q10 (CO Q-10) 200 MG CAPS Take 400 mg by mouth at bedtime.     Cranberry 500 MG CAPS Take by mouth. 2 times per day     Evolocumab  (REPATHA  SURECLICK) 140 MG/ML SOAJ Inject 140 mg into the skin every 14 (fourteen) days. 6 mL 3   ferrous sulfate 325 (65 FE) MG tablet daily.     levothyroxine (SYNTHROID, LEVOTHROID) 75 MCG tablet Take 75 mcg by mouth daily before breakfast.     losartan (COZAAR) 100 MG tablet 1 tablet Orally Once a day for 90 days     MAGNESIUM PO Take 400 mg by mouth daily.     Multiple Vitamins-Minerals (ANTIOXIDANT PO) Take by mouth. 3 cap fulls daily     OVER THE COUNTER MEDICATION Stool softener  250 mg at bedtime     OVER THE COUNTER MEDICATION Glucosamine 2000mg  daily. Patient stated "taking in powder form"     pantoprazole (PROTONIX) 40 MG tablet Take 40 mg by mouth daily as needed (indigestion).     Semaglutide -Weight Management (WEGOVY ) 1 MG/0.5ML SOAJ Inject 1 mg into the skin once a week. 2 mL 0   Semaglutide -Weight Management (WEGOVY ) 1 MG/0.5ML SOAJ Inject 1 mg into the skin once a week. 2 mL 0   Turmeric (CURCUMIN 95 PO) Take 1 tablet by mouth 2 (two) times daily.     VITAMIN B COMPLEX-C CAPS Take 2 capsules by mouth daily.     vitamin C (ASCORBIC ACID) 500 MG tablet Take 500 mg by mouth 2 (two) times daily.     VITAMIN D PO Take by mouth daily. Per patient taking 5,000 units taking every evening/     zinc gluconate 50 MG tablet Take 50 mg by mouth daily.     fluticasone (FLONASE) 50 MCG/ACT nasal spray Administer 2 sprays in each nostril daily.      Semaglutide -Weight Management 0.5 MG/0.5ML SOAJ Inject 0.5 mg into the skin once a week. (Patient not taking: Reported on 07/18/2023) 2 mL 0   No facility-administered medications prior to visit.     Review of Systems:   Constitutional:   No  weight loss, night sweats,  Fevers, chills, fatigue, or  lassitude.  HEENT:   No headaches,  Difficulty swallowing,  Tooth/dental problems, or  Sore throat,                No sneezing, itching, ear ache, nasal congestion, post nasal drip,   CV:  No chest pain,  Orthopnea, PND, swelling in lower extremities, anasarca, dizziness, palpitations, syncope.   GI  No heartburn, indigestion, abdominal pain, nausea, vomiting, diarrhea, change in bowel habits, loss of appetite, bloody stools.   Resp: No shortness of breath with exertion or at rest.  No excess mucus, no productive cough,  No non-productive cough,  No coughing up of blood.  No change in color of mucus.  No wheezing.  No chest wall deformity  Skin: no rash or lesions.  GU: no dysuria, change in color of urine, no urgency or frequency.  No flank pain, no hematuria   MS:  No joint pain or swelling.  No decreased range of motion.  No back pain.    Physical Exam  BP 118/78 (BP Location: Left Arm, Patient Position: Sitting, Cuff Size: Normal)   Pulse 93   Temp 97.9 F (36.6 C) (Oral)   Ht 5\' 3"  (1.6 m)   Wt 202 lb (91.6 kg)   SpO2 96%   BMI 35.78 kg/m   GEN: A/Ox3; pleasant , NAD, well nourished    HEENT:  Ali Molina/AT,  EACs-clear, TMs-wnl, NOSE-clear, THROAT-clear, no lesions, no postnasal drip or exudate noted.   NECK:  Supple w/ fair ROM; no JVD; normal carotid impulses w/o bruits; no thyromegaly or nodules palpated; no lymphadenopathy.    RESP  Clear  P & A; w/o, wheezes/ rales/ or rhonchi. no accessory muscle use, no dullness to percussion  CARD:  RRR, no m/r/g, no peripheral edema, pulses intact, no cyanosis or clubbing.  GI:   Soft & nt; nml bowel sounds; no organomegaly or masses  detected.   Musco: Warm bil, no deformities or joint swelling noted.   Neuro: alert, no focal deficits noted.    Skin: Warm, no lesions or rashes    Lab Results:  CBC    Component Value Date/Time   WBC 11.6 (H) 07/06/2020 1119   RBC 4.61 07/06/2020 1119   HGB 13.3 07/06/2020 1119   HCT 42.4 07/06/2020 1119   PLT 248 07/06/2020 1119   MCV 92.0 07/06/2020 1119   MCH 28.9 07/06/2020 1119   MCHC 31.4 07/06/2020 1119   RDW 13.3 07/06/2020 1119   LYMPHSABS 1.5 09/24/2018 2057   MONOABS 0.7 09/24/2018 2057   EOSABS 0.6 (H) 09/24/2018 2057   BASOSABS 0.0 09/24/2018 2057    BMET    Component Value Date/Time   NA 138 11/26/2021 1354   K 4.9 11/26/2021 1354   CL 104 11/26/2021 1354   CO2 20 11/26/2021 1354   GLUCOSE 100 (H) 11/26/2021 1354   GLUCOSE 61 (L) 07/06/2020 1119   BUN 29 (H) 11/26/2021 1354   CREATININE 1.03 (H) 11/26/2021 1354   CALCIUM 9.8 11/26/2021 1354   GFRNONAA >60 07/06/2020 1119   GFRAA >60 09/24/2018 2057    BNP No results found for: "BNP"  ProBNP    Component Value Date/Time   PROBNP 114 11/09/2021 1251    Imaging: No results found.  Administration History     None          Latest Ref Rng & Units 02/05/2022   10:05 AM  PFT Results  FVC-Pre L 3.07   FVC-Predicted Pre % 113   FVC-Post L 2.93   FVC-Predicted Post % 108   Pre FEV1/FVC % % 69   Post FEV1/FCV % % 73   FEV1-Pre L 2.13   FEV1-Predicted Pre % 104   FEV1-Post L 2.15   DLCO uncorrected ml/min/mmHg 17.78   DLCO UNC% % 97   DLCO corrected ml/min/mmHg 17.78   DLCO COR %Predicted % 97   DLVA Predicted % 80   TLC L 6.23   TLC % Predicted % 129   RV % Predicted % 143     No results found for: "NITRICOXIDE"      Assessment & Plan:   No problem-specific Assessment & Plan notes found for this encounter.     Roena Clark, NP 11/24/2023

## 2023-11-25 DIAGNOSIS — R053 Chronic cough: Secondary | ICD-10-CM | POA: Insufficient documentation

## 2023-11-25 NOTE — Assessment & Plan Note (Signed)
 Possible mild persistent asthma.  PFTs were unrevealing and the never smoker.  No airflow obstruction or restriction.  Recommend increasing Symbicort  up to 2 puffs twice daily.  Inhaler oral care discussed. Control for triggers including chronic rhinitis and GERD.  Recommend Claritin daily.  Also cough control with liquid Mucinex DM.  Asthma action plan with albuterol  as needed  Plan  Patient Instructions  Begin Claritin 5mg  daily  Continue on Protonix daily  Begin Liquid Mucinex DM 2 tsp Twice daily  for cough As needed   Recommend to increase Symbicort  2 puffs Twice daily, rinse after use.  Albuterol  inhaler As needed   Set up HRCT chest .  Follow up with Dr. Waylan Haggard in 3 months -30 min slot

## 2023-11-25 NOTE — Assessment & Plan Note (Signed)
 Chronic cough, shortness of breath questionable etiology-has reported history of rheumatoid arthritis.-Check high-resolution CT chest to rule out interstitial lung disease.  Continue on cough control regimen-would add Claritin daily.  Continue on Protonix, add liquid Mucinex DM.  Maximize asthma control.  Plan  Patient Instructions  Begin Claritin 5mg  daily  Continue on Protonix daily  Begin Liquid Mucinex DM 2 tsp Twice daily  for cough As needed   Recommend to increase Symbicort  2 puffs Twice daily, rinse after use.  Albuterol  inhaler As needed   Set up HRCT chest .  Follow up with Dr. Waylan Haggard in 3 months -30 min slot

## 2023-12-02 DIAGNOSIS — E669 Obesity, unspecified: Secondary | ICD-10-CM | POA: Diagnosis not present

## 2023-12-02 DIAGNOSIS — Z6835 Body mass index (BMI) 35.0-35.9, adult: Secondary | ICD-10-CM | POA: Diagnosis not present

## 2023-12-02 DIAGNOSIS — I700 Atherosclerosis of aorta: Secondary | ICD-10-CM | POA: Diagnosis not present

## 2023-12-02 DIAGNOSIS — R7303 Prediabetes: Secondary | ICD-10-CM | POA: Diagnosis not present

## 2023-12-02 DIAGNOSIS — I10 Essential (primary) hypertension: Secondary | ICD-10-CM | POA: Diagnosis not present

## 2023-12-03 ENCOUNTER — Ambulatory Visit (HOSPITAL_COMMUNITY)
Admission: RE | Admit: 2023-12-03 | Discharge: 2023-12-03 | Disposition: A | Discharge status: Home / Self Care | Source: Ambulatory Visit | Attending: Adult Health | Admitting: Adult Health

## 2023-12-03 DIAGNOSIS — R918 Other nonspecific abnormal finding of lung field: Secondary | ICD-10-CM | POA: Diagnosis not present

## 2023-12-03 DIAGNOSIS — J84112 Idiopathic pulmonary fibrosis: Secondary | ICD-10-CM | POA: Diagnosis not present

## 2023-12-03 DIAGNOSIS — R053 Chronic cough: Secondary | ICD-10-CM

## 2023-12-12 DIAGNOSIS — M25552 Pain in left hip: Secondary | ICD-10-CM | POA: Insufficient documentation

## 2023-12-18 DIAGNOSIS — M7062 Trochanteric bursitis, left hip: Secondary | ICD-10-CM | POA: Diagnosis not present

## 2023-12-26 NOTE — Patient Instructions (Signed)
 Below is our plan:  We will continue to monitor. Consider Alpha lipoic acid for the neuropathy. You could also consider capsaicin. They have patches over the counter or you could consider Qutenza prescription if you wish.   Please make sure you are staying well hydrated. I recommend 50-60 ounces daily. Well balanced diet and regular exercise encouraged. Consistent sleep schedule with 6-8 hours recommended.   Please continue follow up with care team as directed.   Follow up with me as needed.   You may receive a survey regarding today's visit. I encourage you to leave honest feed back as I do use this information to improve patient care. Thank you for seeing me today!

## 2023-12-26 NOTE — Progress Notes (Signed)
 Chief Complaint  Patient presents with   RM1/NUMBNESS/TINGLING    Pt is here Alone. Pt states that things have been about the same. Pt states that her dizziness has gotten better.     HISTORY OF PRESENT ILLNESS:  12/29/23 ALL:  Elizabeth Roman returns for follow up. She was last seen by Dr Billy Bue 03/2023. EMG showed L5, S1 radiculopathy and severe carpal tunnel. MRI declined. Referral to ortho placed but PT declined.   Since, she reports symptoms are fairly stable. Dizziness has significantly improved. She has had some worsening of right CTS symptoms. She was scheduled for surgery but cancelled after her youngest sister died. She has misplaced her brace. She was seen by NS and received a ESI but not sure it helped much. She continues to have numbness in both feet. Some burning pain, L>R. Failed gabapentin years ago. She felt it made her too sleepy. She tries to stay active. She walks at least 7000 steps a day.   04/22/2023 JC:  73 year old female with a history of prediabetes, HTN, CAD, OSA who follows in clinic for imbalance and paresthesias. MRI brain 07/24/22 shows a small right frontal DVA and mild chronic microvascular ischemic changes. EMG 03/24/23 with severe carpal tunnel syndrome and left L5, S1 radiculopathy.   Interval History: She continues to have paresthesias in both feet and hands. Her entire hand will go numb on either side, and she will occasionally get shocking pains from her wrists down her hands. Struggles to open jars.    Continues to have back pain with sciatica which is worse with prolonged standing or walking uphill. Feet feel numb on both sides. Notes that she previously had an injection in her lower back which relieved her pain for several years.   She also reports a shocking sensation between shoulder blades.   EMG showed severe carpal tunnel and left L5, S1 radiculopathy. There was no evidence of large fiber polyneuropathy or cervical radiculopathy.   03/20/2023 ALL:   Elizabeth Roman is a 73 y.o. female here today for follow up for headaches and imbalance. She was seen in consult with Dr Billy Bue 06/2022. She complained of sharp stabbing headaches and intermittent dizziness. Exam showed dizziness elicited by Romberg with no sway and decreased vibratory sensation of bilateral feet. MRI was unremarkable. PT advised.   Since, she reports doing fairly well. She continues to have episodic dizziness. She denies imbalance concerns. She denies changes in gait. Most events occur when standing. She denies worsening with position changes or turning head. Events happen so quick, usually about 30 seconds. She has noticed events more after she eats. She is prediabetic. She is seeing a nutritionist. She is increasing protein and watching carbs. She was started on Wegovy  recently. She has been [facility].   Cardiology eval has been unremarkable.   She denies any history of headaches. She is unsure why this was mentioned in last note.    She has lumbar pain in the center of her back. No radicular symptoms. She has more pain in right flank area. Sharp/stabbing. She continues to have numbness and pain of both feet and hands. Unsure if one side is worse. She is followed by ortho for bilateral foot callouses, bunions and tibial tendonitis. S/P bilateral knee replacements. Per ortho's last note 01/2023, they were concerned of left sided radiculopathy. She denies left sided symptoms, today. Gabapentin was ineffective years ago.    HISTORY (copied  from Dr Margrette Shield previous note)  The patient presents for evaluation of dizziness which began ~1 year ago. Initially this was mild and intermittent. She then had a severe episode in October 2023. At that time she got out of her car and suddenly felt like someone was shoving her backward, then she fell on the driveway and hit her head. She did not seek medical evaluation afterwards.    Currently she has a brief dizzy spell almost daily. These only last for seconds at a time. She is usually standing when these occur. Denies a sensation of spinning, just feels "off-balance" and like she is going to fall. Reports that her feet and hands feel numb. She does have a history of prediabetes and suspects she has neuropathy. She also reports low back pain with occasional sciatica.    She has also developed daily headaches described as sharp pains on the right side of her head. These typically last 5-30 minutes at a time.   She saw ENT in October 2023 who recommended vestibular therapy, which she declined. Hearing test earlier this month was normal. Saw Cardiology in November who did not believe symptoms were cardiac or related to presyncope. Saw Pulmonology who did not believe symptoms were pulmonary related.   She does also report a mechanical fall on 07/2021. Had a CTH at that time which was unremarkable. She had already been getting dizzy spells prior to this fall.   OTHER MEDICAL CONDITIONS: prediabetes, HTN, CAD, OSA   REVIEW OF SYSTEMS: Out of a complete 14 system review of symptoms, the patient complains only of the following symptoms, dizziness, numbness of feet, muscle aches, joint pain and all other reviewed systems are negative.   ALLERGIES: Allergies  Allergen Reactions   Morphine And Codeine Anaphylaxis   Prednisone Hives and Other (See Comments)   Atorvastatin Other (See Comments)   Atorvastatin Calcium Other (See Comments)   Codeine Other (See Comments)    Very ill.   Hydroxychloroquine Sulfate     Other reaction(s): no benefit   Ivp Dye [Iodinated Contrast Media] Hives   Phenobarbital Hives   Propoxyphene    Sulfa Antibiotics Hives   Vicodin [Hydrocodone-Acetaminophen] Other (See Comments)    Very ill.     HOME MEDICATIONS: Outpatient Medications Prior to Visit  Medication Sig Dispense Refill   albuterol  (VENTOLIN  HFA) 108 (90 Base) MCG/ACT inhaler  Inhale 2 puffs into the lungs every 6 (six) hours as needed for wheezing or shortness of breath. 8 g 3   amLODipine  (NORVASC ) 10 MG tablet Take 1 tablet (10 mg total) by mouth at bedtime. 90 tablet 2   Bromelain 250 MG CAPS Take by mouth daily.     budesonide -formoterol  (SYMBICORT ) 80-4.5 MCG/ACT inhaler Inhale 2 puffs into the lungs in the morning and at bedtime. 1 each 3   Coenzyme Q10 (CO Q-10) 200 MG CAPS Take 400 mg by mouth at bedtime.     Cranberry 500 MG CAPS Take by mouth. 2 times per day     Evolocumab  (REPATHA  SURECLICK) 140 MG/ML SOAJ Inject 140 mg into the skin every 14 (fourteen) days. 6 mL 3   ferrous sulfate 325 (65 FE) MG tablet daily.     levothyroxine (SYNTHROID, LEVOTHROID) 75 MCG tablet Take 75 mcg by mouth daily before breakfast.     losartan (COZAAR) 100 MG tablet 1 tablet Orally Once a day for 90 days     MAGNESIUM PO Take 400 mg by mouth daily.     Multiple  Vitamins-Minerals (ANTIOXIDANT PO) Take by mouth. 3 cap fulls daily     OVER THE COUNTER MEDICATION Stool softener 250 mg at bedtime     OVER THE COUNTER MEDICATION Glucosamine 2000mg  daily. Patient stated "taking in powder form"     pantoprazole (PROTONIX) 40 MG tablet Take 40 mg by mouth daily as needed (indigestion).     Turmeric (CURCUMIN 95 PO) Take 1 tablet by mouth 2 (two) times daily.     VITAMIN B COMPLEX-C CAPS Take 2 capsules by mouth daily.     vitamin C (ASCORBIC ACID) 500 MG tablet Take 500 mg by mouth 2 (two) times daily.     VITAMIN D PO Take by mouth daily. Per patient taking 5,000 units taking every evening/     zinc gluconate 50 MG tablet Take 50 mg by mouth daily.     Semaglutide -Weight Management (WEGOVY ) 1 MG/0.5ML SOAJ Inject 1 mg into the skin once a week. (Patient not taking: Reported on 12/29/2023) 2 mL 0   Semaglutide -Weight Management (WEGOVY ) 1 MG/0.5ML SOAJ Inject 1 mg into the skin once a week. (Patient not taking: Reported on 12/29/2023) 2 mL 0   No facility-administered medications prior  to visit.     PAST MEDICAL HISTORY: Past Medical History:  Diagnosis Date   Arthritis    High cholesterol    Hypertension    Left hip pain 09/2019   Multiple joint pain 03/2020   HANDICAP PLACARD    Sleep apnea    Thyroid  disease      PAST SURGICAL HISTORY: Past Surgical History:  Procedure Laterality Date   ABDOMINAL HYSTERECTOMY     BREAST BIOPSY     BUNIONECTOMY     CARPAL TUNNEL RELEASE Left    HAND SURGERY Right    MEDIAL PARTIAL KNEE REPLACEMENT     TONSILLECTOMY       FAMILY HISTORY: Family History  Problem Relation Age of Onset   Breast cancer Mother    Allergies Mother    Asthma Mother    Heart disease Father    Breast cancer Daughter    Cancer Daughter        breast     SOCIAL HISTORY: Social History   Socioeconomic History   Marital status: Married    Spouse name: Not on file   Number of children: 8   Years of education: Not on file   Highest education level: Not on file  Occupational History   Occupation: RETIRED HAIR DRESSER  Tobacco Use   Smoking status: Never   Smokeless tobacco: Never  Vaping Use   Vaping status: Never Used  Substance and Sexual Activity   Alcohol use: Yes    Comment: occ   Drug use: No   Sexual activity: Not on file  Other Topics Concern   Not on file  Social History Narrative   Retired Interior and spatial designer    Social Drivers of Corporate investment banker Strain: Not on file  Food Insecurity: Not on file  Transportation Needs: Not on file  Physical Activity: Not on file  Stress: Not on file  Social Connections: Not on file  Intimate Partner Violence: Unknown (12/16/2021)   [facility]   Intimate Partner Violence    Fear of Current or Ex-Partner: Not on file     PHYSICAL EXAM  Vitals:   12/29/23 1303  BP: 131/71  Pulse: 72  Weight: 205 lb 8 oz (93.2 kg)  Height: 5\' 3"  (1.6 m)  Body mass index is 36.4 kg/m.  Generalized: Well developed, in no acute  distress  Cardiology: normal rate and rhythm, no murmur auscultated  Respiratory: clear to auscultation bilaterally    Neurological examination  Mentation: Alert oriented to time, place, history taking. Follows all commands speech and language fluent Cranial nerve II-XII: Pupils were equal round reactive to light. Extraocular movements were full, visual field were full on confrontational test. Facial sensation and strength were normal. Head turning and shoulder shrug  were normal and symmetric. Motor: The motor testing reveals 5 over 5 strength of all 4 extremities. Good symmetric motor tone is noted throughout.  Sensory: Sensory testing is intact to soft touch on all 4 extremities. No evidence of extinction is noted.  Gait and station: Able to push to standing position without difficulty. Gait is arthritic but stable with no assistive device. Three step turns. Tandem gait is unsteady Reflexes: Deep tendon reflexes are symmetric and normal bilaterally.    DIAGNOSTIC DATA (LABS, IMAGING, TESTING) - I reviewed patient records, labs, notes, testing and imaging myself where available.  Lab Results  Component Value Date   WBC 11.6 (H) 07/06/2020   HGB 13.3 07/06/2020   HCT 42.4 07/06/2020   MCV 92.0 07/06/2020   PLT 248 07/06/2020      Component Value Date/Time   NA 138 11/26/2021 1354   K 4.9 11/26/2021 1354   CL 104 11/26/2021 1354   CO2 20 11/26/2021 1354   GLUCOSE 100 (H) 11/26/2021 1354   GLUCOSE 61 (L) 07/06/2020 1119   BUN 29 (H) 11/26/2021 1354   CREATININE 1.03 (H) 11/26/2021 1354   CALCIUM 9.8 11/26/2021 1354   PROT 6.9 10/07/2022 1151   ALBUMIN 4.7 10/07/2022 1151   AST 16 10/07/2022 1151   ALT 16 10/07/2022 1151   ALKPHOS 90 10/07/2022 1151   BILITOT 0.5 10/07/2022 1151   GFRNONAA >60 07/06/2020 1119   GFRAA >60 09/24/2018 2057   Lab Results  Component Value Date   CHOL 161 10/07/2022   HDL 60 10/07/2022   LDLCALC 71 10/07/2022   TRIG 184 (H) 10/07/2022    CHOLHDL 2.7 10/07/2022   No results found for: "HGBA1C" Lab Results  Component Value Date   VITAMINB12 885 07/09/2022   Lab Results  Component Value Date   TSH 3.45 12/12/2021        No data to display               No data to display           ASSESSMENT AND PLAN  73 y.o. year old female  has a past medical history of Arthritis, High cholesterol, Hypertension, Left hip pain (09/2019), Multiple joint pain (03/2020), Sleep apnea, and Thyroid  disease. here with    Spinal stenosis of lumbar region, unspecified whether neurogenic claudication present  Bilateral carpal tunnel syndrome  Dizziness  Elizabeth Roman reports doing fairly well. Dizziness has significant improved. Rare events and events are much milder. She continues to have L>R CTS. I encouraged her to use a wrist brace and follow up with ortho to reschedule surgery. I discussed supplement management as well as consideration of Qutenza treatments for neuropathy as she is sensitive to prescription pain medications. Healthy lifestyle habits encouraged. She will follow up with care team as directed. She will return to see me as needed. She verbalizes understanding and agreement with this plan.   No orders of the defined types were placed in this encounter.    No orders  of the defined types were placed in this encounter.   I spent 30 minutes of face-to-face and non-face-to-face time with patient.  This included previsit chart review, lab review, study review, order entry, electronic health record documentation, patient education.   Terrilyn Fick, MSN, FNP-C 12/29/2023, 1:33 PM  [facility] [address] [place], [place] [address] [phone]

## 2023-12-29 ENCOUNTER — Ambulatory Visit: Admitting: Family Medicine

## 2023-12-29 ENCOUNTER — Encounter: Payer: Self-pay | Admitting: Family Medicine

## 2023-12-29 VITALS — BP 131/71 | HR 72 | Ht 63.0 in | Wt 205.5 lb

## 2023-12-29 DIAGNOSIS — G5603 Carpal tunnel syndrome, bilateral upper limbs: Secondary | ICD-10-CM | POA: Diagnosis not present

## 2023-12-29 DIAGNOSIS — M48061 Spinal stenosis, lumbar region without neurogenic claudication: Secondary | ICD-10-CM | POA: Diagnosis not present

## 2023-12-29 DIAGNOSIS — R42 Dizziness and giddiness: Secondary | ICD-10-CM | POA: Diagnosis not present

## 2023-12-31 DIAGNOSIS — I10 Essential (primary) hypertension: Secondary | ICD-10-CM | POA: Diagnosis not present

## 2023-12-31 DIAGNOSIS — J4530 Mild persistent asthma, uncomplicated: Secondary | ICD-10-CM | POA: Diagnosis not present

## 2023-12-31 DIAGNOSIS — G4733 Obstructive sleep apnea (adult) (pediatric): Secondary | ICD-10-CM | POA: Diagnosis not present

## 2023-12-31 DIAGNOSIS — I2510 Atherosclerotic heart disease of native coronary artery without angina pectoris: Secondary | ICD-10-CM | POA: Diagnosis not present

## 2023-12-31 DIAGNOSIS — J849 Interstitial pulmonary disease, unspecified: Secondary | ICD-10-CM | POA: Diagnosis not present

## 2023-12-31 DIAGNOSIS — N1831 Chronic kidney disease, stage 3a: Secondary | ICD-10-CM | POA: Diagnosis not present

## 2023-12-31 DIAGNOSIS — E7800 Pure hypercholesterolemia, unspecified: Secondary | ICD-10-CM | POA: Diagnosis not present

## 2023-12-31 DIAGNOSIS — M069 Rheumatoid arthritis, unspecified: Secondary | ICD-10-CM | POA: Diagnosis not present

## 2023-12-31 DIAGNOSIS — E039 Hypothyroidism, unspecified: Secondary | ICD-10-CM | POA: Diagnosis not present

## 2023-12-31 DIAGNOSIS — I700 Atherosclerosis of aorta: Secondary | ICD-10-CM | POA: Diagnosis not present

## 2023-12-31 DIAGNOSIS — K7460 Unspecified cirrhosis of liver: Secondary | ICD-10-CM | POA: Diagnosis not present

## 2023-12-31 DIAGNOSIS — G720 Drug-induced myopathy: Secondary | ICD-10-CM | POA: Diagnosis not present

## 2024-01-01 DIAGNOSIS — E669 Obesity, unspecified: Secondary | ICD-10-CM | POA: Diagnosis not present

## 2024-01-01 DIAGNOSIS — R7303 Prediabetes: Secondary | ICD-10-CM | POA: Diagnosis not present

## 2024-01-01 DIAGNOSIS — Z6835 Body mass index (BMI) 35.0-35.9, adult: Secondary | ICD-10-CM | POA: Diagnosis not present

## 2024-01-01 DIAGNOSIS — I10 Essential (primary) hypertension: Secondary | ICD-10-CM | POA: Diagnosis not present

## 2024-01-01 DIAGNOSIS — I700 Atherosclerosis of aorta: Secondary | ICD-10-CM | POA: Diagnosis not present

## 2024-01-02 DIAGNOSIS — H2512 Age-related nuclear cataract, left eye: Secondary | ICD-10-CM | POA: Diagnosis not present

## 2024-01-02 DIAGNOSIS — H2511 Age-related nuclear cataract, right eye: Secondary | ICD-10-CM | POA: Diagnosis not present

## 2024-01-06 DIAGNOSIS — K7460 Unspecified cirrhosis of liver: Secondary | ICD-10-CM | POA: Diagnosis not present

## 2024-01-15 DIAGNOSIS — H2512 Age-related nuclear cataract, left eye: Secondary | ICD-10-CM | POA: Diagnosis not present

## 2024-01-16 DIAGNOSIS — H2512 Age-related nuclear cataract, left eye: Secondary | ICD-10-CM | POA: Diagnosis not present

## 2024-01-16 DIAGNOSIS — H25042 Posterior subcapsular polar age-related cataract, left eye: Secondary | ICD-10-CM | POA: Diagnosis not present

## 2024-01-16 DIAGNOSIS — H25012 Cortical age-related cataract, left eye: Secondary | ICD-10-CM | POA: Diagnosis not present

## 2024-02-21 ENCOUNTER — Other Ambulatory Visit: Payer: Self-pay | Admitting: Family

## 2024-03-10 ENCOUNTER — Other Ambulatory Visit (HOSPITAL_COMMUNITY): Payer: Self-pay

## 2024-03-10 MED ORDER — BUDESONIDE-FORMOTEROL FUMARATE 80-4.5 MCG/ACT IN AERO
1.0000 | INHALATION_SPRAY | Freq: Two times a day (BID) | RESPIRATORY_TRACT | 5 refills | Status: DC
  Filled 2024-03-10 (×2): qty 10.2, 60d supply, fill #0

## 2024-03-11 ENCOUNTER — Other Ambulatory Visit (HOSPITAL_COMMUNITY): Payer: Self-pay

## 2024-03-16 ENCOUNTER — Ambulatory Visit: Payer: Medicare HMO | Admitting: Family Medicine

## 2024-03-31 DIAGNOSIS — I10 Essential (primary) hypertension: Secondary | ICD-10-CM | POA: Diagnosis not present

## 2024-03-31 DIAGNOSIS — R7303 Prediabetes: Secondary | ICD-10-CM | POA: Diagnosis not present

## 2024-03-31 DIAGNOSIS — E669 Obesity, unspecified: Secondary | ICD-10-CM | POA: Diagnosis not present

## 2024-03-31 DIAGNOSIS — Z6835 Body mass index (BMI) 35.0-35.9, adult: Secondary | ICD-10-CM | POA: Diagnosis not present

## 2024-03-31 DIAGNOSIS — I700 Atherosclerosis of aorta: Secondary | ICD-10-CM | POA: Diagnosis not present

## 2024-04-21 DIAGNOSIS — R7303 Prediabetes: Secondary | ICD-10-CM | POA: Diagnosis not present

## 2024-04-21 DIAGNOSIS — Z6835 Body mass index (BMI) 35.0-35.9, adult: Secondary | ICD-10-CM | POA: Diagnosis not present

## 2024-04-21 DIAGNOSIS — I10 Essential (primary) hypertension: Secondary | ICD-10-CM | POA: Diagnosis not present

## 2024-04-21 DIAGNOSIS — I700 Atherosclerosis of aorta: Secondary | ICD-10-CM | POA: Diagnosis not present

## 2024-04-21 DIAGNOSIS — E669 Obesity, unspecified: Secondary | ICD-10-CM | POA: Diagnosis not present

## 2024-04-26 ENCOUNTER — Other Ambulatory Visit: Payer: Self-pay | Admitting: Family

## 2024-04-26 DIAGNOSIS — I2510 Atherosclerotic heart disease of native coronary artery without angina pectoris: Secondary | ICD-10-CM

## 2024-04-26 DIAGNOSIS — E785 Hyperlipidemia, unspecified: Secondary | ICD-10-CM

## 2024-04-29 ENCOUNTER — Telehealth (HOSPITAL_BASED_OUTPATIENT_CLINIC_OR_DEPARTMENT_OTHER): Payer: Self-pay | Admitting: Cardiology

## 2024-04-29 ENCOUNTER — Ambulatory Visit: Payer: Self-pay

## 2024-04-29 NOTE — Telephone Encounter (Signed)
 Pt c/o Shortness Of Breath: STAT if SOB developed within the last 24 hours or pt is noticeably SOB on the phone  1. Are you currently SOB (can you hear that pt is SOB on the phone)?   No  2. How long have you been experiencing SOB? Patient noted she has been having SOB for months but it is getting worse  3. Are you SOB when sitting or when up moving around? When up and moving around  4. Are you currently experiencing any other symptoms?   Foggy head, lightheaded, sometimes dizzy   Patient stated she has been having SOB when walking which has been getting worse. Patient noted she has a dental appointment 10-11:00 am today.

## 2024-04-29 NOTE — Telephone Encounter (Signed)
 FYI Only or Action Required?: Action required by provider: request for appointment.  Patient was last seen in primary care on .  Called Nurse Triage reporting Breathing Problem.  Symptoms began several weeks ago.  Interventions attempted: Prescription medications:  SABRA  Symptoms are: gradually worsening. Has noticed increased SOB with exertion, walking. Using medications as prescribed.   Triage Disposition: See PCP When Office is Open (Within 3 Days)  Patient/caregiver understands and will follow disposition?:      Copied from CRM #[id number]. Topic: Clinical - Red Word Triage >> Apr 29, 2024  3:34 PM Whitney O wrote: [facility] that prompted transfer to Nurse Triage: shortness of breath . And tired more often    The last person she saw was Elizabeth Roman in [place]  she was seeing dr gladis but he not around again Reason for Disposition  [1] MODERATE longstanding difficulty breathing (e.g., speaks in phrases, SOB even at rest, pulse 100-120) AND [2] SAME as normal  Answer Assessment - Initial Assessment Questions 1. RESPIRATORY STATUS: Describe your breathing? (e.g., wheezing, shortness of breath, unable to speak, severe coughing)      SOB 2. ONSET: When did this breathing problem begin?      6 months 3. PATTERN Does the difficult breathing come and go, or has it been constant since it started?      COMES and goes 4. SEVERITY: How bad is your breathing? (e.g., mild, moderate, severe)      moderate 5. RECURRENT SYMPTOM: Have you had difficulty breathing before? If Yes, ask: When was the last time? and What happened that time?      yes 6. CARDIAC HISTORY: Do you have any history of heart disease? (e.g., heart attack, angina, bypass surgery, angioplasty)      yes 7. LUNG HISTORY: Do you have any history of lung disease?  (e.g., pulmonary embolus, asthma, emphysema)     yes 8. CAUSE: What do you think is causing the breathing problem?      unsure 9. OTHER  SYMPTOMS: Do you have any other symptoms? (e.g., chest pain, cough, dizziness, fever, runny nose)     cough 10. O2 SATURATION MONITOR:  Do you use an oxygen saturation monitor (pulse oximeter) at home? If Yes, ask: What is your reading (oxygen level) today? What is your usual oxygen saturation reading? (e.g., 95%)       95% 11. PREGNANCY: Is there any chance you are pregnant? When was your last menstrual period?       no 12. TRAVEL: Have you traveled out of the country in the last month? (e.g., travel history, exposures)       no  Protocols used: Breathing Difficulty-A-AH

## 2024-04-29 NOTE — Telephone Encounter (Signed)
 Spoke with patient who stated her shortness of breath is becoming more frequent and feels it is heart related. Inhaler does seem to help, advised to contact her pulmonologist  Scheduled appointment 05/17/2024 at 2:20 pm with Dr Lonni

## 2024-04-29 NOTE — Telephone Encounter (Signed)
 Patient is returning call.

## 2024-04-29 NOTE — Telephone Encounter (Signed)
 Left message to call back.

## 2024-05-04 ENCOUNTER — Other Ambulatory Visit: Payer: Self-pay | Admitting: Internal Medicine

## 2024-05-04 DIAGNOSIS — Z1231 Encounter for screening mammogram for malignant neoplasm of breast: Secondary | ICD-10-CM

## 2024-05-05 ENCOUNTER — Ambulatory Visit: Admitting: Nurse Practitioner

## 2024-05-05 ENCOUNTER — Encounter: Payer: Self-pay | Admitting: Nurse Practitioner

## 2024-05-05 VITALS — BP 136/84 | HR 80 | Temp 97.6°F | Ht 63.0 in | Wt 215.0 lb

## 2024-05-05 DIAGNOSIS — R490 Dysphonia: Secondary | ICD-10-CM

## 2024-05-05 DIAGNOSIS — J45909 Unspecified asthma, uncomplicated: Secondary | ICD-10-CM

## 2024-05-05 DIAGNOSIS — R0981 Nasal congestion: Secondary | ICD-10-CM

## 2024-05-05 DIAGNOSIS — J309 Allergic rhinitis, unspecified: Secondary | ICD-10-CM | POA: Diagnosis not present

## 2024-05-05 DIAGNOSIS — R0982 Postnasal drip: Secondary | ICD-10-CM

## 2024-05-05 DIAGNOSIS — R0609 Other forms of dyspnea: Secondary | ICD-10-CM | POA: Diagnosis not present

## 2024-05-05 DIAGNOSIS — J4540 Moderate persistent asthma, uncomplicated: Secondary | ICD-10-CM

## 2024-05-05 DIAGNOSIS — R918 Other nonspecific abnormal finding of lung field: Secondary | ICD-10-CM

## 2024-05-05 DIAGNOSIS — R635 Abnormal weight gain: Secondary | ICD-10-CM

## 2024-05-05 DIAGNOSIS — E6609 Other obesity due to excess calories: Secondary | ICD-10-CM

## 2024-05-05 DIAGNOSIS — R9389 Abnormal findings on diagnostic imaging of other specified body structures: Secondary | ICD-10-CM

## 2024-05-05 LAB — CBC WITH DIFFERENTIAL/PLATELET
Basophils Absolute: 0.1 K/uL (ref 0.0–0.1)
Basophils Relative: 0.9 % (ref 0.0–3.0)
Eosinophils Absolute: 0.2 K/uL (ref 0.0–0.7)
Eosinophils Relative: 3 % (ref 0.0–5.0)
HCT: 40.8 % (ref 36.0–46.0)
Hemoglobin: 13.1 g/dL (ref 12.0–15.0)
Lymphocytes Relative: 26.4 % (ref 12.0–46.0)
Lymphs Abs: 1.6 K/uL (ref 0.7–4.0)
MCHC: 32.1 g/dL (ref 30.0–36.0)
MCV: 85.8 fl (ref 78.0–100.0)
Monocytes Absolute: 0.5 K/uL (ref 0.1–1.0)
Monocytes Relative: 8 % (ref 3.0–12.0)
Neutro Abs: 3.8 K/uL (ref 1.4–7.7)
Neutrophils Relative %: 61.7 % (ref 43.0–77.0)
Platelets: 265 K/uL (ref 150.0–400.0)
RBC: 4.75 Mil/uL (ref 3.87–5.11)
RDW: 14.2 % (ref 11.5–15.5)
WBC: 6.2 K/uL (ref 4.0–10.5)

## 2024-05-05 LAB — POCT EXHALED NITRIC OXIDE: FeNO level (ppb): 19

## 2024-05-05 MED ORDER — SPIRIVA RESPIMAT 1.25 MCG/ACT IN AERS: 2.0000 | INHALATION_SPRAY | Freq: Every day | RESPIRATORY_TRACT | Status: DC

## 2024-05-05 MED ORDER — BUDESONIDE-FORMOTEROL FUMARATE 80-4.5 MCG/ACT IN AERO: 2.0000 | INHALATION_SPRAY | Freq: Two times a day (BID) | RESPIRATORY_TRACT | 11 refills | Status: AC

## 2024-05-05 MED ORDER — SPIRIVA RESPIMAT 1.25 MCG/ACT IN AERS: 2.0000 | INHALATION_SPRAY | Freq: Every day | RESPIRATORY_TRACT | 11 refills | Status: DC

## 2024-05-05 MED ORDER — INCRUSE ELLIPTA 62.5 MCG/ACT IN AEPB
1.0000 | INHALATION_SPRAY | Freq: Every day | RESPIRATORY_TRACT | 11 refills | Status: AC
Start: 2024-05-05 — End: ?

## 2024-05-05 MED ORDER — FLUTICASONE PROPIONATE 50 MCG/ACT NA SUSP: 2.0000 | Freq: Every day | NASAL | 2 refills | Status: DC

## 2024-05-05 NOTE — Progress Notes (Signed)
 @Patient  ID: Elizabeth Roman, female    DOB: 15-Jan-1951, 73 y.o.   MRN: [id number]  Chief Complaint  Patient presents with   Shortness of Breath    SOB with exertion x 6 months    Referring provider: Ransom Other, MD  HPI: 73 year old female, never smoker followed for chronic cough, asthma. She is a former pt of Dr. Gladis and last seen in office 11/24/2023 by Orlie, NP. Past medical history significant for CAD, HTN, OSA on CPAP, GERD, HLD, fibromyalgia.   TEST/EVENTS:  12/03/2023 HRCT chest: atherosclerosis. Subtle ground-glass and reticular densities in RML, lingula and b/l LL. Otherwise bibasilar scarring. Nonspecific. No ILD. Minimal air trapping. Possible cirrhotic liver.  05/05/2024 spiro: FVC 101, FEV1 93, ratio 70  05/05/2024: Today - acute Discussed the use of AI scribe software for clinical note transcription with the patient, who gave verbal consent to proceed.  History of Present Illness Elizabeth Roman is a 73 year old female with asthma who presents with chronic cough and nasal congestion.  She experiences a persistent cough and nasal congestion that have been worsening over the years, accompanied by constant throat clearing and voice hoarseness. She has no reflux symptoms. She reports postnasal drip and occasional use of a natural nasal spray, though not consistently. Her phlegm is mostly clear or white, occasionally yellow, with no seasonal variation in symptoms. No difficulties swallowing. No ear pain, sore throat or headaches. Does not take any allergy pills right now.   She has a history of asthma and uses Symbicort , taking one puff at night and two in the morning, noting that mornings are particularly difficult for her breathing. She is concerned about running out of medication if she uses it more. No wheezing or hemoptysis. No chest congestion. Does feel like she is more short winded over the last few months with exertion. Her weight has increased by ten pounds over the past few  months, which she attributes to situational stress and travel. She does note that when her weight increases, she tends to have more trouble moving around. She denies any leg swelling, CP, palpitations, dizziness.   She uses a CPAP machine for sleep apnea but reports difficulty breathing at night due to congestion, sometimes needing to remove the CPAP. She is due for follow up with her provider who manages her CPAP. No drowsy driving.   Her symptoms feel relatively stable compared to May, when she had HRCT chest. No evidence of RA ILD.       Allergies  Allergen Reactions   Morphine And Codeine Anaphylaxis   Prednisone Hives and Other (See Comments)   Atorvastatin Other (See Comments)   Atorvastatin Calcium Other (See Comments)   Codeine Other (See Comments)    Very ill.   Hydroxychloroquine Sulfate     Other reaction(s): no benefit   Ivp Dye [Iodinated Contrast Media] Hives   Phenobarbital Hives   Propoxyphene    Sulfa Antibiotics Hives   Vicodin [Hydrocodone-Acetaminophen] Other (See Comments)    Very ill.     There is no immunization history on file for this patient.  Past Medical History:  Diagnosis Date   Arthritis    High cholesterol    Hypertension    Left hip pain 09/2019   Multiple joint pain 03/2020   HANDICAP PLACARD    Sleep apnea    Thyroid  disease     Tobacco History: Social History   Tobacco Use  Smoking Status Never  Smokeless Tobacco Never  Counseling given: Not Answered   Outpatient Medications Prior to Visit  Medication Sig Dispense Refill   albuterol  (VENTOLIN  HFA) 108 (90 Base) MCG/ACT inhaler Inhale 2 puffs into the lungs every 6 (six) hours as needed for wheezing or shortness of breath. 8 g 3   amLODipine  (NORVASC ) 10 MG tablet TAKE 1 TABLET AT BEDTIME 90 tablet 2   Bromelain 250 MG CAPS Take by mouth daily.     budesonide -formoterol  (SYMBICORT ) 80-4.5 MCG/ACT inhaler Inhale 2 puffs into the lungs in the morning and at bedtime. (Patient  taking differently: Inhale 2 puffs into the lungs in the morning and at bedtime. 2 puffs in am and 1 puffs qhs) 1 each 3   Coenzyme Q10 (CO Q-10) 200 MG CAPS Take 400 mg by mouth at bedtime.     Cranberry 500 MG CAPS Take by mouth. 2 times per day     Evolocumab  (REPATHA  SURECLICK) 140 MG/ML SOAJ INJECT 140 MG INTO THE SKIN EVERY 14 (FOURTEEN) DAYS. 6 mL 1   ferrous sulfate 325 (65 FE) MG tablet daily.     levothyroxine (SYNTHROID, LEVOTHROID) 75 MCG tablet Take 75 mcg by mouth daily before breakfast.     losartan (COZAAR) 100 MG tablet 1 tablet Orally Once a day for 90 days     MAGNESIUM PO Take 400 mg by mouth daily.     Multiple Vitamins-Minerals (ANTIOXIDANT PO) Take by mouth. 3 cap fulls daily     OVER THE COUNTER MEDICATION Stool softener 250 mg at bedtime     OVER THE COUNTER MEDICATION Glucosamine 2000mg  daily. Patient stated taking in powder form     pantoprazole (PROTONIX) 40 MG tablet Take 40 mg by mouth daily as needed (indigestion).     Turmeric (CURCUMIN 95 PO) Take 1 tablet by mouth 2 (two) times daily.     VITAMIN B COMPLEX-C CAPS Take 2 capsules by mouth daily.     vitamin C (ASCORBIC ACID) 500 MG tablet Take 500 mg by mouth 2 (two) times daily.     VITAMIN D PO Take by mouth daily. Per patient taking 5,000 units taking every evening/     zinc gluconate 50 MG tablet Take 50 mg by mouth daily.     budesonide -formoterol  (SYMBICORT ) 80-4.5 MCG/ACT inhaler Inhale 1 puff into the lungs 2 (two) times daily. 10.2 g 5   Semaglutide -Weight Management (WEGOVY ) 1 MG/0.5ML SOAJ Inject 1 mg into the skin once a week. (Patient not taking: Reported on 12/29/2023) 2 mL 0   Semaglutide -Weight Management (WEGOVY ) 1 MG/0.5ML SOAJ Inject 1 mg into the skin once a week. (Patient not taking: Reported on 12/29/2023) 2 mL 0   No facility-administered medications prior to visit.     Review of Systems: as above    Physical Exam:  BP 136/84   Pulse 80   Temp 97.6 F (36.4 C) (Oral)   Ht 5' 3  (1.6 m)   Wt 215 lb (97.5 kg)   SpO2 97%   BMI 38.09 kg/m   GEN: Pleasant, interactive, well-appearing; obese; in no acute distress HEENT:  Normocephalic and atraumatic. EACs patent bilaterally. TM pearly gray with present light reflex bilaterally. PERRLA. Sclera white. Nasal turbinates erythematous, moist and patent bilaterally. No rhinorrhea present. Oropharynx pink and moist, without exudate or edema. No lesions, ulcerations, or postnasal drip. Hoarse voice quality  NECK:  Supple w/ fair ROM. No JVD present. Thyroid  symmetrical with no goiter or nodules palpated. No lymphadenopathy.   CV: RRR, no m/r/g, no  peripheral edema. Pulses intact, +2 bilaterally. No cyanosis, pallor or clubbing. PULMONARY:  Unlabored, regular breathing. Clear bilaterally A&P w/o wheezes/rales/rhonchi. No accessory muscle use.  GI: BS present and normoactive. Soft, non-tender to palpation.  MSK: No erythema, warmth or tenderness. Cap refil <2 sec all extrem.  Neuro: A/Ox3. No focal deficits noted.   Skin: Warm, no lesions or rashe Psych: Normal affect and behavior. Judgement and thought content appropriate.     Lab Results:  CBC    Component Value Date/Time   WBC 11.6 (H) 07/06/2020 1119   RBC 4.61 07/06/2020 1119   HGB 13.3 07/06/2020 1119   HCT 42.4 07/06/2020 1119   PLT 248 07/06/2020 1119   MCV 92.0 07/06/2020 1119   MCH 28.9 07/06/2020 1119   MCHC 31.4 07/06/2020 1119   RDW 13.3 07/06/2020 1119   LYMPHSABS 1.5 09/24/2018 2057   MONOABS 0.7 09/24/2018 2057   EOSABS 0.6 (H) 09/24/2018 2057   BASOSABS 0.0 09/24/2018 2057    BMET    Component Value Date/Time   NA 138 11/26/2021 1354   K 4.9 11/26/2021 1354   CL 104 11/26/2021 1354   CO2 20 11/26/2021 1354   GLUCOSE 100 (H) 11/26/2021 1354   GLUCOSE 61 (L) 07/06/2020 1119   BUN 29 (H) 11/26/2021 1354   CREATININE 1.03 (H) 11/26/2021 1354   CALCIUM 9.8 11/26/2021 1354   GFRNONAA >60 07/06/2020 1119   GFRAA >60 09/24/2018 2057    BNP No  results found for: BNP   Imaging:  No results found.  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 Symptoms include morning shortness of breath and nocturnal dyspnea, possibly exacerbated by sinusitis. Lung exam clear. Exhaled nitric oxide testing normal, indicating lack of airway inflammation. Will have her escalate her inhaler therapy to Symbicort  2 puffs Twice daily and add on LAMA therapy. Reassess response. Target sinus symptoms aggressively and check allergen panel. Referral to ENT if symptoms fail to improve. Suspect weight gain is also a contributing factor. Healthy weight loss and focus on conditioning encouraged. Prior HRCT chest without evidence of ILD. Walk test without exertional hypoxia. Action plan in place.  - Increase Symbicort  to two puffs in the morning and two puffs in the evening. - Prescribe Incruse, 1 puffs in the morning with Symbicort . Unable to utilize respimat device due to RA - Side effect profiles reviewed  - Evaluate response to inhalers to determine if symptoms are lung-related.  Chronic nasal congestion and postnasal drainage Chronic nasal congestion and postnasal drainage with associated voice hoarseness and throat  clearing. Possible causes include allergies or silent  reflux. No significant seasonal variation noted. Clear to white phlegm, occasionally yellow.  - Prescribe Flonase nasal spray, two sprays each nostril twice daily for four weeks. - Order allergen panel to assess for allergy triggers. - Check peripheral eosinophil count for environmental triggers. - Recommend over-the-counter Mucinex to thin mucus. - Continue GERD regimen  - Consider referral to ENT if hoarseness and drainage persist.  Ground glass opacities in lungs, nonspecific Nonspecific ground glass opacities noted in the lower lobes and middle area of the left lobe. No evidence of interstitial lung disease related to rheumatoid arthritis. No significant change in lung function to warrant repeat CT imaging at this time. Symptoms stable since prior CT.   Abnormal weight gain Recent weight gain of 10 pounds over two months, possibly contributing to shortness of breath. Weight gain occurred during a period of increased stress and travel. - Advise on weight management to alleviate shortness of breath.  DOE See above. No obvious cardiac cause. Can consider cardiopulmonary exercise testing if symptoms fail to improve with traditional asthma management and sinusitis tx.     Advised if symptoms do not improve or worsen, to please contact office for sooner follow up or seek emergency care.   I spent 45 minutes of dedicated to the care of this patient on the date of this encounter to include pre-visit review of records, face-to-face time with the patient discussing conditions above, post visit ordering of testing, clinical documentation with the electronic health record, making appropriate referrals as documented, and communicating necessary findings to members of the patients care team.  Comer LULLA Rouleau, NP 05/05/2024  Pt aware and understands NP's role.

## 2024-05-05 NOTE — Patient Instructions (Addendum)
 Continue Albuterol  inhaler 2 puffs or 3 mL neb every 6 hours as needed for shortness of breath or wheezing. Notify if symptoms persist despite rescue inhaler/neb use. Continue symbicort  2 puffs in morning and increase to 2 puffs in the pm. Brush tongue and rinse mouth afterwards Continue pantoprazole 1 tab daily   -Trial Incruse 1 puffs daily in morning. Monitor breathing with this addition. Notify if you develop any urinary retention or vision changes  -Flonase nasal spray 2 sprays each nostril daily for nasal congestion/drainage -Use guaifenesin (Mucinex) [phone] mg Twice daily as needed for congestion   Labs today with allergy testing   May refer you to ENT if your voice hoarseness and throat clearing persists with the above treatments   Follow up in 4-5 weeks with any new pulmonologist (30 min slot to establish care) or Elizabeth Ramey [place],NP. If symptoms do not improve or worsen, please contact office for sooner follow up or seek emergency care.

## 2024-05-06 DIAGNOSIS — I10 Essential (primary) hypertension: Secondary | ICD-10-CM | POA: Diagnosis not present

## 2024-05-06 DIAGNOSIS — Z6837 Body mass index (BMI) 37.0-37.9, adult: Secondary | ICD-10-CM | POA: Diagnosis not present

## 2024-05-06 DIAGNOSIS — I700 Atherosclerosis of aorta: Secondary | ICD-10-CM | POA: Diagnosis not present

## 2024-05-06 DIAGNOSIS — R7303 Prediabetes: Secondary | ICD-10-CM | POA: Diagnosis not present

## 2024-05-06 DIAGNOSIS — E669 Obesity, unspecified: Secondary | ICD-10-CM | POA: Diagnosis not present

## 2024-05-07 ENCOUNTER — Emergency Department (HOSPITAL_COMMUNITY)

## 2024-05-07 ENCOUNTER — Emergency Department (HOSPITAL_COMMUNITY)
Admission: EM | Admit: 2024-05-07 | Discharge: 2024-05-07 | Disposition: A | Discharge status: Home / Self Care | Attending: Emergency Medicine | Admitting: Emergency Medicine

## 2024-05-07 DIAGNOSIS — I2510 Atherosclerotic heart disease of native coronary artery without angina pectoris: Secondary | ICD-10-CM | POA: Diagnosis not present

## 2024-05-07 DIAGNOSIS — R1012 Left upper quadrant pain: Secondary | ICD-10-CM | POA: Insufficient documentation

## 2024-05-07 DIAGNOSIS — R21 Rash and other nonspecific skin eruption: Secondary | ICD-10-CM | POA: Diagnosis present

## 2024-05-07 DIAGNOSIS — R110 Nausea: Secondary | ICD-10-CM | POA: Diagnosis not present

## 2024-05-07 DIAGNOSIS — R0789 Other chest pain: Secondary | ICD-10-CM | POA: Diagnosis not present

## 2024-05-07 DIAGNOSIS — I771 Stricture of artery: Secondary | ICD-10-CM | POA: Diagnosis not present

## 2024-05-07 DIAGNOSIS — R0602 Shortness of breath: Secondary | ICD-10-CM | POA: Diagnosis not present

## 2024-05-07 DIAGNOSIS — R0600 Dyspnea, unspecified: Secondary | ICD-10-CM | POA: Diagnosis not present

## 2024-05-07 DIAGNOSIS — R079 Chest pain, unspecified: Secondary | ICD-10-CM | POA: Insufficient documentation

## 2024-05-07 DIAGNOSIS — B029 Zoster without complications: Secondary | ICD-10-CM | POA: Diagnosis not present

## 2024-05-07 DIAGNOSIS — M549 Dorsalgia, unspecified: Secondary | ICD-10-CM | POA: Insufficient documentation

## 2024-05-07 DIAGNOSIS — R059 Cough, unspecified: Secondary | ICD-10-CM | POA: Diagnosis not present

## 2024-05-07 DIAGNOSIS — R0781 Pleurodynia: Secondary | ICD-10-CM | POA: Diagnosis not present

## 2024-05-07 DIAGNOSIS — K760 Fatty (change of) liver, not elsewhere classified: Secondary | ICD-10-CM | POA: Diagnosis not present

## 2024-05-07 DIAGNOSIS — J9811 Atelectasis: Secondary | ICD-10-CM | POA: Diagnosis not present

## 2024-05-07 DIAGNOSIS — K5730 Diverticulosis of large intestine without perforation or abscess without bleeding: Secondary | ICD-10-CM | POA: Diagnosis not present

## 2024-05-07 DIAGNOSIS — J849 Interstitial pulmonary disease, unspecified: Secondary | ICD-10-CM | POA: Diagnosis not present

## 2024-05-07 DIAGNOSIS — K449 Diaphragmatic hernia without obstruction or gangrene: Secondary | ICD-10-CM | POA: Diagnosis not present

## 2024-05-07 LAB — URINALYSIS, ROUTINE W REFLEX MICROSCOPIC
Bilirubin Urine: NEGATIVE
Glucose, UA: NEGATIVE mg/dL
Hgb urine dipstick: NEGATIVE
Ketones, ur: NEGATIVE mg/dL
Nitrite: NEGATIVE
Protein, ur: NEGATIVE mg/dL
Specific Gravity, Urine: 1.008 (ref 1.005–1.030)
pH: 7 (ref 5.0–8.0)

## 2024-05-07 LAB — COMPREHENSIVE METABOLIC PANEL WITH GFR
ALT: 19 U/L (ref 0–44)
AST: 26 U/L (ref 15–41)
Albumin: 4.7 g/dL (ref 3.5–5.0)
Alkaline Phosphatase: 85 U/L (ref 38–126)
Anion gap: 12 (ref 5–15)
BUN: 35 mg/dL — ABNORMAL HIGH (ref 8–23)
CO2: 23 mmol/L (ref 22–32)
Calcium: 10.6 mg/dL — ABNORMAL HIGH (ref 8.9–10.3)
Chloride: 103 mmol/L (ref 98–111)
Creatinine, Ser: 1.09 mg/dL — ABNORMAL HIGH (ref 0.44–1.00)
GFR, Estimated: 53 mL/min — ABNORMAL LOW (ref >60)
Glucose, Bld: 109 mg/dL — ABNORMAL HIGH (ref 70–99)
Potassium: 4.8 mmol/L (ref 3.5–5.1)
Sodium: 139 mmol/L (ref 135–145)
Total Bilirubin: 0.5 mg/dL (ref 0.0–1.2)
Total Protein: 7.9 g/dL (ref 6.5–8.1)

## 2024-05-07 LAB — CBC WITH DIFFERENTIAL/PLATELET
Abs Immature Granulocytes: 0.01 K/uL (ref 0.00–0.07)
Basophils Absolute: 0 K/uL (ref 0.0–0.1)
Basophils Relative: 1 %
Eosinophils Absolute: 0.2 K/uL (ref 0.0–0.5)
Eosinophils Relative: 3 %
HCT: 41.1 % (ref 36.0–46.0)
Hemoglobin: 12.6 g/dL (ref 12.0–15.0)
Immature Granulocytes: 0 %
Lymphocytes Relative: 21 %
Lymphs Abs: 1.3 K/uL (ref 0.7–4.0)
MCH: 27.2 pg (ref 26.0–34.0)
MCHC: 30.7 g/dL (ref 30.0–36.0)
MCV: 88.6 fL (ref 80.0–100.0)
Monocytes Absolute: 0.5 K/uL (ref 0.1–1.0)
Monocytes Relative: 9 %
Neutro Abs: 4.2 K/uL (ref 1.7–7.7)
Neutrophils Relative %: 66 %
Platelets: 228 K/uL (ref 150–400)
RBC: 4.64 MIL/uL (ref 3.87–5.11)
RDW: 13.9 % (ref 11.5–15.5)
Smear Review: NORMAL
WBC: 6.3 K/uL (ref 4.0–10.5)
nRBC: 0 % (ref 0.0–0.2)

## 2024-05-07 LAB — TROPONIN T, HIGH SENSITIVITY
Troponin T High Sensitivity: 18 ng/L (ref 0–19)
Troponin T High Sensitivity: 16 ng/L (ref 0–19)

## 2024-05-07 LAB — LIPASE, BLOOD: Lipase: 27 U/L (ref 11–51)

## 2024-05-07 MED ORDER — METHYLPREDNISOLONE SODIUM SUCC 40 MG IJ SOLR
40.0000 mg | Freq: Once | INTRAMUSCULAR | Status: AC
  Administered 2024-05-07: 40 mg via INTRAVENOUS
  Filled 2024-05-07: qty 1

## 2024-05-07 MED ORDER — NAPROXEN 250 MG PO TABS
375.0000 mg | ORAL_TABLET | Freq: Once | ORAL | Status: DC
  Filled 2024-05-07: qty 2

## 2024-05-07 MED ORDER — VALACYCLOVIR HCL 500 MG PO TABS
1000.0000 mg | ORAL_TABLET | Freq: Once | ORAL | Status: AC
  Administered 2024-05-07: 1000 mg via ORAL
  Filled 2024-05-07: qty 2

## 2024-05-07 MED ORDER — TRAMADOL HCL 50 MG PO TABS
50.0000 mg | ORAL_TABLET | Freq: Once | ORAL | Status: DC
  Filled 2024-05-07: qty 1

## 2024-05-07 MED ORDER — VALACYCLOVIR HCL 1 G PO TABS: 1000.0000 mg | ORAL_TABLET | Freq: Three times a day (TID) | ORAL | 0 refills | Status: DC

## 2024-05-07 MED ORDER — NAPROXEN 375 MG PO TABS: 375.0000 mg | ORAL_TABLET | Freq: Two times a day (BID) | ORAL | 0 refills | Status: AC | PRN

## 2024-05-07 MED ORDER — DIPHENHYDRAMINE HCL 25 MG PO CAPS: 50.0000 mg | ORAL_CAPSULE | Freq: Once | ORAL | Status: AC

## 2024-05-07 MED ORDER — IOHEXOL 350 MG/ML SOLN
100.0000 mL | Freq: Once | INTRAVENOUS | Status: AC | PRN
  Administered 2024-05-07: 100 mL via INTRAVENOUS

## 2024-05-07 MED ORDER — LACTATED RINGERS IV BOLUS
500.0000 mL | Freq: Once | INTRAVENOUS | Status: AC
Start: 2024-05-07 — End: 2024-05-07
  Administered 2024-05-07: 500 mL via INTRAVENOUS

## 2024-05-07 MED ORDER — DIPHENHYDRAMINE HCL 50 MG/ML IJ SOLN
50.0000 mg | Freq: Once | INTRAMUSCULAR | Status: AC
  Administered 2024-05-07: 50 mg via INTRAVENOUS
  Filled 2024-05-07: qty 1

## 2024-05-07 NOTE — ED Triage Notes (Addendum)
 C/o left rib pain shooting to her left flank causing difficulties taking a deep breath. Pain started last night and has been persistent. Has not taken any OTC meds. Denies any urinary issues. Denies any recent falls. Chronic cough. Seen pulmonologist yesterday for prolonged SOB and is following up with a cardiologist.

## 2024-05-07 NOTE — ED Notes (Signed)
 Writer placed pt on oxygen at 2 liters due to pt's oxygen saturation consistently dropping into the high 80s.

## 2024-05-07 NOTE — ED Notes (Signed)
 Pt was ambulatory to the restroom with signs of pain

## 2024-05-07 NOTE — ED Notes (Signed)
 Pt ambulatory to the restroom stated she felt a little woozy Rn made aware

## 2024-05-07 NOTE — Discharge Instructions (Addendum)
 Your pain is coming from shingles.  This is contagious and you need to cover up any active lesions.  You are especially contagious and need to avoid people with compromised immune systems, such as the elderly, pregnant women, etc.  Follow-up with your primary care provider.  We are prescribing you an NSAID for pain called naproxen .  Do not take other NSAIDs such as ibuprofen , Advil , Aleve , etc.  You may still take Tylenol.  We are also placing you on an antiviral medicine called valacyclovir.  You were given the first dose in the emergency department, you can take your next dose this evening.  Return to the ER for any new or worsening symptoms.

## 2024-05-07 NOTE — ED Notes (Addendum)
 Writer recollected light green tube and sent to lab.

## 2024-05-07 NOTE — ED Notes (Signed)
 Gave pt saltine crackers, MD said it was ok RN made aware

## 2024-05-07 NOTE — ED Provider Notes (Signed)
 [name] Provider Note   CSN: [id number] Arrival date & time: 05/07/24  9243     Patient presents with: Shortness of Breath   Elizabeth Roman is a 73 y.o. female.   HPI 73 year old female presents with acute left upper abdominal and left back pain.  Patient states the pain started last night and is now severe.  It is sharp and seems to go straight through.  No chest pain.  She has chronic dyspnea that is unchanged.  The pain is certainly worse with inspiration.  No new cough.  No vomiting but has had some nausea this morning, though she states this is not that atypical for her.  She denies any diarrhea or concern for UTI.  Prior to Admission medications   Medication Sig Start Date End Date Taking? Authorizing Provider  naproxen  (NAPROSYN ) 375 MG tablet Take 1 tablet (375 mg total) by mouth 2 (two) times daily as needed. 05/07/24  Yes Freddi Hamilton, MD  valACYclovir (VALTREX) 1000 MG tablet Take 1 tablet (1,000 mg total) by mouth 3 (three) times daily. 05/07/24  Yes Freddi Hamilton, MD  albuterol  (VENTOLIN  HFA) 108 [phone] Base) MCG/ACT inhaler Inhale 2 puffs into the lungs every 6 (six) hours as needed for wheezing or shortness of breath. 10/10/22   Hobart Powell BRAVO, MD  amLODipine  (NORVASC ) 10 MG tablet TAKE 1 TABLET AT BEDTIME 02/23/24   Lonni Slain, MD  Bromelain 250 MG CAPS Take by mouth daily.    [provider]  budesonide -formoterol  (SYMBICORT ) 80-4.5 MCG/ACT inhaler Inhale 2 puffs into the lungs in the morning and at bedtime. 05/05/24   Cobb, Comer GAILS, NP  Coenzyme Q10 (CO Q-10) 200 MG CAPS Take 400 mg by mouth at bedtime.    [provider]  Cranberry 500 MG CAPS Take by mouth. 2 times per day    [provider]  Evolocumab  (REPATHA  SURECLICK) 140 MG/ML SOAJ INJECT 140 MG INTO THE SKIN EVERY 14 (FOURTEEN) DAYS. 04/26/24   Walker, Caitlin S, NP  ferrous sulfate 325 (65 FE) MG tablet daily. 09/21/19    [provider]  fluticasone (FLONASE) 50 MCG/ACT nasal spray Place 2 sprays into both nostrils daily. 05/05/24   Cobb, Comer GAILS, NP  levothyroxine (SYNTHROID, LEVOTHROID) 75 MCG tablet Take 75 mcg by mouth daily before breakfast.    [provider]  losartan (COZAAR) 100 MG tablet 1 tablet Orally Once a day for 90 days 01/06/23   [provider]  MAGNESIUM PO Take 400 mg by mouth daily.    [provider]  Multiple Vitamins-Minerals (ANTIOXIDANT PO) Take by mouth. 3 cap fulls daily    [provider]  OVER THE COUNTER MEDICATION Stool softener 250 mg at bedtime    [provider]  OVER THE COUNTER MEDICATION Glucosamine 2000mg  daily. Patient stated taking in powder form    [provider]  pantoprazole (PROTONIX) 40 MG tablet Take 40 mg by mouth daily as needed (indigestion).    [provider]  Turmeric (CURCUMIN 95 PO) Take 1 tablet by mouth 2 (two) times daily.    [provider]  umeclidinium bromide (INCRUSE ELLIPTA) 62.5 MCG/ACT AEPB Inhale 1 puff into the lungs daily. 05/05/24   Cobb, Comer GAILS, NP  VITAMIN B COMPLEX-C CAPS Take 2 capsules by mouth daily.    [provider]  vitamin C (ASCORBIC ACID) 500 MG tablet Take 500 mg by mouth 2 (two) times daily.    [provider]  VITAMIN D PO Take by mouth daily. Per patient taking 5,000 units taking every evening/    [provider]  zinc gluconate 50 MG tablet Take 50 mg by mouth daily.    [provider]    Allergies: Morphine and codeine, Prednisone, Atorvastatin, Atorvastatin calcium, Codeine, Hydroxychloroquine sulfate, Ivp dye [iodinated contrast media], Phenobarbital, Propoxyphene, Sulfa antibiotics, and Vicodin [hydrocodone-acetaminophen]    Review of Systems  Constitutional:  Negative for fever.  Respiratory:  Negative for cough.   Cardiovascular:  Negative for chest pain.  Gastrointestinal:  Positive for  abdominal pain and nausea. Negative for vomiting.  Genitourinary:  Negative for dysuria.  Musculoskeletal:  Positive for back pain.    Updated Vital Signs BP (!) 145/86   Pulse 75   Temp 98.3 F (36.8 C) (Oral)   Resp 18   SpO2 94%   Physical Exam Vitals and nursing note reviewed.  Constitutional:      Appearance: She is well-developed. She is not diaphoretic.  HENT:     Head: Normocephalic and atraumatic.  Cardiovascular:     Rate and Rhythm: Normal rate and regular rhythm.     Heart sounds: Normal heart sounds.  Pulmonary:     Effort: Pulmonary effort is normal.     Breath sounds: Normal breath sounds.  Chest:    Abdominal:     Palpations: Abdomen is soft.     Tenderness: There is abdominal tenderness in the left upper quadrant.  Musculoskeletal:       Back:  Skin:    General: Skin is warm and dry.     Findings: No rash (specifically no rash on trunk).  Neurological:     Mental Status: She is alert.     (all labs ordered are listed, but only abnormal results are displayed) Labs Reviewed  COMPREHENSIVE METABOLIC PANEL WITH GFR - Abnormal; Notable for the following components:      Result Value   Glucose, Bld 109 (*)    BUN 35 (*)    Creatinine, Ser 1.09 (*)    Calcium 10.6 (*)    GFR, Estimated 53 (*)    All other components within normal limits  URINALYSIS, ROUTINE W REFLEX MICROSCOPIC - Abnormal; Notable for the following components:   Leukocytes,Ua TRACE (*)    Bacteria, UA RARE (*)    All other components within normal limits  LIPASE, BLOOD  CBC WITH DIFFERENTIAL/PLATELET  TROPONIN T, HIGH SENSITIVITY  TROPONIN T, HIGH SENSITIVITY    EKG: EKG Interpretation Date/Time:  Friday May 07 2024 08:21:07 EDT Ventricular Rate:  79 PR Interval:  145 QRS Duration:  92 QT Interval:  373 QTC Calculation: 428 R Axis:   75  Text Interpretation: Sinus rhythm no acute ST/T changes no significant change since Mar 2025 Confirmed by Freddi Hamilton [phone])  on 05/07/2024 8:24:29 AM  Radiology: CT Angio Chest PE W and/or Wo Contrast Result Date: 05/07/2024 EXAM: CTA of the Chest with contrast for PE 05/07/2024 12:37:34 PM TECHNIQUE: CTA of the chest was performed after the administration of 100 mL of iohexol (OMNIPAQUE) 350 MG/ML injection. Multiplanar reformatted images are provided for review. MIP images are provided for review. Automated exposure control, iterative reconstruction, and/or weight based adjustment of the mA/kV was utilized to reduce the radiation dose to as low as reasonably achievable. COMPARISON: 12/03/2023 CLINICAL HISTORY: Pulmonary embolism (PE) suspected, high prob. C/o left rib pain shooting to her left flank causing difficulties taking a deep breath.; Pain started last night  and has been persistent. Seen pulmonologist yesterday for prolonged SOB and is following up with a cardiologist.; Pt received emergent 4 hour prep for CT Contrast. FINDINGS: PULMONARY ARTERIES: Pulmonary arteries are adequately opacified for evaluation. No pulmonary embolism. Main pulmonary artery is normal in caliber. MEDIASTINUM: Coronary artery calcifications are noted. Small sliding-type hiatal hernia. Atherosclerosis of thoracic aorta. The pericardium demonstrates no acute abnormality. LYMPH NODES: No mediastinal, hilar or axillary lymphadenopathy. LUNGS AND PLEURA: Minimal bibasilar subselectasis. No pleural effusion or pneumothorax. UPPER ABDOMEN: Limited images of the upper abdomen are unremarkable. SOFT TISSUES AND BONES: No acute bone or soft tissue abnormality. IMPRESSION: 1. No pulmonary embolism. 2. Atherosclerosis of the thoracic aorta. 3. Coronary artery calcifications. 4. Small sliding-type hiatal hernia. 5. Minimal bibasilar subsegmental atelectasis. Electronically signed by: Lynwood Seip MD 05/07/2024 01:14 PM EDT RP Workstation: [id number]   CT ABDOMEN PELVIS W CONTRAST Result Date: 05/07/2024 CLINICAL DATA:  Left upper quadrant abdominal pain.  EXAM: CT ABDOMEN AND PELVIS WITH CONTRAST TECHNIQUE: Multidetector CT imaging of the abdomen and pelvis was performed using the standard protocol following bolus administration of intravenous contrast. RADIATION DOSE REDUCTION: This exam was performed according to the departmental dose-optimization program which includes automated exposure control, adjustment of the mA and/or kV according to patient size and/or use of iterative reconstruction technique. CONTRAST:  OMNIPAQUE IOHEXOL 350 MG/ML SOLN COMPARISON:  May 07, 2024 (9:05 a.m.) FINDINGS: Lower chest: Mild atelectatic changes are seen within the bilateral lung bases. Hepatobiliary: There is diffuse fatty infiltration of the liver parenchyma. No focal liver abnormality is seen. No gallstones, gallbladder wall thickening, or biliary dilatation. Pancreas: Unremarkable. No pancreatic ductal dilatation or surrounding inflammatory changes. Spleen: Normal in size without focal abnormality. Adrenals/Urinary Tract: Adrenal glands are unremarkable. Kidneys are normal in size, without renal calculi or focal lesions. Mildly prominent renal pelvis are seen, bilaterally. The urinary bladder is moderately distended and is otherwise unremarkable. Stomach/Bowel: There is a small hiatal hernia. Appendix appears normal. No evidence of bowel wall thickening, distention, or inflammatory changes. Noninflamed diverticula are seen throughout the large bowel. Vascular/Lymphatic: Aortic atherosclerosis. No enlarged abdominal or pelvic lymph nodes. Reproductive: Status post hysterectomy. No adnexal masses. Other: No abdominal wall hernia or abnormality. No abdominopelvic ascites. Musculoskeletal: Multilevel degenerative changes are present throughout the lumbar spine with grade 1 anterolisthesis of the L4 vertebral body on L5. IMPRESSION: 1. Hepatic steatosis. 2. Small hiatal hernia. 3. Colonic diverticulosis. 4. Aortic atherosclerosis. Electronically Signed   By: Suzen Dials M.D.   On: 05/07/2024 13:14   CT ABDOMEN PELVIS WO CONTRAST Result Date: 05/07/2024 EXAM: CT ABDOMEN AND PELVIS WITHOUT CONTRAST 05/07/2024 09:19:05 AM TECHNIQUE: CT of the abdomen and pelvis was performed without the administration of intravenous contrast. Multiplanar reformatted images are provided for review. Automated exposure control, iterative reconstruction, and/or weight-based adjustment of the mA/kV was utilized to reduce the radiation dose to as low as reasonably achievable. COMPARISON: 05/17/2021 CLINICAL HISTORY: LUQ abdominal pain, left rib pain shooting to left flank, difficulties taking deep breath, persistent pain, chronic cough, prolonged SOB. FINDINGS: LOWER CHEST: Scattered coronary calcifications. LIVER: The liver is unremarkable. GALLBLADDER AND BILE DUCTS: Gallbladder is unremarkable. No biliary ductal dilatation. SPLEEN: No acute abnormality. PANCREAS: No acute abnormality. ADRENAL GLANDS: No acute abnormality. KIDNEYS, URETERS AND BLADDER: No stones in the kidneys or ureters. No hydronephrosis. No perinephric or periureteral stranding. Urinary bladder is unremarkable. GI AND BOWEL: Small hiatal hernia. Stomach demonstrates no acute abnormality. Normal appendix. A few scattered colonic diverticula without adjacent  inflammatory/edematous change. There is no bowel obstruction. PERITONEUM AND RETROPERITONEUM: No ascites. No free air. VASCULATURE: Aorta is normal in caliber. Moderate calcified aortoiliac atheromatous plaque without aneurysm. LYMPH NODES: No lymphadenopathy. REPRODUCTIVE ORGANS: No acute abnormality. BONES AND SOFT TISSUES: Osteitis pubis. Grade 1 anterolisthesis of L4-5 likely secondary to facet degenerative joint disease. No focal soft tissue abnormality. IMPRESSION: 1. No acute abnormality in the abdomen or pelvis. 2. Scattered coronary artery calcifications and moderate calcified aortoiliac atherosclerosis without aneurysm. 3. Small hiatal hernia. 4. Colonic  diverticulosis without evidence of diverticulitis. Electronically signed by: Katheleen Faes MD 05/07/2024 09:33 AM EDT RP Workstation: [id number]   DG Chest Portable 1 View Result Date: 05/07/2024 CLINICAL DATA:  Left rib pain radiating to the left flank. Chronic cough. Prolonged shortness of breath. EXAM: PORTABLE CHEST 1 VIEW COMPARISON:  03/20/2023 FINDINGS: Normal sized heart. Stable mild chronic interstitial prominence and minimal bibasilar scarring. Tortuous and partially calcified thoracic aorta. Mild-to-moderate right glenohumeral degenerative changes. IMPRESSION: 1. No acute abnormality. 2. Stable mild chronic interstitial lung disease. Electronically Signed   By: Elspeth Bathe M.D.   On: 05/07/2024 08:59     Procedures   Medications Ordered in the ED  traMADol  (ULTRAM ) tablet 50 mg (0 mg Oral Hold 05/07/24 0923)  naproxen  (NAPROSYN ) tablet 375 mg (375 mg Oral Not Given 05/07/24 1410)  lactated ringers bolus 500 mL (0 mLs Intravenous Stopped 05/07/24 1327)  methylPREDNISolone  sodium succinate (SOLU-MEDROL ) 40 mg/mL injection 40 mg (40 mg Intravenous Given 05/07/24 0901)  diphenhydrAMINE (BENADRYL) capsule 50 mg ( Oral See Alternative 05/07/24 1020)    Or  diphenhydrAMINE (BENADRYL) injection 50 mg (50 mg Intravenous Given 05/07/24 1020)  iohexol (OMNIPAQUE) 350 MG/ML injection 100 mL (100 mLs Intravenous Contrast Given 05/07/24 1230)  valACYclovir (VALTREX) tablet 1,000 mg (1,000 mg Oral Given 05/07/24 1410)    Clinical Course as of 05/07/24 1442  Fri May 07, 2024  0836 Discussed CT with contrast with patient.  She has an allergy listed to IV dye in the chart, states she has not received any since she was around 18.  She states that she had hives to her knowledge.  She feels like she does not want to wait because she is trying to get out to go to a wedding for her daughter today.  Discussed that the ideal treatment would be pretreatment for multiple hours as per protocol.  However she  wants to skip the pretreatment entirely.  I did discuss with radiology, Dr.  Ramond.  Cannot override the pretreatment as it is still a potential threat to her and potentially could be life-threatening.  She understands this, and after discussion, we decided to do a CT without contrast of her abdomen and pelvis given this is located at her inferior ribs and upper abdomen.  If this shows us  an obvious cause such as pancreatitis or ureteral stone then we do not have to pursue imaging with contrast.  If negative she will need the pretreatment and IV contrast. [SG]    Clinical Course User Index [SG] Freddi Hamilton, MD                                 Medical Decision Making Amount and/or Complexity of Data Reviewed External Data Reviewed: notes. Labs: ordered.    Details: Troponins normal x 2 Radiology: ordered and independent interpretation performed.    Details: No PE ECG/medicine tests: ordered and independent interpretation performed.  Details: No ischemia  Risk Prescription drug management.   Patient was worked up for her acute lower chest/upper abdominal pain as above.  After workup, there is no clear cause on the CTs, labs, etc.  However on reevaluation I discussed that she may have a early varicella or zoster/shingles without a rash.  On reexamination in her left upper abdomen she now shows me that she has developed a rash and it does appear that she has a collection of blisters.  I suspect this is all zoster.  She has declined pain meds until she did ask for some tramadol .  She transiently needed some oxygen after this but now that has resolved.  She is requesting some naproxen  to help.  Will also put on some valacyclovir and her creatinine clearance is 69 so she should be able to tolerate a full dose.  Otherwise, she feels good no for discharge, we discussed something such as gabapentin but she declines.  Will have her follow-up with her PCP.     Final diagnoses:  Herpes zoster  without complication    ED Discharge Orders          Ordered    naproxen  (NAPROSYN ) 375 MG tablet  2 times daily PRN        05/07/24 1343    valACYclovir (VALTREX) 1000 MG tablet  3 times daily        05/07/24 1343               Freddi Hamilton, MD 05/07/24 1513

## 2024-05-08 LAB — ALLERGEN PANEL (27) + IGE
Alternaria Alternata IgE: 0.66 kU/L — AB
Aspergillus Fumigatus IgE: 1.37 kU/L — AB
Bahia Grass IgE: 0.48 kU/L — AB
Bermuda Grass IgE: 0.45 kU/L — AB
Cat Dander IgE: 2.93 kU/L — AB
Cedar, Mountain IgE: 0.19 kU/L — AB
Cladosporium Herbarum IgE: 0.37 kU/L — AB
Cocklebur IgE: 0.81 kU/L — AB
Cockroach, American IgE: 0.26 kU/L — AB
Common Silver Birch IgE: 0.14 kU/L — AB
D Farinae IgE: 38.3 kU/L — AB
D Pteronyssinus IgE: 38.9 kU/L — AB
Dog Dander IgE: 0.37 kU/L — AB
Elm, American IgE: 0.31 kU/L — AB
Hickory, White IgE: <0.1 kU/L
IgE (Immunoglobulin E), Serum: 4166 [IU]/mL — ABNORMAL HIGH (ref 6–495)
Johnson Grass IgE: 0.5 kU/L — AB
Kentucky Bluegrass IgE: 0.7 kU/L — AB
Maple/Box Elder IgE: 0.27 kU/L — AB
Mucor Racemosus IgE: 0.26 kU/L — AB
Oak, White IgE: 0.24 kU/L — AB
Penicillium Chrysogen IgE: 0.75 kU/L — AB
Pigweed, Rough IgE: 0.16 kU/L — AB
Plantain, English IgE: 0.14 kU/L — AB
Ragweed, Short IgE: 1.65 kU/L — AB
Setomelanomma Rostrat: 0.26 kU/L — AB
Timothy Grass IgE: 0.51 kU/L — AB
White Mulberry IgE: 0.13 kU/L — AB

## 2024-05-10 ENCOUNTER — Ambulatory Visit: Payer: Self-pay | Admitting: Nurse Practitioner

## 2024-05-10 ENCOUNTER — Telehealth: Payer: Self-pay | Admitting: Cardiology

## 2024-05-10 DIAGNOSIS — J309 Allergic rhinitis, unspecified: Secondary | ICD-10-CM

## 2024-05-10 NOTE — Progress Notes (Signed)
 ATCx1 LVMTCB - allergy referral placed

## 2024-05-10 NOTE — Telephone Encounter (Signed)
 Pt requesting to switch to Dr. Kriste, is this switch okay?

## 2024-05-10 NOTE — Progress Notes (Signed)
 Significant environmental allergies to dust, dogs, cats, grasses, trees, cockroaches, ragweed as well as other weeds common in our area. Please place referral to allergist. Thanks!

## 2024-05-12 ENCOUNTER — Ambulatory Visit: Payer: Self-pay

## 2024-05-12 ENCOUNTER — Encounter (HOSPITAL_BASED_OUTPATIENT_CLINIC_OR_DEPARTMENT_OTHER): Payer: Self-pay

## 2024-05-12 ENCOUNTER — Telehealth: Payer: Self-pay

## 2024-05-12 NOTE — Telephone Encounter (Signed)
 Copied from CRM #[id number]. Topic: Clinical - Lab/Test Results >> May 12, 2024  9:26 AM Benton KIDD wrote: Reason for CRM: patient returning call she received from [facility] heater concerning some results Called cal and ms debbie said they are with patients and it will probably be aroound 11:00 when she is available  Atc x1. Lmtcb. Also a duplicate message.

## 2024-05-12 NOTE — Telephone Encounter (Signed)
 ATC X2. LMTCB

## 2024-05-12 NOTE — Progress Notes (Signed)
 ATCx2, LVMTCB. Sending MyChart Message, NFN

## 2024-05-12 NOTE — Telephone Encounter (Signed)
 FYI Only or Action Required?: FYI only for provider.  Patient was last seen in primary care on unk.  Called Nurse Triage reporting Results.  Symptoms began several weeks ago.  Interventions attempted: Nothing.  Symptoms are: stable.  Triage Disposition: No disposition on file.  Patient/caregiver understands and will follow disposition?:   Copied from CRM [id number]. Topic: Clinical - Lab/Test Results >> May 12, 2024  2:15 PM Elizabeth Roman wrote: Reason for CRM: pt returning call about lab results. CMA called X2 Reason for Disposition  Caller requesting lab results  (Exception: Routine or non-urgent lab result.)  Answer Assessment - Initial Assessment Questions 1. REASON FOR CALL or QUESTION: What is your reason for calling today? or How can I best     Patient called to f/u on lab results  2. CALLER: Document the source of call. (e.g., laboratory staff, caregiver or patient).     Patient  Protocols used: PCP Call - No Triage-A-AH

## 2024-05-13 NOTE — Telephone Encounter (Signed)
 Called and spoke with the pt and relayed results per [place], NP Significant environmental allergies to dust, dogs, cats, grasses, trees, cockroaches, ragweed as well as other weeds common in our area. Please place referral to allergist. Thanks!  Pt is aware and has already scheduled appt. with allergy Nothing further needed.

## 2024-05-17 ENCOUNTER — Ambulatory Visit (HOSPITAL_BASED_OUTPATIENT_CLINIC_OR_DEPARTMENT_OTHER): Admitting: Cardiology

## 2024-05-20 DIAGNOSIS — R309 Painful micturition, unspecified: Secondary | ICD-10-CM | POA: Diagnosis not present

## 2024-05-24 ENCOUNTER — Other Ambulatory Visit (HOSPITAL_COMMUNITY): Payer: Self-pay

## 2024-05-24 MED ORDER — REPATHA SURECLICK 140 MG/ML ~~LOC~~ SOAJ: SUBCUTANEOUS | 0 refills | Status: DC

## 2024-05-25 ENCOUNTER — Other Ambulatory Visit (HOSPITAL_COMMUNITY): Payer: Self-pay

## 2024-05-25 MED ORDER — GABAPENTIN 100 MG PO CAPS
100.0000 mg | ORAL_CAPSULE | Freq: Three times a day (TID) | ORAL | 2 refills | Status: AC
  Filled 2024-05-25: qty 30, 10d supply, fill #0
  Filled 2024-05-31 – 2024-06-02 (×2): qty 30, 10d supply, fill #1

## 2024-05-25 MED ORDER — REPATHA 140 MG/ML ~~LOC~~ SOSY
140.0000 mg | PREFILLED_SYRINGE | SUBCUTANEOUS | 5 refills | Status: DC
  Filled 2024-05-25: qty 2, 28d supply, fill #0

## 2024-05-26 ENCOUNTER — Other Ambulatory Visit (HOSPITAL_COMMUNITY): Payer: Self-pay

## 2024-05-27 ENCOUNTER — Other Ambulatory Visit (HOSPITAL_COMMUNITY): Payer: Self-pay

## 2024-05-27 DIAGNOSIS — N390 Urinary tract infection, site not specified: Secondary | ICD-10-CM | POA: Diagnosis not present

## 2024-05-27 DIAGNOSIS — B0229 Other postherpetic nervous system involvement: Secondary | ICD-10-CM | POA: Diagnosis not present

## 2024-05-31 ENCOUNTER — Encounter: Payer: Self-pay | Admitting: *Deleted

## 2024-05-31 ENCOUNTER — Other Ambulatory Visit (HOSPITAL_COMMUNITY): Payer: Self-pay

## 2024-05-31 NOTE — Addendum Note (Signed)
 Addended by: CLAUDENE NEVINS A on: 05/31/2024 01:53 PM   Modules accepted: Orders

## 2024-06-01 ENCOUNTER — Other Ambulatory Visit: Payer: Self-pay

## 2024-06-01 DIAGNOSIS — R0609 Other forms of dyspnea: Secondary | ICD-10-CM

## 2024-06-01 NOTE — Addendum Note (Signed)
 Addended by: CLAUDENE NEVINS A on: 06/01/2024 09:42 AM   Modules accepted: Orders

## 2024-06-02 ENCOUNTER — Other Ambulatory Visit: Payer: Self-pay

## 2024-06-02 ENCOUNTER — Ambulatory Visit: Admitting: Allergy

## 2024-06-02 DIAGNOSIS — Z6836 Body mass index (BMI) 36.0-36.9, adult: Secondary | ICD-10-CM | POA: Diagnosis not present

## 2024-06-02 DIAGNOSIS — I700 Atherosclerosis of aorta: Secondary | ICD-10-CM | POA: Diagnosis not present

## 2024-06-02 DIAGNOSIS — R7303 Prediabetes: Secondary | ICD-10-CM | POA: Diagnosis not present

## 2024-06-02 DIAGNOSIS — I10 Essential (primary) hypertension: Secondary | ICD-10-CM | POA: Diagnosis not present

## 2024-06-02 DIAGNOSIS — E669 Obesity, unspecified: Secondary | ICD-10-CM | POA: Diagnosis not present

## 2024-06-03 ENCOUNTER — Ambulatory Visit

## 2024-06-03 ENCOUNTER — Encounter: Payer: Self-pay | Admitting: *Deleted

## 2024-06-03 ENCOUNTER — Ambulatory Visit: Attending: Internal Medicine | Admitting: Internal Medicine

## 2024-06-03 ENCOUNTER — Encounter: Payer: Self-pay | Admitting: Internal Medicine

## 2024-06-03 VITALS — BP 130/74 | HR 72 | Ht 63.0 in | Wt 212.0 lb

## 2024-06-03 DIAGNOSIS — I700 Atherosclerosis of aorta: Secondary | ICD-10-CM

## 2024-06-03 DIAGNOSIS — R079 Chest pain, unspecified: Secondary | ICD-10-CM

## 2024-06-03 DIAGNOSIS — R0609 Other forms of dyspnea: Secondary | ICD-10-CM

## 2024-06-03 DIAGNOSIS — G4730 Sleep apnea, unspecified: Secondary | ICD-10-CM

## 2024-06-03 DIAGNOSIS — R6884 Jaw pain: Secondary | ICD-10-CM

## 2024-06-03 DIAGNOSIS — M069 Rheumatoid arthritis, unspecified: Secondary | ICD-10-CM

## 2024-06-03 DIAGNOSIS — R931 Abnormal findings on diagnostic imaging of heart and coronary circulation: Secondary | ICD-10-CM | POA: Diagnosis not present

## 2024-06-03 DIAGNOSIS — I2510 Atherosclerotic heart disease of native coronary artery without angina pectoris: Secondary | ICD-10-CM

## 2024-06-03 DIAGNOSIS — I10 Essential (primary) hypertension: Secondary | ICD-10-CM | POA: Diagnosis not present

## 2024-06-03 MED ORDER — ASPIRIN 81 MG PO TBEC: 81.0000 mg | DELAYED_RELEASE_TABLET | Freq: Every day | ORAL | Status: AC

## 2024-06-03 NOTE — Progress Notes (Signed)
 Cardiology Office Note   Date:  06/03/2024  ID:  Elizabeth Roman, DOB [birth year], MRN [id number] PCP: Ransom Other, MD  [facility] Providers Cardiologist:  Emeline FORBES Calender, MD     History of Present Illness Elizabeth Roman is a 73 y.o. female with past medical history of hypertension, hyperlipidemia, fibromyalgia, OSA, GERD, obesity, nonobstructive CAD, elevated coronary calcium score, aortic atherosclerosis, rheumatoid arthritis who was previously followed by Dr. Hobart then Dr. Lonni and now is transferring care to myself.  Pertinent CV history: Prior history of lightheadedness; in 2022 monitor was unremarkable, echo with normal LVEF, mild MR. Calcium score 2023 was 674 (92nd percentile) with aortic atherosclerosis. Small hiatal hernia also noted. 11/2021 had cardiac PET for DOE, no evidence of ischemia or infarct, normal MBFR   Patient was previously seen on 10/01/2023 with Dr. Lonni as an urgent visit for shortness of breath and chest tightness x 1 month with a productive cough but was recommended to speak with PCP or pulmonology as her as her symptoms were less likely cardiac in nature given a prior reassuring cardiac workup and chronic type cardiac symptoms.  She was recently in the ED on 05/07/2024 with shortness of breath, left upper abdominal and back pain.  She was found to have shingles and now has some postherpetic neuralgia.  Today, she states that she gets exertional shortness of breath which she believes has been worsening over time.  She does admit to chest pressure intermittently and only occurs at night while she is sleeping.  She also has occasional jaw pain which does not occur with the shortness of breath or chest pain.  She is she is very concerned about her cardiac risk.   ROS:  Review of Systems  All other systems reviewed and are negative.   Physical Exam  Physical Exam Vitals and nursing note reviewed.  Constitutional:      Appearance: Normal  appearance.  HENT:     Head: Normocephalic and atraumatic.  Eyes:     Conjunctiva/sclera: Conjunctivae normal.  Neck:     Vascular: No carotid bruit.  Cardiovascular:     Rate and Rhythm: Normal rate and regular rhythm.  Pulmonary:     Effort: Pulmonary effort is normal.     Breath sounds: Normal breath sounds.  Musculoskeletal:        General: No swelling or tenderness.  Skin:    Coloration: Skin is not jaundiced or pale.  Neurological:     Mental Status: She is alert.     VS:  BP 130/74   Pulse 72   Ht 5' 3 (1.6 m)   Wt 212 lb (96.2 kg)   SpO2 95%   BMI 37.55 kg/m         Wt Readings from Last 3 Encounters:  06/03/24 212 lb (96.2 kg)  05/05/24 215 lb (97.5 kg)  12/29/23 205 lb 8 oz (93.2 kg)     EKG Interpretation Date/Time:    Ventricular Rate:    PR Interval:    QRS Duration:    QT Interval:    QTC Calculation:   R Axis:      Text Interpretation:      Studies Reviewed   Cardiac PET 12/18/2021: Normal perfusion without evidence of ischemia or infarction MBF are 2.5 to and normal Rest EF 56%, stress EF 67% Normal study, low risk study.  Coronary calcium score 11/01/2021: IMPRESSION: 1. Coronary calcium score of 674 is at the 92nd percentile for the patient's age, sex  and race. 2. Atherosclerosis of the thoracic aorta. 3. Calcifications of the mitral valve annulus. 4. Small hiatal hernia.   Echocardiogram 12/15/2020:  1. Left ventricular ejection fraction, by estimation, is 65 to 70%. The  left ventricle has normal function. The left ventricle has no regional  wall motion abnormalities. Left ventricular diastolic parameters are  indeterminate.   2. Right ventricular systolic function is normal. The right ventricular  size is normal.   3. Mild mitral valve regurgitation.   4. The aortic valve is tricuspid. Aortic valve regurgitation is not  visualized. Mild aortic valve sclerosis is present, with no evidence of  aortic valve stenosis.   5. The  inferior vena cava is normal in size with greater than 50%  respiratory variability, suggesting right atrial pressure of 3 mmHg.    Risk Assessment/Calculations             ASCVD risk score: The 10-year ASCVD risk score (Arnett DK, et al., 2019) is: 21.3%   Values used to calculate the score:     Age: 49 years     Clincally relevant sex: Female     Is Non-Hispanic African American: No     Diabetic: No     Tobacco smoker: No     Systolic Blood Pressure: 130 mmHg     Is BP treated: Yes     HDL Cholesterol: 60 mg/dL     Total Cholesterol: 161 mg/dL   ASSESSMENT  Exertional shortness of breath, possibly cardiac including CAD versus diastolic dysfunction CAD risk factors include coronary calcium score of 674 not on aspirin, rheumatoid arthritis, OSA, hypertension, hyperlipidemia.  She had a normal PET scan 2+ years ago and a reassuring echo in 2022.  Has been ongoing despite losing weight.  Seen by Dr. Lonni in March of this year but despite reassurance has continued to have symptoms. Chest pain, likely noncardiac only occurs at rest while sleeping.  Seen by Dr. Lonni in March of this year but despite reassurance has continued to have symptoms Jaw pain not reproducible.  Unlikely cardiac Hypertension on amlodipine  10 mg, losartan 100 mg Hyperlipidemia on Repatha  due to statin intolerance Elevated coronary calcium score Aortic atherosclerosis Rheumatoid arthritis OSA on CPAP Hiatal hernia Contrast allergy Statin intolerance did not tolerate statins due to severe leg and muscle pain.  Tried pravastatin and atorvastatin.  Did not try ezetimibe .  Currently on Repatha   Plan  Given her calcium score greater than 100 will start the patient on an aspirin 81 mg daily (looks like it was on prior notes however she has not been taking) Will check an echocardiogram to evaluate diastology, cardiac structure and function Will check an exercise Myoview given her risk factors,  exertional shortness of breath  Cardiac risk counseling and prevention recommendations: Heart healthy/Mediterranean diet with whole grains, fruits, vegetable, fish, lean meats, nuts, and olive oil. Limit salt. Moderate walking, 3-5 times/week for 30-50 minutes each session. Aim for at least 150 minutes.week. Goal should be pace of 3 miles/hour, or walking 1.5 miles in 30 minutes Avoidance of tobacco products. Avoid excess alcohol.  Follow up: 3 months     Informed Consent   Shared Decision Making/Informed Consent The risks [chest pain, shortness of breath, cardiac arrhythmias, dizziness, blood pressure fluctuations, myocardial infarction, stroke/transient ischemic attack, nausea, vomiting, allergic reaction, radiation exposure, metallic taste sensation and life-threatening complications (estimated to be 1 in 10,000)], benefits (risk stratification, diagnosing coronary artery disease, treatment guidance) and alternatives of a nuclear stress test were  discussed in detail with Ms. Violette and she agrees to proceed.       Signed, Emeline FORBES Calender, MD

## 2024-06-03 NOTE — Patient Instructions (Addendum)
 Medication Instructions:  Your physician has recommended you make the following change in your medication:  START Aspirin 81 mg daily  *If you need a refill on your cardiac medications before your next appointment, please call your pharmacy*  Lab Work: None ordered  If you have any lab test that is abnormal or we need to change your treatment, we will call you to review the results.  Testing/Procedures: Your physician has requested that you have an echocardiogram. Echocardiography is a painless test that uses sound waves to create images of your heart. It provides your doctor with information about the size and shape of your heart and how well your heart's chambers and valves are working. This procedure takes approximately one hour. There are no restrictions for this procedure. Please do NOT wear cologne, perfume, aftershave, or lotions (deodorant is allowed). Please arrive 15 minutes prior to your appointment time.  Please note: We ask at that you not bring children with you during ultrasound (echo/ vascular) testing. Due to room size and safety concerns, children are not allowed in the ultrasound rooms during exams. Our front office staff cannot provide observation of children in our lobby area while testing is being conducted. An adult accompanying a patient to their appointment will only be allowed in the ultrasound room at the discretion of the ultrasound technician under special circumstances. We apologize for any inconvenience.    Your physician has requested that you have en exercise stress myoview. For further information please visit [email or web address] Please follow instruction sheet, as given.   Follow-Up: At [facility], you and your health needs are our priority.  As part of our continuing mission to provide you with exceptional heart care, our providers are all part of one team.  This team includes your primary Cardiologist (physician) and Advanced Practice Providers  or APPs (Physician Assistants and Nurse Practitioners) who all work together to provide you with the care you need, when you need it.  Your next appointment:   3 month(s)  Provider:   Dr. Kriste   Thank you for choosing [facility]!!   [phone]

## 2024-06-09 ENCOUNTER — Ambulatory Visit: Admitting: Internal Medicine

## 2024-06-12 ENCOUNTER — Other Ambulatory Visit (HOSPITAL_COMMUNITY): Payer: Self-pay

## 2024-06-15 ENCOUNTER — Other Ambulatory Visit: Payer: Self-pay

## 2024-06-22 ENCOUNTER — Ambulatory Visit: Admitting: Pulmonary Disease

## 2024-06-22 ENCOUNTER — Encounter: Payer: Self-pay | Admitting: Pulmonary Disease

## 2024-06-22 VITALS — BP 104/62 | HR 77 | Ht 63.0 in | Wt 209.0 lb

## 2024-06-22 DIAGNOSIS — G4733 Obstructive sleep apnea (adult) (pediatric): Secondary | ICD-10-CM | POA: Diagnosis not present

## 2024-06-22 DIAGNOSIS — J4540 Moderate persistent asthma, uncomplicated: Secondary | ICD-10-CM | POA: Diagnosis not present

## 2024-06-22 NOTE — Patient Instructions (Addendum)
 Schedule pulmonary function tests at the front desk   Continue symbicort  2 puffs twice daily - rinse mouth out after each use  Your IgE level is very abnormal, please keep your appointment with the allergy team  Follow up in 4 months, call sooner if needed

## 2024-06-22 NOTE — Assessment & Plan Note (Signed)
 Orders:    Pulmonary Function Test; Future

## 2024-06-22 NOTE — Progress Notes (Signed)
 New Patient Pulmonology Office Visit   Subjective:  Patient ID: Elizabeth Roman, female    DOB: 10/07/1950  MRN: [id number]  Referred by: Elizabeth Other, MD  CC:  Chief Complaint  Patient presents with   Medical Management of Chronic Issues    Pt states SOB     Discussed the use of AI scribe software for clinical note transcription with the patient, who gave verbal consent to proceed.  History of Present Illness Elizabeth Roman is a 73 year old female with coronary artery disease and asthma who presents for chronic cough and asthma follow-up.  She reports a chronic, progressively worsening productive cough and shortness of breath over the past couple of years. The cough is daily, brings up mucus, and is socially embarrassing. Dyspnea occurs with exertion, especially walking uphill or climbing stairs. Symptoms are often worse in the morning and are triggered by cigarette smoke, perfumes, and weather changes.  She uses Symbicort  80/4.5 mcg two puffs twice daily and Incruse one puff daily but often takes Incruse only as needed. She also uses Flonase  for postnasal drip and allergic rhinitis and has a recent allergy panel positive for multiple allergens. She has not used systemic prednisone recently and strongly dislikes steroids due to prior experiences.  She has GERD with a small sliding hiatal hernia on CT chest and takes Protonix daily, though she sometimes skips doses and does not notice clear reflux symptoms even when she does. She denies significant heartburn.  She has coronary artery disease and hypertension. Her blood pressure medication was recently changed from lisinopril  to another agent due to concern for ACE-inhibitor cough, without clear improvement in her respiratory symptoms.  She reports chronic hoarseness for years with recent worsening. She uses CPAP for obstructive sleep apnea and reports regular device cleaning.  [place] She denies water  damage, mold, or a change in symptoms related to this home.        ROS  Allergies: Morphine and codeine, Prednisone, Atorvastatin, Atorvastatin calcium, Codeine, Hydroxychloroquine sulfate, Ivp dye [iodinated contrast media], Phenobarbital, Propoxyphene, Sulfa antibiotics, and Vicodin [hydrocodone-acetaminophen]  Current Outpatient Medications:    albuterol  (VENTOLIN  HFA) 108 (90 Base) MCG/ACT inhaler, Inhale 2 puffs into the lungs every 6 (six) hours as needed for wheezing or shortness of breath., Disp: 8 g, Rfl: 3   amLODipine  (NORVASC ) 10 MG tablet, TAKE 1 TABLET AT BEDTIME, Disp: 90 tablet, Rfl: 2   aspirin  EC 81 MG tablet, Take 1 tablet (81 mg total) by mouth daily. Swallow whole., Disp: , Rfl:    Bromelain 250 MG CAPS, Take by mouth daily., Disp: , Rfl:    budesonide -formoterol  (SYMBICORT ) 80-4.5 MCG/ACT inhaler, Inhale 2 puffs into the lungs in the morning and at bedtime., Disp: 1 each, Rfl: 11   Coenzyme Q10 (CO Q-10) 200 MG CAPS, Take 400 mg by mouth at bedtime., Disp: , Rfl:    Cranberry 500 MG CAPS, Take by mouth. 2 times per day, Disp: , Rfl:    Evolocumab  (REPATHA  SURECLICK) 140 MG/ML SOAJ, INJECT 140 MG INTO THE SKIN EVERY 14 (FOURTEEN) DAYS., Disp: 6 mL, Rfl: 1   ferrous sulfate 325 (65 FE) MG tablet, daily., Disp: , Rfl:    fluticasone  (FLONASE ) 50 MCG/ACT nasal spray, Place 2 sprays into both nostrils daily., Disp: 18.2 mL, Rfl: 2   gabapentin  (NEURONTIN ) 100 MG capsule, Take 1 capsule (100 mg total) by mouth 3 (three) times daily., Disp: 30 capsule, Rfl: 2  levothyroxine (SYNTHROID, LEVOTHROID) 75 MCG tablet, Take 75 mcg by mouth daily before breakfast., Disp: , Rfl:    losartan (COZAAR) 100 MG tablet, 1 tablet Orally Once a day for 90 days, Disp: , Rfl:    MAGNESIUM PO, Take 400 mg by mouth daily., Disp: , Rfl:    Multiple Vitamins-Minerals (ANTIOXIDANT PO), Take by mouth. 3 cap fulls daily, Disp: , Rfl:    naproxen  (NAPROSYN ) 375 MG tablet, Take 1 tablet (375 mg total)  by mouth 2 (two) times daily as needed., Disp: 20 tablet, Rfl: 0   OVER THE COUNTER MEDICATION, Stool softener 250 mg at bedtime, Disp: , Rfl:    OVER THE COUNTER MEDICATION, Glucosamine 2000mg  daily. Patient stated taking in powder form, Disp: , Rfl:    pantoprazole (PROTONIX) 40 MG tablet, Take 40 mg by mouth daily as needed (indigestion)., Disp: , Rfl:    Turmeric (CURCUMIN 95 PO), Take 1 tablet by mouth 2 (two) times daily., Disp: , Rfl:    umeclidinium bromide  (INCRUSE ELLIPTA ) 62.5 MCG/ACT AEPB, Inhale 1 puff into the lungs daily., Disp: 30 each, Rfl: 11   VITAMIN B COMPLEX-C CAPS, Take 2 capsules by mouth daily., Disp: , Rfl:    vitamin C (ASCORBIC ACID) 500 MG tablet, Take 500 mg by mouth 2 (two) times daily., Disp: , Rfl:    VITAMIN D PO, Take by mouth daily. Per patient taking 5,000 units taking every evening/, Disp: , Rfl:    zinc gluconate 50 MG tablet, Take 50 mg by mouth daily., Disp: , Rfl:    Evolocumab  (REPATHA ) 140 MG/ML SOSY, Inject 140 mg into the skin every 14 (fourteen) days., Disp: 2 mL, Rfl: 5 Past Medical History:  Diagnosis Date   Arthritis    High cholesterol    Hypertension    Left hip pain 09/2019   Multiple joint pain 03/2020   HANDICAP PLACARD    Sleep apnea    Thyroid  disease    Past Surgical History:  Procedure Laterality Date   ABDOMINAL HYSTERECTOMY     BREAST BIOPSY     BUNIONECTOMY     CARPAL TUNNEL RELEASE Left    HAND SURGERY Right    MEDIAL PARTIAL KNEE REPLACEMENT     TONSILLECTOMY     Family History  Problem Relation Age of Onset   Breast cancer Mother    Allergies Mother    Asthma Mother    Heart disease Father    Breast cancer Daughter    Cancer Daughter        breast   Social History   Socioeconomic History   Marital status: Married    Spouse name: Not on file   Number of children: 8   Years of education: Not on file   Highest education level: Not on file  Occupational History   Occupation: RETIRED HAIR DRESSER   Tobacco Use   Smoking status: Never   Smokeless tobacco: Never  Vaping Use   Vaping status: Never Used  Substance and Sexual Activity   Alcohol use: Yes    Comment: occ   Drug use: No   Sexual activity: Not on file  Roman Topics Concern   Not on file  Social History Narrative   Retired interior and spatial designer    Social Drivers of Corporate Investment Banker Strain: Not on file  Food Insecurity: Not on file  Transportation Needs: Not on file  Physical Activity: Not on file  Stress: Not on file  Social Connections: Not on file  Intimate Partner Violence: Unknown (12/16/2021)   Received from [facility]   Intimate Partner Violence    Fear of Current or Ex-Partner: Not on file       Objective:  BP 104/62   Pulse 77   Ht 5' 3 (1.6 m) Comment: per pt  Wt 209 lb (94.8 kg)   SpO2 96%   BMI 37.02 kg/m    Physical Exam  Diagnostic Review:  Last CBC Lab Results  Component Value Date   WBC 6.3 05/07/2024   HGB 12.6 05/07/2024   HCT 41.1 05/07/2024   MCV 88.6 05/07/2024   MCH 27.2 05/07/2024   RDW 13.9 05/07/2024   PLT 228 05/07/2024   Last metabolic panel Lab Results  Component Value Date   GLUCOSE 109 (H) 05/07/2024   NA 139 05/07/2024   K 4.8 05/07/2024   CL 103 05/07/2024   CO2 23 05/07/2024   BUN 35 (H) 05/07/2024   CREATININE 1.09 (H) 05/07/2024   GFRNONAA 53 (L) 05/07/2024   CALCIUM 10.6 (H) 05/07/2024   PROT 7.9 05/07/2024   ALBUMIN 4.7 05/07/2024   BILITOT 0.5 05/07/2024   ALKPHOS 85 05/07/2024   AST 26 05/07/2024   ALT 19 05/07/2024   ANIONGAP 12 05/07/2024   Total IgE: 4,168    Assessment & Plan:   Assessment & Plan OSA (obstructive sleep apnea)     Moderate persistent asthma without complication  Orders:   Pulmonary Function Test; Future   Assessment and Plan Assessment & Plan Moderate persistent asthma with chronic cough and shortness of breath Chronic cough and shortness of breath likely due to asthma. No wheezing. Allergy panel shows  significant IgE level.  Biologic therapy may be considered based on allergist's evaluation. - Continue Symbicort  80/4.5 mcg, two puffs twice daily. - Use Incruse as needed. - Referred to allergist for evaluation and potential biologic therapy. - Avoid prednisone or steroids before allergist visit.  Allergic rhinitis and chronic sinus congestion Chronic sinus congestion and postnasal drip contributing to cough. Positive allergy panel with multiple allergens. - Continue Flonase  nasal spray for postnasal drip and allergic rhinitis. - Referred to allergist for further evaluation and management.  Obstructive sleep apnea Managed with CPAP. No current issues with CPAP use. - Continue CPAP therapy.  Gastroesophageal reflux disease and small sliding hiatal hernia GERD managed with Protonix. Small sliding hiatal hernia noted on imaging. - Continue Protonix daily as prescribed.      No follow-ups on file.   Dorn KATHEE Chill, MD

## 2024-06-28 ENCOUNTER — Ambulatory Visit
Admission: RE | Admit: 2024-06-28 | Discharge: 2024-06-28 | Disposition: A | Source: Ambulatory Visit | Attending: Internal Medicine | Admitting: Internal Medicine

## 2024-06-28 ENCOUNTER — Encounter: Payer: Self-pay | Admitting: Pulmonary Disease

## 2024-06-28 DIAGNOSIS — Z1231 Encounter for screening mammogram for malignant neoplasm of breast: Secondary | ICD-10-CM

## 2024-06-30 DIAGNOSIS — I10 Essential (primary) hypertension: Secondary | ICD-10-CM | POA: Diagnosis not present

## 2024-06-30 DIAGNOSIS — I700 Atherosclerosis of aorta: Secondary | ICD-10-CM | POA: Diagnosis not present

## 2024-06-30 DIAGNOSIS — Z6836 Body mass index (BMI) 36.0-36.9, adult: Secondary | ICD-10-CM | POA: Diagnosis not present

## 2024-06-30 DIAGNOSIS — R7303 Prediabetes: Secondary | ICD-10-CM | POA: Diagnosis not present

## 2024-06-30 DIAGNOSIS — E669 Obesity, unspecified: Secondary | ICD-10-CM | POA: Diagnosis not present

## 2024-07-01 ENCOUNTER — Telehealth (HOSPITAL_COMMUNITY): Payer: Self-pay | Admitting: *Deleted

## 2024-07-01 NOTE — Telephone Encounter (Signed)
 Spoke to patient as a reminder about her Stress Test on 07/09/24 at 12:30.

## 2024-07-05 ENCOUNTER — Telehealth: Payer: Self-pay | Admitting: Allergy

## 2024-07-05 ENCOUNTER — Encounter: Payer: Self-pay | Admitting: Allergy

## 2024-07-05 ENCOUNTER — Ambulatory Visit: Admitting: Allergy

## 2024-07-05 VITALS — BP 110/72 | HR 86 | Temp 98.2°F | Ht 63.0 in | Wt 210.2 lb

## 2024-07-05 DIAGNOSIS — J4540 Moderate persistent asthma, uncomplicated: Secondary | ICD-10-CM

## 2024-07-05 DIAGNOSIS — J3089 Other allergic rhinitis: Secondary | ICD-10-CM

## 2024-07-05 DIAGNOSIS — R490 Dysphonia: Secondary | ICD-10-CM | POA: Diagnosis not present

## 2024-07-05 DIAGNOSIS — K219 Gastro-esophageal reflux disease without esophagitis: Secondary | ICD-10-CM

## 2024-07-05 MED ORDER — IPRATROPIUM BROMIDE 0.03 % NA SOLN: 1.0000 | Freq: Two times a day (BID) | NASAL | 5 refills | Status: AC | PRN

## 2024-07-05 NOTE — Patient Instructions (Addendum)
 Environmental allergies 05/05/2024 labs positive to dust mites, cat, dog, grass, mold, ragweed. Borderline to trees, weed. Start environmental control measures below. Use over the counter antihistamines such as Zyrtec (cetirizine), Claritin (loratadine), Allegra (fexofenadine), or Xyzal (levocetirizine) daily as needed. May switch antihistamines every few months. Use Atrovent  (ipratropium) 0.03% 1-2 sprays per nostril twice a day as needed for runny nose/drainage. Use Flonase  (fluticasone ) nasal spray 1-2 sprays per nostril once a day as needed for nasal congestion.  Nasal saline spray (i.e., Simply Saline) or nasal saline lavage (i.e., NeilMed) is recommended as needed and prior to medicated nasal sprays.  Throat issues/voice changes Refer to ENT - history of vocal cord polyps s/p surgery in 1973.  Breathing Read about injectables for asthma. Fasenra, Nucala, Te[place]spire.  Continue to follow up with pulmonology. Daily controller medication(s): Symbicort  80mcg 2 puffs twice a day with spacer and rinse mouth afterwards. May use albuterol  rescue inhaler 2 puffs every 4 to 6 hours as needed for shortness of breath, chest tightness, coughing, and wheezing.  Monitor frequency of use - if you need to use it more than twice per week on a consistent basis let us  know.  Breathing control goals:  Full participation in all desired activities (may need albuterol  before activity) Albuterol  use two times or less a week on average (not counting use with activity) Cough interfering with sleep two times or less a month Oral steroids no more than once a year No hospitalizations   Reflux See handout for lifestyle and dietary modifications. Continue pantoprazole 40mg  once day - nothing to eat or drink for 20-30 minutes afterwards.   Follow up in 2 months or sooner if needed.   Control of House Dust Mite Allergen Dust mite allergens are a common trigger of allergy and asthma symptoms. While they can be found  throughout the house, these microscopic creatures thrive in warm, humid environments such as bedding, upholstered furniture and carpeting. Because so much time is spent in the bedroom, it is essential to reduce mite levels there.  Encase pillows, mattresses, and box springs in special allergen-proof fabric covers or airtight, zippered plastic covers.  Bedding should be washed weekly in hot water (130 F) and dried in a hot dryer. Allergen-proof covers are available for comforters and pillows that can't be regularly washed.  Wash the allergy-proof covers every few months. Minimize clutter in the bedroom. Keep pets out of the bedroom.  Keep humidity less than 50% by using a dehumidifier or air conditioning. You can buy a humidity measuring device called a hygrometer to monitor this.  If possible, replace carpets with hardwood, linoleum, or washable area rugs. If that's not possible, vacuum frequently with a vacuum that has a HEPA filter. Remove all upholstered furniture and non-washable window drapes from the bedroom. Remove all non-washable stuffed toys from the bedroom.  Wash stuffed toys weekly.  Pet Allergen Avoidance: Contrary to popular opinion, there are no "hypoallergenic" breeds of dogs or cats. That is because people are not allergic to an animal's hair, but to an allergen found in the animal's saliva, dander (dead skin flakes) or urine. Pet allergy symptoms typically occur within minutes. For some people, symptoms can build up and become most severe 8 to 12 hours after contact with the animal. People with severe allergies can experience reactions in public places if dander has been transported on the pet owners' clothing. Keeping an animal outdoors is only a partial solution, since homes with pets in the yard still have higher concentrations  of animal allergens. Before getting a pet, ask your allergist to determine if you are allergic to animals. If your pet is already considered part of your  family, try to minimize contact and keep the pet out of the bedroom and other rooms where you spend a great deal of time. As with dust mites, vacuum carpets often or replace carpet with a hardwood floor, tile or linoleum. High-efficiency particulate air (HEPA) cleaners can reduce allergen levels over time. While dander and saliva are the source of cat and dog allergens, urine is the source of allergens from rabbits, hamsters, mice and guinea pigs; so ask a non-allergic family member to clean the animal's cage. If you have a pet allergy, talk to your allergist about the potential for allergy immunotherapy (allergy shots). This strategy can often provide long-term relief.  Reducing Pollen Exposure Pollen seasons: trees (spring), grass (summer) and ragweed/weeds (fall). Keep windows closed in your home and car to lower pollen exposure.  Install air conditioning in the bedroom and throughout the house if possible.  Avoid going out in dry windy days - especially early morning. Pollen counts are highest between 5 - 10 AM and on dry, hot and windy days.  Save outside activities for late afternoon or after a heavy rain, when pollen levels are lower.  Avoid mowing of grass if you have grass pollen allergy. Be aware that pollen can also be transported indoors on people and pets.  Dry your clothes in an automatic dryer rather than hanging them outside where they might collect pollen.  Rinse hair and eyes before bedtime.  Mold Control Mold and fungi can grow on a variety of surfaces provided certain temperature and moisture conditions exist.  Outdoor molds grow on plants, decaying vegetation and soil. The major outdoor mold, Alternaria and Cladosporium, are found in very high numbers during hot and dry conditions. Generally, a late summer - fall peak is seen for common outdoor fungal spores. Rain will temporarily lower outdoor mold spore count, but counts rise rapidly when the rainy period ends. The most  important indoor molds are Aspergillus and Penicillium. Dark, humid and poorly ventilated basements are ideal sites for mold growth. The next most common sites of mold growth are the bathroom and the kitchen. Outdoor (Seasonal) Mold Control Use air conditioning and keep windows closed. Avoid exposure to decaying vegetation. Avoid leaf raking. Avoid grain handling. Consider wearing a face mask if working in moldy areas.  Indoor (Perennial) Mold Control  Maintain humidity below 50%. Get rid of mold growth on hard surfaces with water, detergent and, if necessary, 5% bleach (do not mix with other cleaners). Then dry the area completely. If mold covers an area more than 10 square feet, consider hiring an indoor environmental professional. For clothing, washing with soap and water is best. If moldy items cannot be cleaned and dried, throw them away. Remove sources e.g. contaminated carpets. Repair and seal leaking roofs or pipes. Using dehumidifiers in damp basements may be helpful, but empty the water and clean units regularly to prevent mildew from forming. All rooms, especially basements, bathrooms and kitchens, require ventilation and cleaning to deter mold and mildew growth. Avoid carpeting on concrete or damp floors, and storing items in damp areas. Cockroach Allergen Avoidance Cockroaches are often found in the homes of densely populated urban areas, schools or commercial buildings, but these creatures can lurk almost anywhere. This does not mean that you have a dirty house or living area. Block all areas where the [employer]  can enter the home. This includes crevices, wall cracks and windows.  Cockroaches need water to survive, so fix and seal all leaky faucets and pipes. Have an exterminator go through the house when your family and pets are gone to eliminate any remaining roaches. Keep food in lidded containers and put pet food dishes away after your pets are done eating. Vacuum and sweep the floor  after meals, and take out garbage and recyclables. Use lidded garbage containers in the kitchen. Wash dishes immediately after use and clean under stoves, refrigerators or toasters where crumbs can accumulate. Wipe off the stove and other kitchen surfaces and cupboards regularly.

## 2024-07-05 NOTE — Telephone Encounter (Signed)
 Please refer to ENT for voice hoarseness. History of vocal cord polyps s/p removal in 1973.

## 2024-07-05 NOTE — Progress Notes (Addendum)
 New Patient Note  RE: Elizabeth Roman MRN: [id number] DOB: 01-Jul-1951 Date of Office Visit: 07/05/2024  Consult requested by: Malachy Comer GAILS, NP Primary care provider: Ransom Other, MD  Chief Complaint: Allergic Rhinitis  (Ears feels plugged up and itchy/Nose hard to breathe, has been using Flonase , sense of smell is not very good, has a runny nose.Bonner, itchy, but not too bad, had cataract surgery, watery once in a while./Throat, has been hoarse, clearing her throat, has post nasal drip. Ludwig, has been pretty good. ) and Asthma (Has been wheezing this morning.  Happens especially when rushing and could depend on what the weather is that day.  Has been having coughing all the time and is always hoarse, wheezing, SOB occasionally,  chest tightness when having a hard time breathing, sleeps with a CPAP. Waking up at night time with her coughing.)  History of Present Illness: I had the pleasure of seeing Elizabeth Roman for initial evaluation at the [facility] on 07/05/2024. She is a 73 y.o. female, who is referred here by Malachy Comer GAILS, NP for the evaluation of allergic rhinitis.  Discussed the use of AI scribe software for clinical note transcription with the patient, who gave verbal consent to proceed.    She has long-standing sinus issues characterized by nasal congestion, rhinorrhea, sneezing, and postnasal drip. Her voice has become increasingly hoarse over time, and she has not identified any specific time of year when her symptoms worsen. Her sense of smell is diminished, particularly for faint odors. No frequent headaches. She has been using Flonase .  She has a history of asthma, diagnosed approximately four years ago when her breathing worsened. She uses Symbicort  80mcg, two puffs twice daily, and albuterol  as needed. Recently, she experienced a significant episode of dyspnea requiring additional use of albuterol  and Incruse. Her breathing difficulties have been more  pronounced over the past month.  She has a history of gastroesophageal reflux disease (GERD) and takes pantoprazole daily, which has controlled her symptoms. She has a history of vocal cord polyps removed in 1973, and her voice remains hoarse, with some days being worse than others.  Her family history includes asthma in her mother and heart disease. She lives with her family, including two dogs, a Chihuahua and a Orthoptist, which she has had for several years. 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  No recent fevers, chills, or frequent sinus infections. Occasional coughing, wheezing, and shortness of breath, with frequent hoarseness and coughing. No frequent infections requiring antibiotics.     She reports symptoms of nasal congestion, rhinorrhea, sneezing, PND. Symptoms have been going on for many years. The symptoms are present all year around. Anosmia: diminished sense of smell. Headache: seldom. She has used Flonase  with minimal improvement in symptoms. Sinus infections: no. Previous work up includes: 05/05/2024 labs positive to dust mites, cat, dog, grass, mold, ragweed. Borderline to trees, weed. Previous ENT evaluation: yes and was told that she needs sleep evaluation. History of reflux: currently on protonix daily.  06/22/2024 pulm visit: Moderate persistent asthma with chronic cough and shortness of breath Chronic cough and shortness of breath likely due to asthma. No wheezing. Allergy panel shows significant IgE level.  Biologic therapy may be considered based on allergist's evaluation. - Continue Symbicort  80/4.5 mcg, two puffs twice daily. - Use Incruse as needed. - Referred to allergist for evaluation and potential biologic therapy. - Avoid prednisone or steroids before allergist visit.  Allergic rhinitis and chronic sinus congestion Chronic sinus congestion and postnasal drip contributing to cough. Positive allergy panel with multiple  allergens. - Continue Flonase  nasal spray for postnasal drip and allergic rhinitis. - Referred to allergist for further evaluation and management.   Obstructive sleep apnea Managed with CPAP. No current issues with CPAP use. - Continue CPAP therapy.   Gastroesophageal reflux disease and small sliding hiatal hernia GERD managed with Protonix. Small sliding hiatal hernia noted on imaging. - Continue Protonix daily as prescribed.  Component     Latest Ref Rng 05/05/2024  IgE (Immunoglobulin E), Serum     6 - 495 IU/mL 4,166 (H)   D Pteronyssinus IgE     Class V kU/L 38.90 !   D Farinae IgE     Class V kU/L 38.30 !   Cat Dander IgE     Class III kU/L 2.93 !   Dog Dander IgE     Class I kU/L 0.37 !   Bermuda Grass IgE     Class I kU/L 0.45 !   Timothy Grass IgE     Class I kU/L 0.51 !   [place]  Bluegrass IgE     Class II kU/L 0.70 !   Johnson Grass IgE     Class I kU/L 0.50 !   Bahia Grass IgE     Class I kU/L 0.48 !   Cockroach, American IgE     Class 0/I kU/L 0.26 !   Penicillium Chrysogen IgE     Class II kU/L 0.75 !   Cladosporium Herbarum IgE     Class I kU/L 0.37 !   Aspergillus Fumigatus IgE     Class II kU/L 1.37 !   Mucor Racemosus IgE     Class 0/I kU/L 0.26 !   Alternaria Alternata IgE     Class II kU/L 0.66 !   Setomelanomma Rostrat     Class 0/I kU/L 0.26 !   Oak, [place] IgE     Class 0/I kU/L 0.24 !   Elm, American IgE     Class 0/I kU/L 0.31 !   Maple/Box Elder IgE     Class 0/I kU/L 0.27 !   Common Silver Valrie IgE     Class 0/I kU/L 0.14 !   Hickory, White IgE     Class 0 kU/L <0.10   White Mulberry IgE     Class 0/I kU/L 0.13 !   Charter Oak, [place] IgE     Class 0/I kU/L 0.19 !   Ragweed, Short IgE     Class III kU/L 1.65 !   Plantain, English IgE     Class 0/I kU/L 0.14 !   Cocklebur IgE     Class II kU/L 0.81 !   Pigweed, Rough IgE     Class 0/I kU/L 0.16 !     05/07/2024 CXR: IMPRESSION: 1. No acute abnormality. 2. Stable mild  chronic interstitial lung disease.  Assessment and Plan: Jerrika is a 73 y.o. female with: Other allergic rhinitis Chronic allergic rhinitis with nasal congestion, postnasal drip, and hoarseness. 05/05/2024 labs positive to dust mites, cat, dog, grass, mold, ragweed. Borderline to trees, weed. IgE elevated. Has CPAP. Start environmental control measures below. Use over the counter antihistamines such as Zyrtec (cetirizine), Claritin (loratadine), Allegra (fexofenadine), or Xyzal (levocetirizine) daily as needed. May switch antihistamines every few months. Use Atrovent  (ipratropium) 0.03% 1-2 sprays per nostril twice a day as needed for runny nose/drainage. Use Flonase  (fluticasone ) nasal  spray 1-2 sprays per nostril once a day as needed for nasal congestion.  Nasal saline spray (i.e., Simply Saline) or nasal saline lavage (i.e., NeilMed) is recommended as needed and prior to medicated nasal sprays. If above plan ineffective, will discuss starting immunotherapy. Most likely will need skin testing as some of the lab results most likely overly sensitive due to elevated IgE level.  Voice hoarseness History of polyps on vocal cords s/p surgery in 1973 per patient. Refer to ENT.  Gastroesophageal reflux disease, unspecified whether esophagitis present See handout for lifestyle and dietary modifications. Continue pantoprazole 40mg  once day - nothing to eat or drink for 20-30 minutes afterwards.   Moderate persistent asthma without complication Follows with pulm. 2025 CXR (Stable mild chronic interstitial lung disease). Eos 200. Today's spirometry was unremarkable but used albuterol  1.5 hours prior to visit and took 3 puffs. Read about injectables for asthma. Fasenra, Nucala, [place]spire.  Continue to follow up with pulmonology. Daily controller medication(s): Symbicort  80mcg 2 puffs twice a day with spacer and rinse mouth afterwards. May use albuterol  rescue inhaler 2 puffs every 4 to 6 hours as  needed for shortness of breath, chest tightness, coughing, and wheezing.  Monitor frequency of use - if you need to use it more than twice per week on a consistent basis let us  know.   Return in about 2 months (around 09/05/2024).  Meds ordered this encounter  Medications   ipratropium (ATROVENT ) 0.03 % nasal spray    Sig: Place 1-2 sprays into both nostrils 2 (two) times daily as needed (nasal drainage).    Dispense:  30 mL    Refill:  5   Lab Orders  No laboratory test(s) ordered today    Other allergy screening: Asthma: yes Currently on Symbicort  80mcg 2 puffs twice a day, Incruse prn, albuterol  prn    Food allergy: no Medication allergy: yes Hymenoptera allergy: no Urticaria: no Eczema:no History of recurrent infections suggestive of immunodeficency: no  Diagnostics: Spirometry:  Tracings reviewed. Her effort: Good reproducible efforts. FVC: 2.34L FEV1: 1.64L, 82% predicted FEV1/FVC ratio: 70% Interpretation: Spirometry consistent with normal pattern.  Please see scanned spirometry results for details.  Results discussed with patient/family.   Past Medical History: Patient Active Problem List   Diagnosis Date Noted   Pain of left hip joint 12/12/2023   Chronic cough 11/25/2023   Lumbar radiculopathy 05/20/2023   Spondylolisthesis of lumbar region 05/20/2023   Lumbar spondylosis 05/20/2023   Carpal tunnel syndrome of right wrist 05/01/2023   Osteoarthritis of first carpometacarpal joint of right hand 05/01/2023   Asthma 03/16/2023   Elevated coronary artery calcium score 07/26/2022   Obesity, Class II, BMI 35-39.9 07/30/2018   Status post total left knee replacement 07/30/2018   Fibromyalgia 07/15/2018   Sleep apnea with use of continuous positive airway pressure (CPAP) 07/15/2018   Gastroesophageal reflux disease 10/10/2012   Hyperlipidemia 10/10/2012   Essential hypertension 01/15/2012   Occlusion of carotid artery 02/15/2008   Past Medical History:   Diagnosis Date   Arthritis    High cholesterol    Hypertension    Left hip pain 09/2019   Multiple joint pain 03/2020   HANDICAP PLACARD    Sleep apnea    Thyroid  disease    Past Surgical History: Past Surgical History:  Procedure Laterality Date   ABDOMINAL HYSTERECTOMY     base joint  Left    hand   BLADDER REPAIR  2001   x2   BREAST BIOPSY  BUNIONECTOMY     x2   CARPAL TUNNEL RELEASE Left    HAND SURGERY Right    MEDIAL PARTIAL KNEE REPLACEMENT     pulletts on vocal cords  1973   REPLACEMENT TOTAL KNEE     2006 & 2010   TONSILLECTOMY  1955   Medication List:  Current Outpatient Medications  Medication Sig Dispense Refill   albuterol  (VENTOLIN  HFA) 108 (90 Base) MCG/ACT inhaler Inhale 2 puffs into the lungs every 6 (six) hours as needed for wheezing or shortness of breath. 8 g 3   amLODipine  (NORVASC ) 10 MG tablet TAKE 1 TABLET AT BEDTIME 90 tablet 2   budesonide -formoterol  (SYMBICORT ) 80-4.5 MCG/ACT inhaler Inhale 2 puffs into the lungs in the morning and at bedtime. 1 each 11   Evolocumab  (REPATHA  SURECLICK) 140 MG/ML SOAJ INJECT 140 MG INTO THE SKIN EVERY 14 (FOURTEEN) DAYS. 6 mL 1   ferrous sulfate 325 (65 FE) MG tablet daily.     fluticasone  (FLONASE ) 50 MCG/ACT nasal spray Place 2 sprays into both nostrils daily. 18.2 mL 2   gabapentin  (NEURONTIN ) 100 MG capsule Take 1 capsule (100 mg total) by mouth 3 (three) times daily. 30 capsule 2   ipratropium (ATROVENT ) 0.03 % nasal spray Place 1-2 sprays into both nostrils 2 (two) times daily as needed (nasal drainage). 30 mL 5   levothyroxine (SYNTHROID, LEVOTHROID) 75 MCG tablet Take 75 mcg by mouth daily before breakfast.     losartan (COZAAR) 100 MG tablet 1 tablet Orally Once a day for 90 days     naproxen  (NAPROSYN ) 375 MG tablet Take 1 tablet (375 mg total) by mouth 2 (two) times daily as needed. 20 tablet 0   pantoprazole (PROTONIX) 40 MG tablet Take 40 mg by mouth daily as needed (indigestion).      umeclidinium bromide  (INCRUSE ELLIPTA ) 62.5 MCG/ACT AEPB Inhale 1 puff into the lungs daily. 30 each 11   aspirin  EC 81 MG tablet Take 1 tablet (81 mg total) by mouth daily. Swallow whole.     Bromelain 250 MG CAPS Take by mouth daily.     Coenzyme Q10 (CO Q-10) 200 MG CAPS Take 400 mg by mouth at bedtime.     Cranberry 500 MG CAPS Take by mouth. 2 times per day     MAGNESIUM PO Take 400 mg by mouth daily.     Multiple Vitamins-Minerals (ANTIOXIDANT PO) Take by mouth. 3 cap fulls daily     OVER THE COUNTER MEDICATION Stool softener 250 mg at bedtime     OVER THE COUNTER MEDICATION Glucosamine 2000mg  daily. Patient stated taking in powder form     Turmeric (CURCUMIN 95 PO) Take 1 tablet by mouth 2 (two) times daily.     VITAMIN B COMPLEX-C CAPS Take 2 capsules by mouth daily.     vitamin C (ASCORBIC ACID) 500 MG tablet Take 500 mg by mouth 2 (two) times daily.     VITAMIN D PO Take by mouth daily. Per patient taking 5,000 units taking every evening/     zinc gluconate 50 MG tablet Take 50 mg by mouth daily.     No current facility-administered medications for this visit.   Allergies: Allergies  Allergen Reactions   Morphine And Codeine Anaphylaxis   Prednisone Hives and Other (See Comments)   Atorvastatin Other (See Comments)   Atorvastatin Calcium Other (See Comments)   Celecoxib Dermatitis   Codeine Other (See Comments)    Very ill.   Hydroxychloroquine Sulfate  Other reaction(s): no benefit   Ivp Dye [Iodinated Contrast Media] Hives   Lisinopril  Cough   Phenobarbital Hives   Propoxyphene    Semaglutide  Nausea Only   Sulfa Antibiotics Hives   Vicodin [Hydrocodone-Acetaminophen] Other (See Comments)    Very ill.   Social History: Social History   Socioeconomic History   Marital status: Married    Spouse name: Nancyann   Number of children: 8   Years of education: Not on file   Highest education level: Not on file  Occupational History   Occupation: RETIRED HAIR  DRESSER  Tobacco Use   Smoking status: Never    Passive exposure: Never   Smokeless tobacco: Never  Vaping Use   Vaping status: Never Used  Substance and Sexual Activity   Alcohol use: Yes    Comment: occ   Drug use: No   Sexual activity: Not on file  Other Topics Concern   Not on file  Social History Narrative   Retired interior and spatial designer    Social Drivers of Corporate Investment Banker Strain: Not on file  Food Insecurity: Not on file  Transportation Needs: Not on file  Physical Activity: Not on file  Stress: Not on file  Social Connections: Not on file   Lives in a house. Smoking: denies Occupation: retired Youth Worker HistorySurveyor, Minerals in the house: no Engineer, Civil (consulting) in the family room: no Carpet in the bedroom: no Heating: gas Cooling: central Pet: yes 2 dogsx 10 yrs  Family History: Family History  Problem Relation Age of Onset   Breast cancer Mother    Allergies Mother    Asthma Mother    Heart disease Father    Breast cancer Daughter    Cancer Daughter        breast   Review of Systems  Constitutional:  Negative for appetite change, chills, fever and unexpected weight change.  HENT:  Positive for congestion, postnasal drip, rhinorrhea and voice change.   Eyes:  Negative for itching.  Respiratory:  Positive for cough, chest tightness, shortness of breath and wheezing.   Cardiovascular:  Negative for chest pain.  Gastrointestinal:  Negative for abdominal pain.  Genitourinary:  Negative for difficulty urinating.  Skin:  Negative for rash.  Neurological:  Negative for headaches.    Objective: BP 110/72   Pulse 86   Temp 98.2 F (36.8 C)   Ht 5' 3 (1.6 m)   Wt 210 lb 3.2 oz (95.3 kg)   SpO2 94%   BMI 37.24 kg/m  Body mass index is 37.24 kg/m. Physical Exam Vitals and nursing note reviewed.  Constitutional:      Appearance: Normal appearance. She is well-developed.  HENT:     Head: Normocephalic and atraumatic.     Right  Ear: Tympanic membrane and external ear normal.     Left Ear: Tympanic membrane and external ear normal.     Nose: Nose normal.     Mouth/Throat:     Mouth: Mucous membranes are moist.     Pharynx: Oropharynx is clear.  Eyes:     Conjunctiva/sclera: Conjunctivae normal.  Cardiovascular:     Rate and Rhythm: Normal rate and regular rhythm.     Heart sounds: Normal heart sounds. No murmur heard.    No friction rub. No gallop.  Pulmonary:     Effort: Pulmonary effort is normal.     Breath sounds: Normal breath sounds. No wheezing, rhonchi or rales.  Musculoskeletal:     Cervical back:  Neck supple.  Skin:    General: Skin is warm.     Findings: No rash.  Neurological:     Mental Status: She is alert and oriented to person, place, and time.  Psychiatric:        Behavior: Behavior normal.    The plan was reviewed with the patient/family, and all questions/concerned were addressed.  It was my pleasure to see Zaelynn today and participate in her care. Please feel free to contact me with any questions or concerns.  Sincerely,  Orlan Cramp, DO Allergy & Immunology  [facility]  [place] office: [phone] [facility] office: [phone]

## 2024-07-05 NOTE — Addendum Note (Signed)
 Addended by: Dayane Hillenburg M on: 07/05/2024 01:06 PM   Modules accepted: Orders, Level of Service

## 2024-07-06 ENCOUNTER — Other Ambulatory Visit: Payer: Self-pay | Admitting: Internal Medicine

## 2024-07-06 DIAGNOSIS — R0609 Other forms of dyspnea: Secondary | ICD-10-CM

## 2024-07-06 DIAGNOSIS — I2510 Atherosclerotic heart disease of native coronary artery without angina pectoris: Secondary | ICD-10-CM

## 2024-07-07 DIAGNOSIS — G4733 Obstructive sleep apnea (adult) (pediatric): Secondary | ICD-10-CM | POA: Diagnosis not present

## 2024-07-09 ENCOUNTER — Ambulatory Visit: Payer: Self-pay | Admitting: Internal Medicine

## 2024-07-09 ENCOUNTER — Ambulatory Visit (HOSPITAL_COMMUNITY): Admission: RE | Admit: 2024-07-09 | Discharge: 2024-07-09 | Attending: Internal Medicine

## 2024-07-09 DIAGNOSIS — R0609 Other forms of dyspnea: Secondary | ICD-10-CM

## 2024-07-09 DIAGNOSIS — I2510 Atherosclerotic heart disease of native coronary artery without angina pectoris: Secondary | ICD-10-CM

## 2024-07-09 LAB — ECHOCARDIOGRAM COMPLETE
Area-P 1/2: 4.04 cm2
MV VTI: 3.12 cm2
S' Lateral: 2.6 cm

## 2024-07-09 LAB — MYOCARDIAL PERFUSION IMAGING
Base ST Depression (mm): 0 mm
LV dias vol: 90 mL (ref 46–106)
LV sys vol: 26 mL (ref 3.8–5.2)
Nuc Stress EF: 71 %
Peak HR: 90 {beats}/min
Rest HR: 80 {beats}/min
Rest Nuclear Isotope Dose: 9.8 mCi
SDS: 1
SRS: 1
SSS: 1
ST Depression (mm): 0 mm
Stress Nuclear Isotope Dose: 30.7 mCi
TID: 0.91

## 2024-07-09 MED ORDER — REGADENOSON 0.4 MG/5ML IV SOLN
0.4000 mg | Freq: Once | INTRAVENOUS | Status: AC
  Administered 2024-07-09: 0.4 mg via INTRAVENOUS

## 2024-07-09 MED ORDER — TECHNETIUM TC 99M TETROFOSMIN IV KIT
30.7000 | PACK | Freq: Once | INTRAVENOUS | Status: AC | PRN
  Administered 2024-07-09: 30.7 via INTRAVENOUS

## 2024-07-09 MED ORDER — TECHNETIUM TC 99M TETROFOSMIN IV KIT
9.8000 | PACK | Freq: Once | INTRAVENOUS | Status: AC | PRN
  Administered 2024-07-09: 9.8 via INTRAVENOUS

## 2024-07-09 MED ORDER — REGADENOSON 0.4 MG/5ML IV SOLN
INTRAVENOUS | Status: AC
  Filled 2024-07-09: qty 5

## 2024-07-13 ENCOUNTER — Ambulatory Visit: Payer: Self-pay | Admitting: Internal Medicine

## 2024-07-25 ENCOUNTER — Other Ambulatory Visit: Payer: Self-pay | Admitting: Nurse Practitioner

## 2024-07-25 DIAGNOSIS — J309 Allergic rhinitis, unspecified: Secondary | ICD-10-CM

## 2024-08-11 NOTE — Telephone Encounter (Signed)
 Elizabeth Roman is scheduled for 2/6 at 10:00 am with Dr. Masciello

## 2024-09-03 ENCOUNTER — Institutional Professional Consult (permissible substitution) (INDEPENDENT_AMBULATORY_CARE_PROVIDER_SITE_OTHER)

## 2024-09-06 ENCOUNTER — Ambulatory Visit: Admitting: Allergy

## 2024-09-13 ENCOUNTER — Ambulatory Visit: Payer: Self-pay | Admitting: Internal Medicine

## 2024-10-20 ENCOUNTER — Ambulatory Visit: Admitting: Pulmonary Disease

## 2024-10-20 ENCOUNTER — Encounter
